# Patient Record
Sex: Female | Born: 1975 | Race: Black or African American | Hispanic: No | Marital: Single | Smoking: Never smoker
Health system: Southern US, Community
[De-identification: ages and names within clinical notes are randomized; demographics above are authoritative.]

## PROBLEM LIST (undated history)

## (undated) DIAGNOSIS — C801 Malignant (primary) neoplasm, unspecified: Secondary | ICD-10-CM

## (undated) DIAGNOSIS — D649 Anemia, unspecified: Secondary | ICD-10-CM

## (undated) HISTORY — PX: WISDOM TOOTH EXTRACTION: SHX21

## (undated) HISTORY — PX: THERAPEUTIC ABORTION: SHX798

## (undated) HISTORY — PX: BREAST BIOPSY: SHX20

---

## 1998-08-17 ENCOUNTER — Ambulatory Visit (HOSPITAL_COMMUNITY): Admission: RE | Admit: 1998-08-17 | Discharge: 1998-08-17 | Payer: Self-pay | Admitting: Dermatology

## 2002-03-11 ENCOUNTER — Other Ambulatory Visit: Admission: RE | Admit: 2002-03-11 | Discharge: 2002-03-11 | Payer: Self-pay | Admitting: Obstetrics and Gynecology

## 2003-03-14 ENCOUNTER — Other Ambulatory Visit: Admission: RE | Admit: 2003-03-14 | Discharge: 2003-03-14 | Payer: Self-pay | Admitting: Obstetrics and Gynecology

## 2003-08-19 ENCOUNTER — Inpatient Hospital Stay (HOSPITAL_COMMUNITY): Admission: AD | Admit: 2003-08-19 | Discharge: 2003-08-19 | Payer: Self-pay | Admitting: Obstetrics and Gynecology

## 2003-08-20 ENCOUNTER — Inpatient Hospital Stay (HOSPITAL_COMMUNITY): Admission: AD | Admit: 2003-08-20 | Discharge: 2003-08-20 | Payer: Self-pay | Admitting: Obstetrics & Gynecology

## 2003-10-19 ENCOUNTER — Inpatient Hospital Stay (HOSPITAL_COMMUNITY): Admission: AD | Admit: 2003-10-19 | Discharge: 2003-10-21 | Payer: Self-pay | Admitting: Obstetrics and Gynecology

## 2003-12-01 ENCOUNTER — Other Ambulatory Visit: Admission: RE | Admit: 2003-12-01 | Discharge: 2003-12-01 | Payer: Self-pay | Admitting: Obstetrics and Gynecology

## 2004-02-19 ENCOUNTER — Emergency Department (HOSPITAL_COMMUNITY): Admission: AD | Admit: 2004-02-19 | Discharge: 2004-02-19 | Payer: Self-pay | Admitting: Family Medicine

## 2005-11-18 ENCOUNTER — Ambulatory Visit: Payer: Self-pay | Admitting: Internal Medicine

## 2014-11-16 ENCOUNTER — Ambulatory Visit
Admission: RE | Admit: 2014-11-16 | Discharge: 2014-11-16 | Disposition: A | Discharge status: Home / Self Care | Payer: 59 | Source: Ambulatory Visit | Attending: Family Medicine | Admitting: Family Medicine

## 2014-11-16 ENCOUNTER — Other Ambulatory Visit: Payer: Self-pay | Admitting: Family Medicine

## 2014-11-16 DIAGNOSIS — M542 Cervicalgia: Secondary | ICD-10-CM

## 2014-11-16 DIAGNOSIS — M25512 Pain in left shoulder: Secondary | ICD-10-CM

## 2014-11-16 IMAGING — CR DG CERVICAL SPINE COMPLETE 4+V
6 series · 6 of 6 positions shown · non-contrast
Comparison: None.

CLINICAL DATA: Two weeks of neck and left arm pain and tingling
which began after workout at the [employer]

EXAM:
CERVICAL SPINE  4+ VIEWS

[view not recorded (1 of 6)]
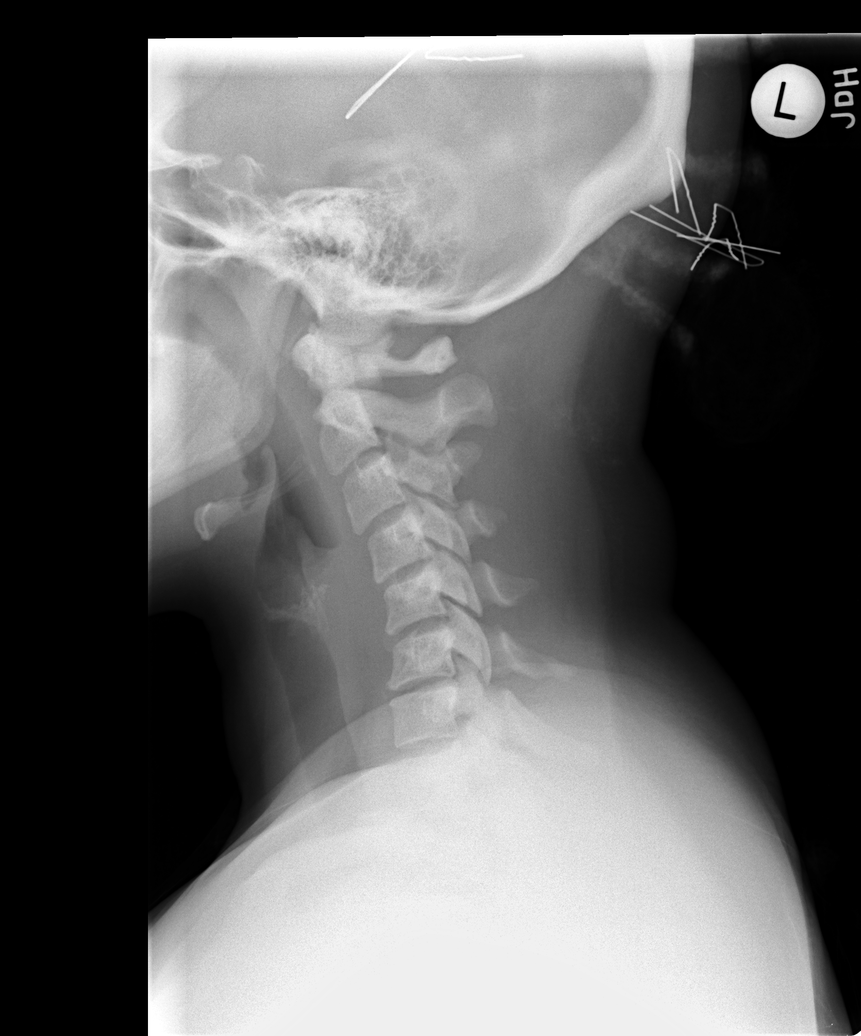

[view not recorded (2 of 6)]
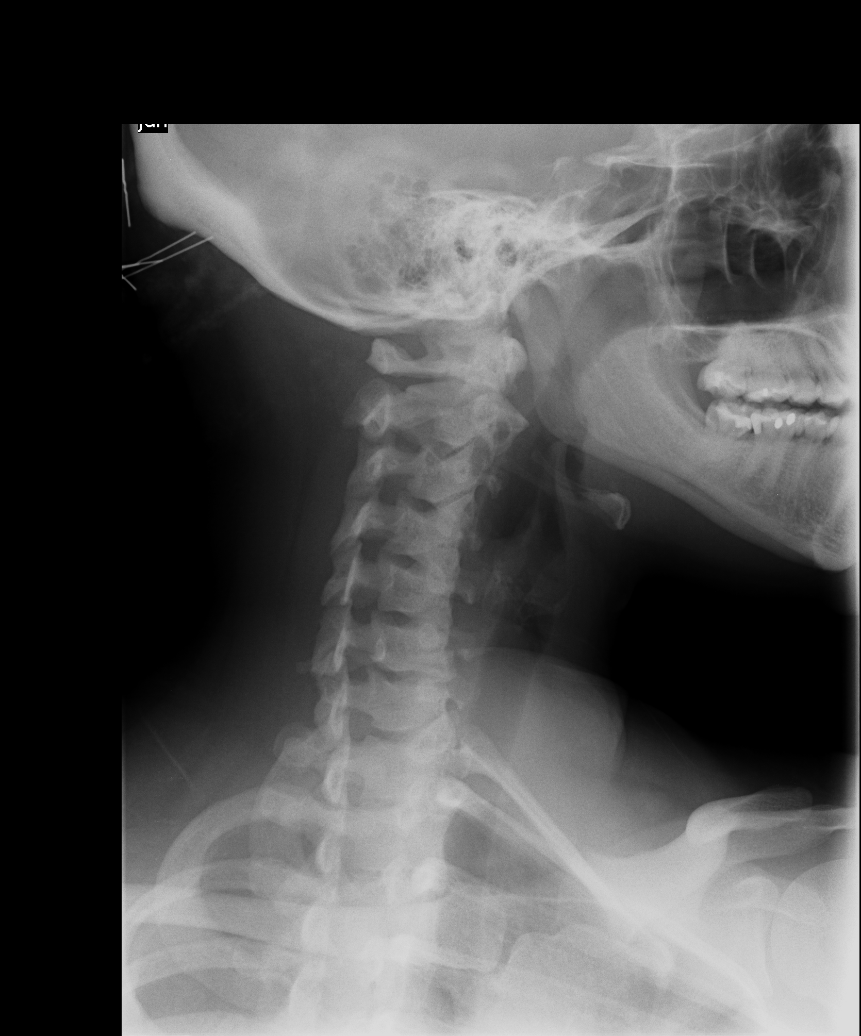

[view not recorded (3 of 6)]
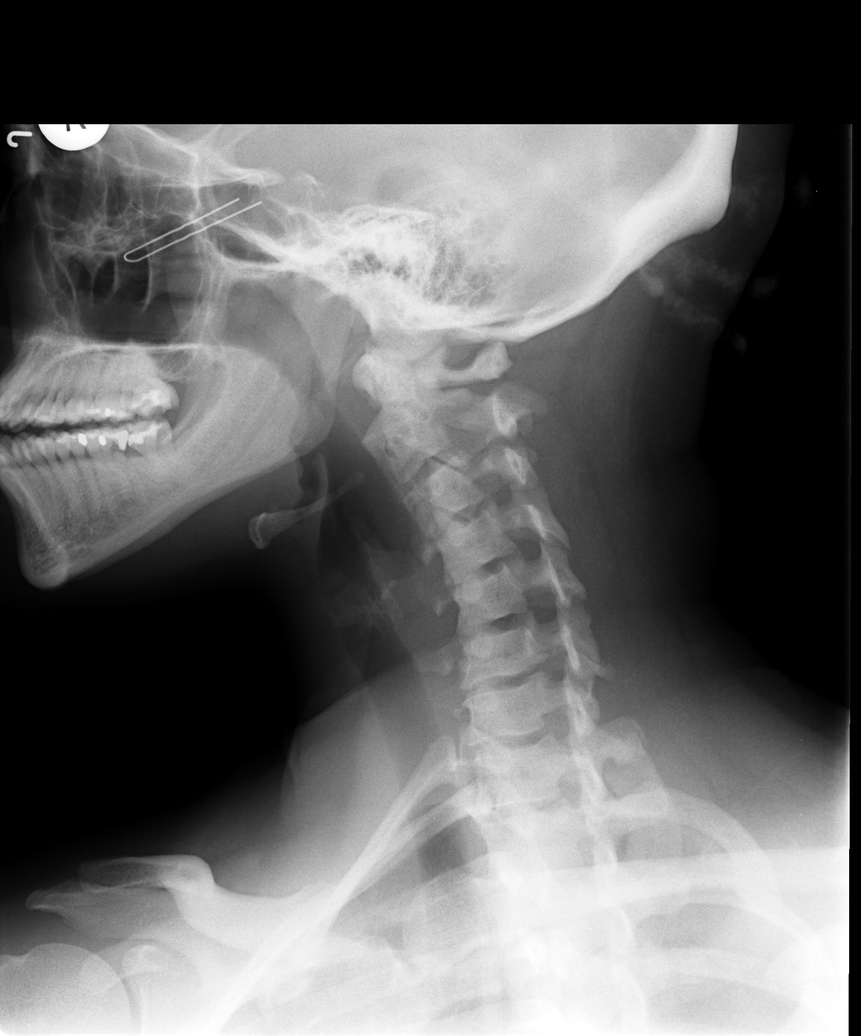

[view not recorded (4 of 6)]
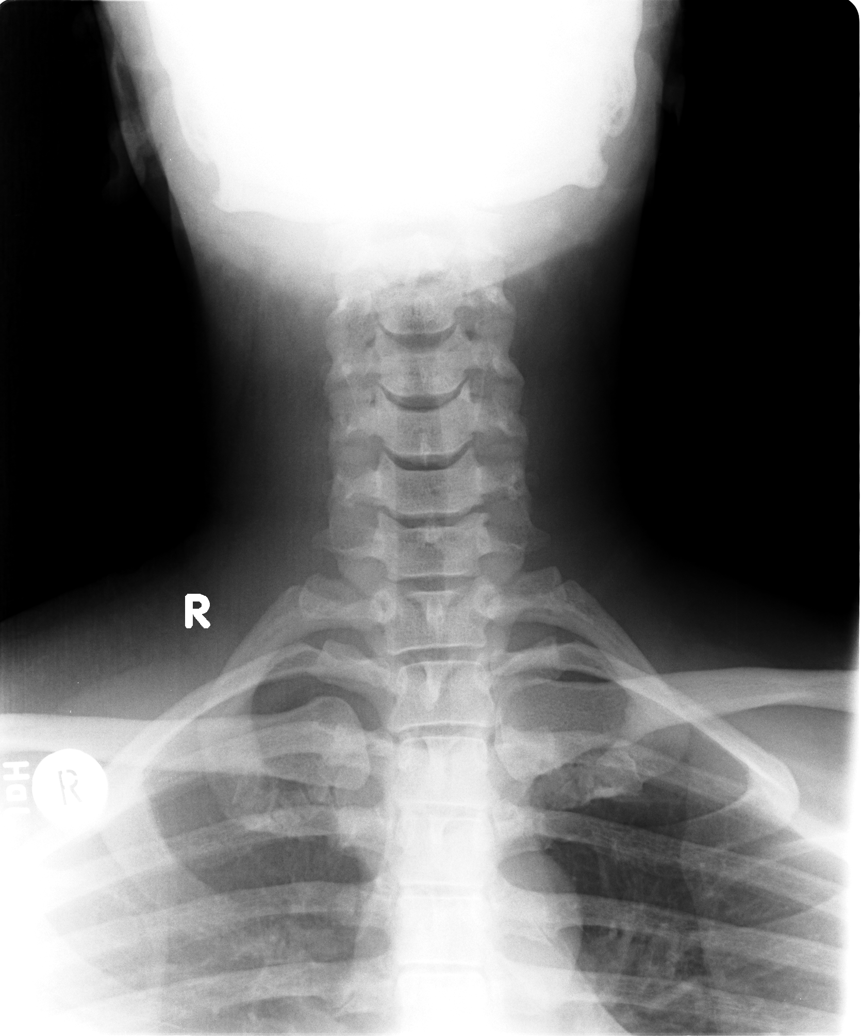

[view not recorded (5 of 6)]
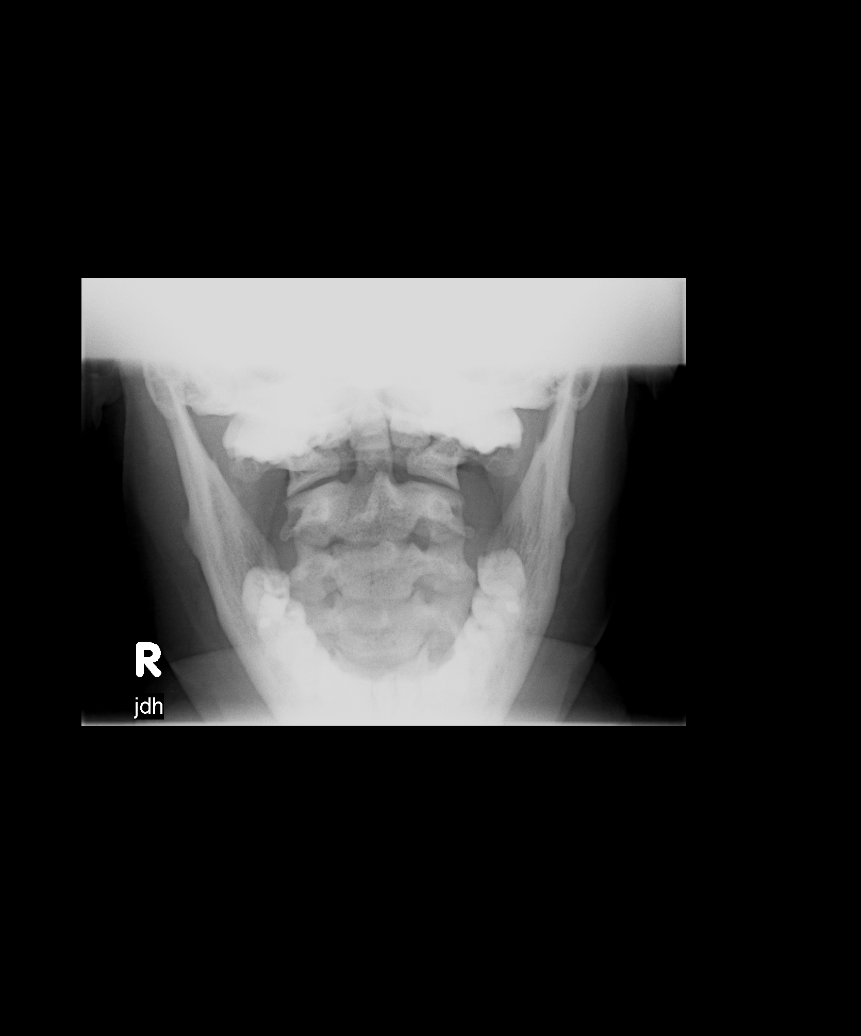

[view not recorded (6 of 6)]
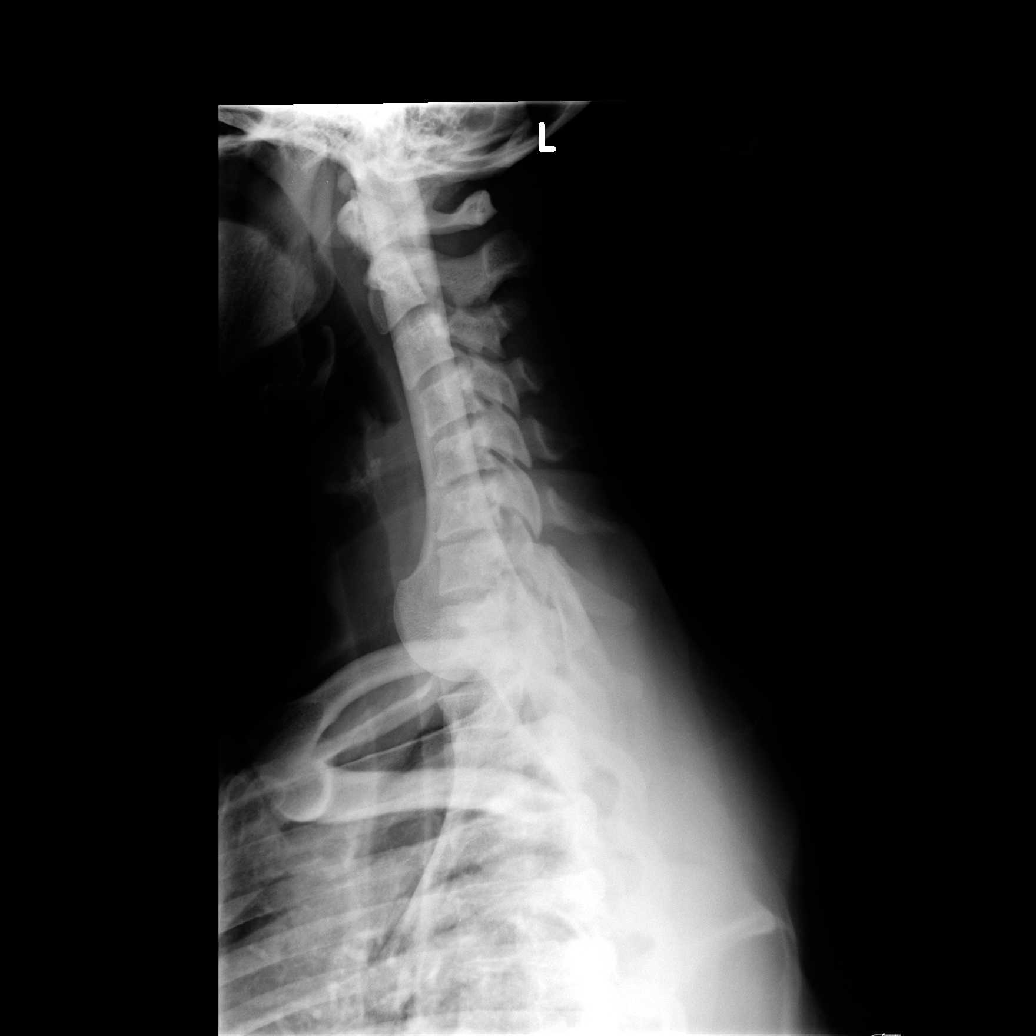

[6 of 6 positions shown; findings below may reference images not displayed]

FINDINGS: There is marked reversal of the normal cervical lordosis. The
vertebral bodies are reasonably well maintained in height. There is
no perched facet. There is no acute fracture. The prevertebral soft
tissue spaces are normal. The oblique views reveal no bony
encroachment upon the neural foramina. The odontoid is intact.
IMPRESSION: Reversal of the normal cervical lordosis is consistent with muscle
spasm. There is no acute bony abnormality. There is no high-grade
disc space narrowing.

## 2016-05-30 ENCOUNTER — Other Ambulatory Visit: Payer: Self-pay | Admitting: Obstetrics and Gynecology

## 2016-05-30 DIAGNOSIS — R928 Other abnormal and inconclusive findings on diagnostic imaging of breast: Secondary | ICD-10-CM

## 2016-06-07 ENCOUNTER — Ambulatory Visit
Admission: RE | Admit: 2016-06-07 | Discharge: 2016-06-07 | Disposition: A | Discharge status: Home / Self Care | Payer: Self-pay | Source: Ambulatory Visit | Attending: Obstetrics and Gynecology | Admitting: Obstetrics and Gynecology

## 2016-06-07 DIAGNOSIS — R928 Other abnormal and inconclusive findings on diagnostic imaging of breast: Secondary | ICD-10-CM

## 2016-06-07 IMAGING — US ULTRASOUND LEFT BREAST LIMITED
1 series · 6 of 6 positions shown · non-contrast
Comparison: [DATE]

CLINICAL DATA: The patient returns after baseline screening study
for evaluation of possible bilateral breast masses.

EXAM:
2D DIGITAL DIAGNOSTIC BILATERAL MAMMOGRAM WITH CAD AND ADJUNCT TOMO
ULTRASOUND BILATERAL BREAST

[Series 1: ultrasound left breast limited · 0.06mm/px · 6 of 6 slices shown]
[im 1/6]
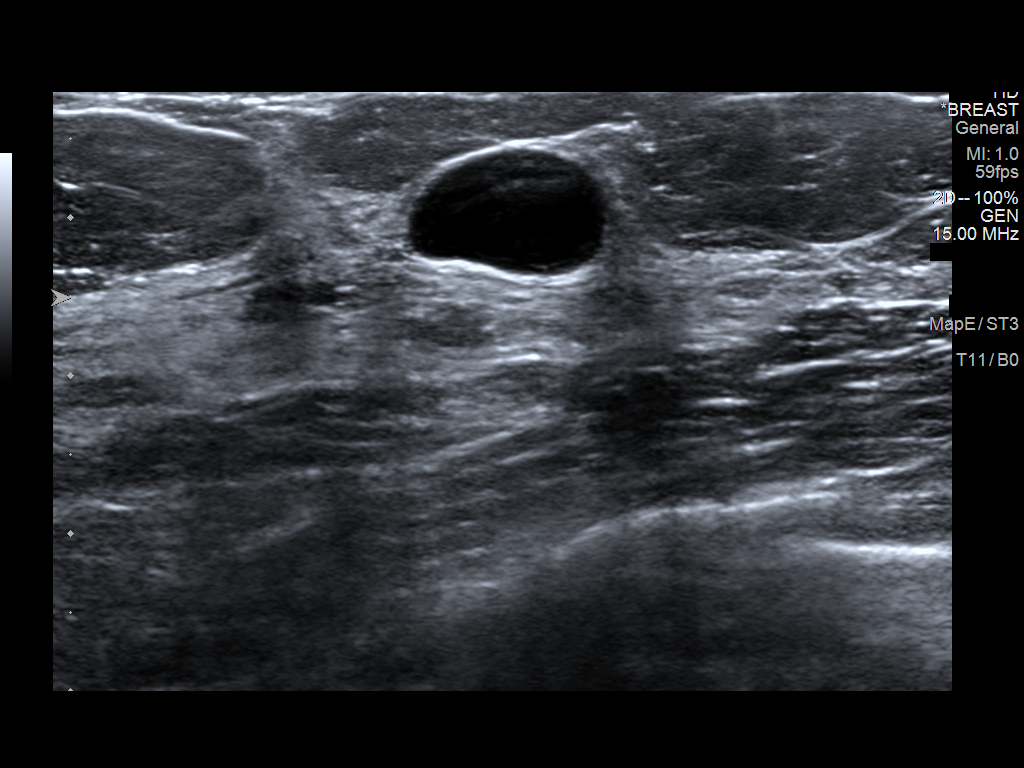
[im 2/6]
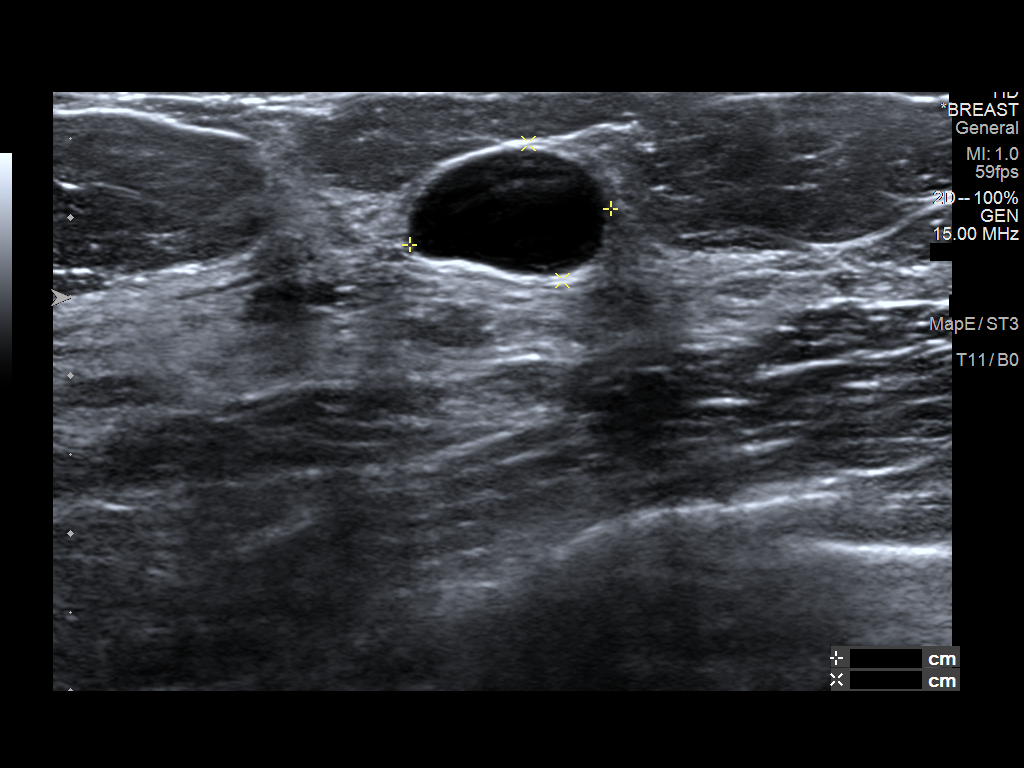
[im 3/6]
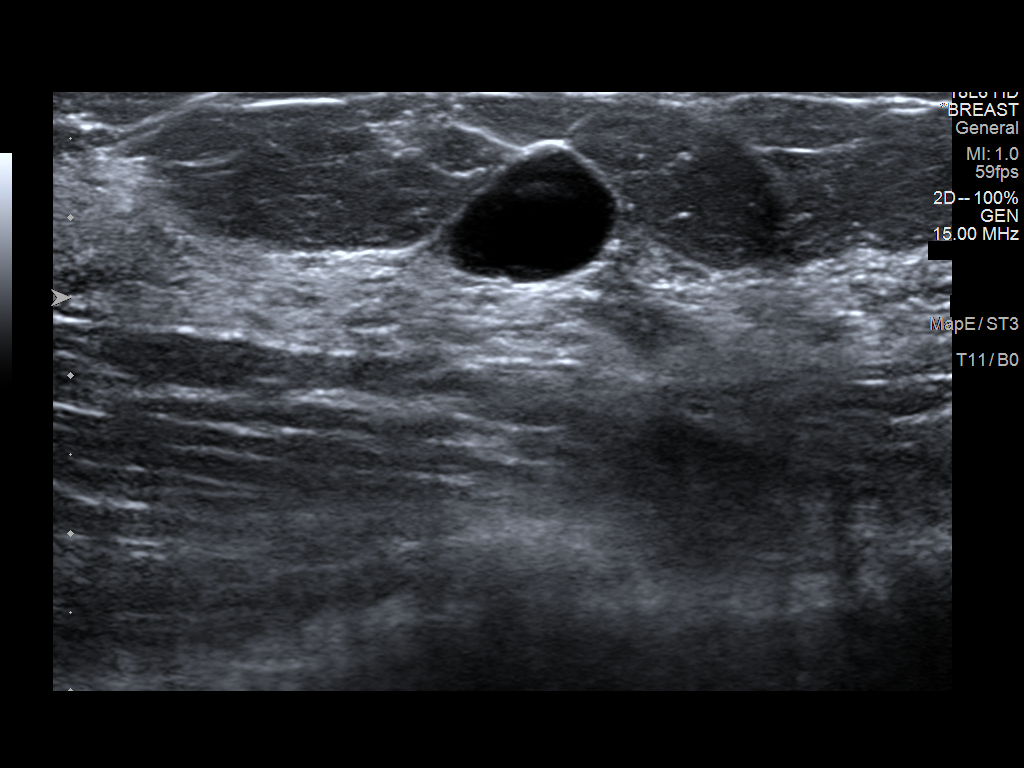
[im 4/6]
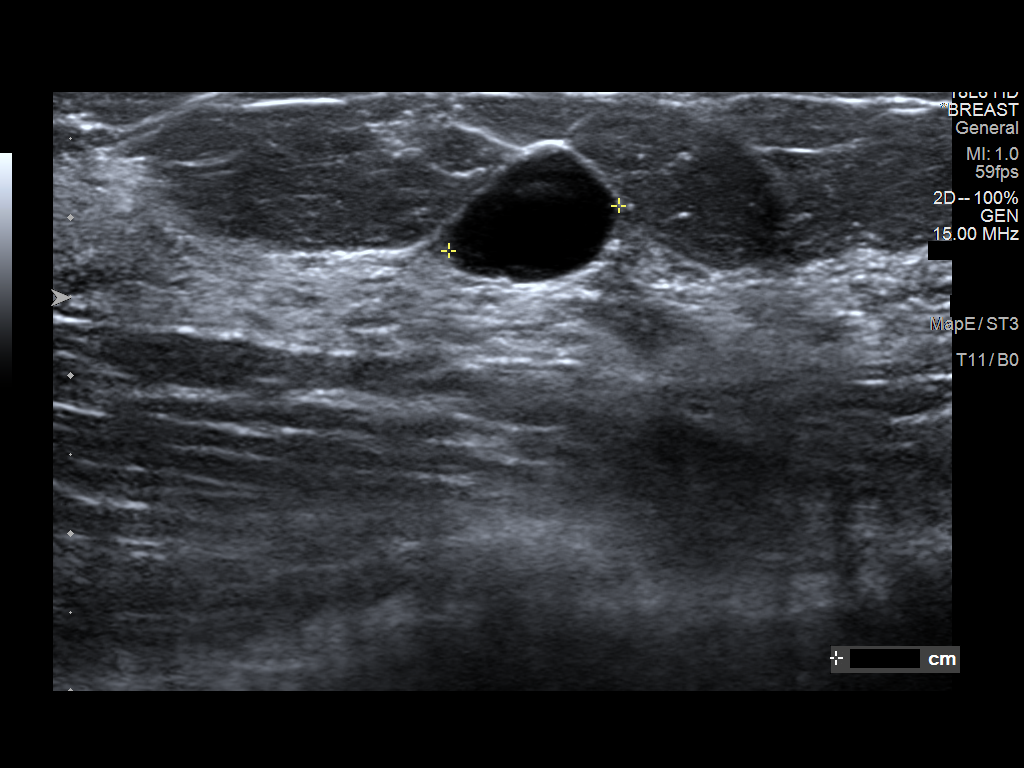
[im 5/6]
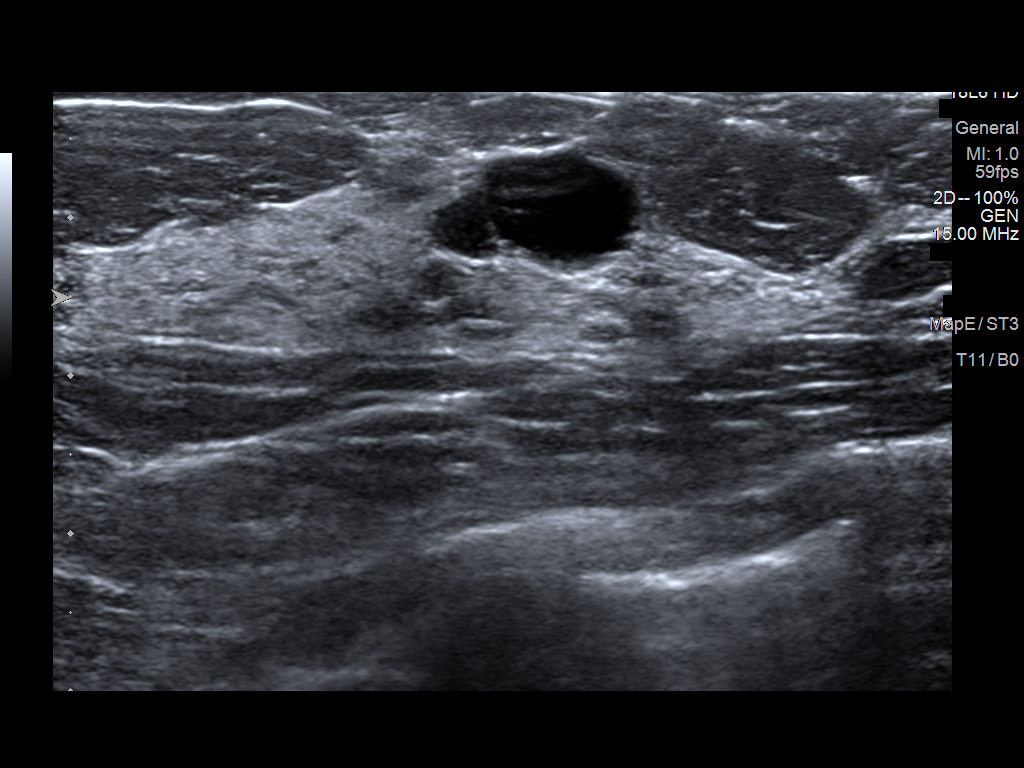
[im 6/6]
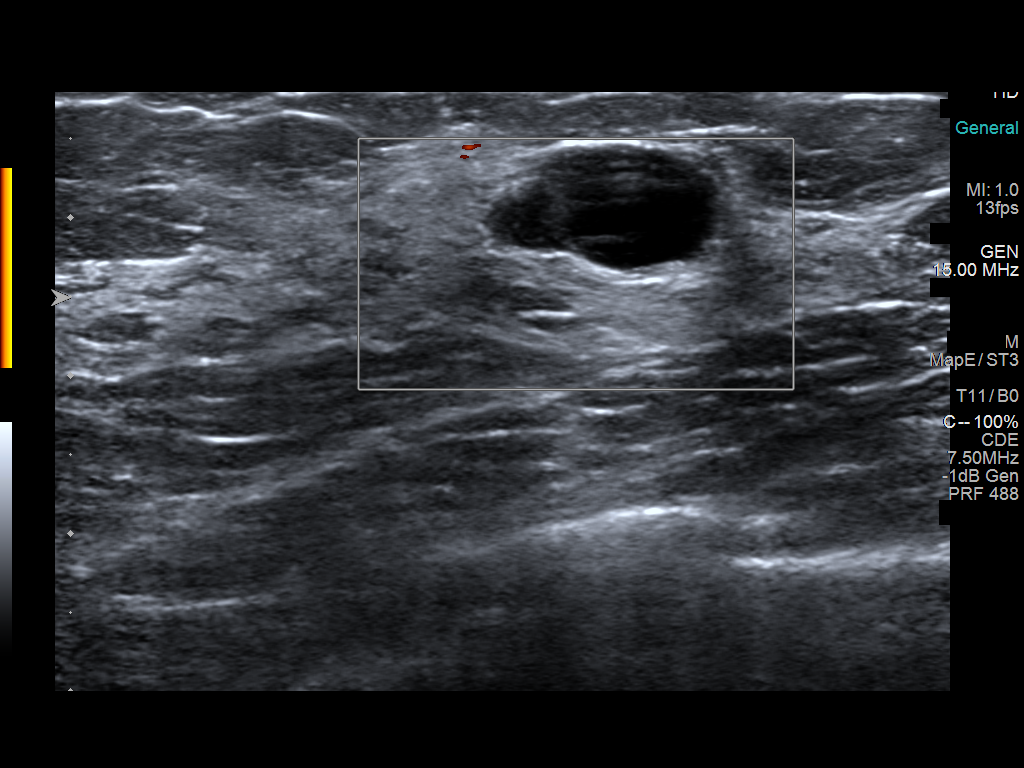

[6 of 6 positions shown; findings below may reference images not displayed]

ACR Breast Density Category c: The breast tissue is heterogeneously
dense, which may obscure small masses.
FINDINGS: Additional views are performed confirming presence of circumscribed
masses in the upper-outer quadrant of the right breast in the
lateral central portion of the left breast.

Mammographic images were processed with CAD.

On physical exam, I palpate no abnormality in the lateral aspect of
the right breast. I palpate no abnormality in the lateral aspect of
the left breast.

Targeted ultrasound is performed, showing a simple cyst in the 9
o'clock location of the right breast 4 cm from the nipple which
measures 1.0 x 0.7 x 0.8 cm. In the left breast 4 o'clock location 6
cm from nipple, a simple cyst is 1.1 by 1.3 x 0.9 cm. Single
internal septation is present. No solid mass or acoustic shadowing
identified in the areas evaluated.
IMPRESSION: Bilateral simple cysts. No mammographic or ultrasound evidence for
malignancy.

RECOMMENDATION:
Screening mammogram in one year.(Code:[E4])

I have discussed the findings and recommendations with the patient.
Results were also provided in writing at the conclusion of the
visit. If applicable, a reminder letter will be sent to the patient
regarding the next appointment.

BI-RADS CATEGORY  2: Benign.

## 2016-06-07 IMAGING — MG 2D DIGITAL DIAGNOSTIC BILATERAL MAMMOGRAM WITH CAD AND ADJUNCT T
8 of 12 series · 8 of 28 positions shown · non-contrast
Comparison: [DATE]

CLINICAL DATA: The patient returns after baseline screening study
for evaluation of possible bilateral breast masses.

EXAM:
2D DIGITAL DIAGNOSTIC BILATERAL MAMMOGRAM WITH CAD AND ADJUNCT TOMO
ULTRASOUND BILATERAL BREAST

[R MLO]
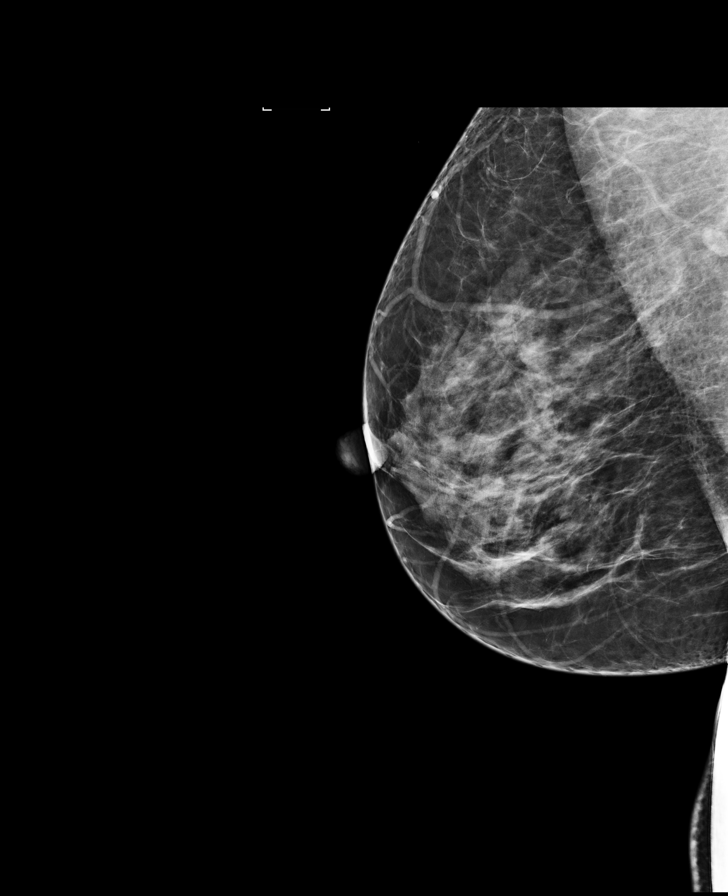

[R CC]
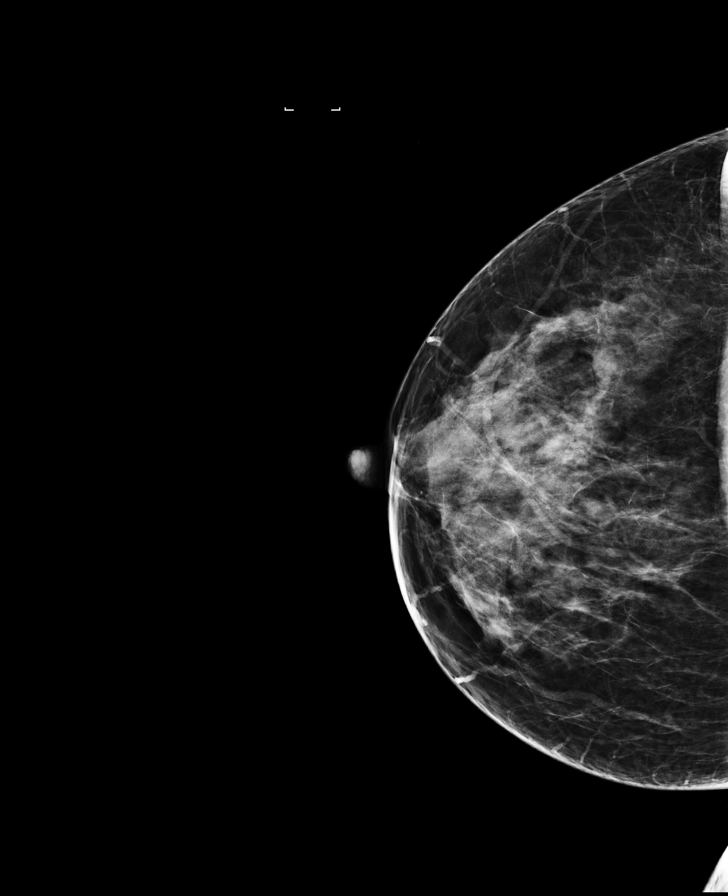

[R MLO synth-2D]
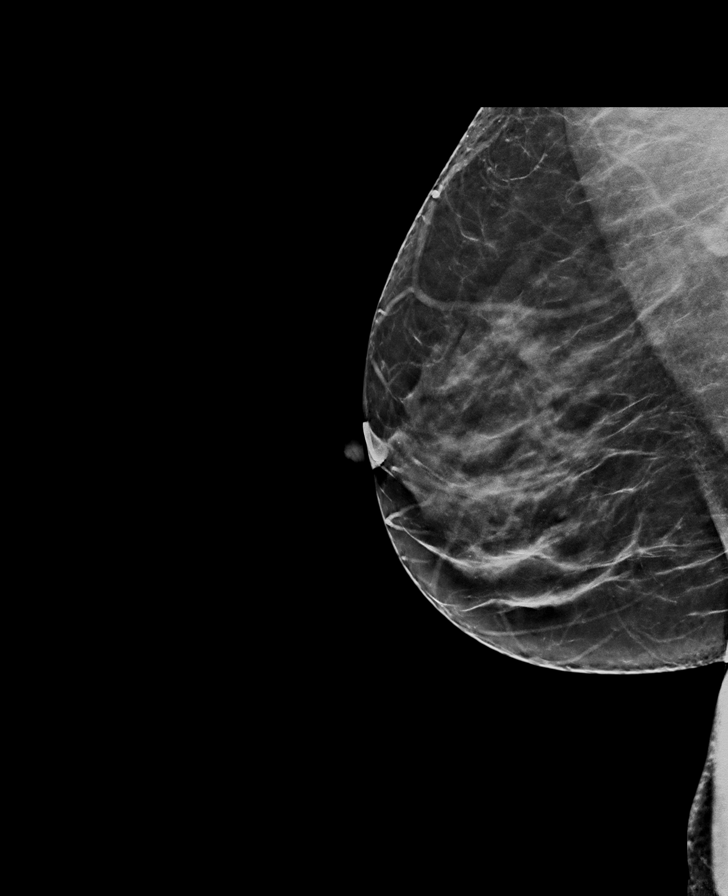

[R CC synth-2D]
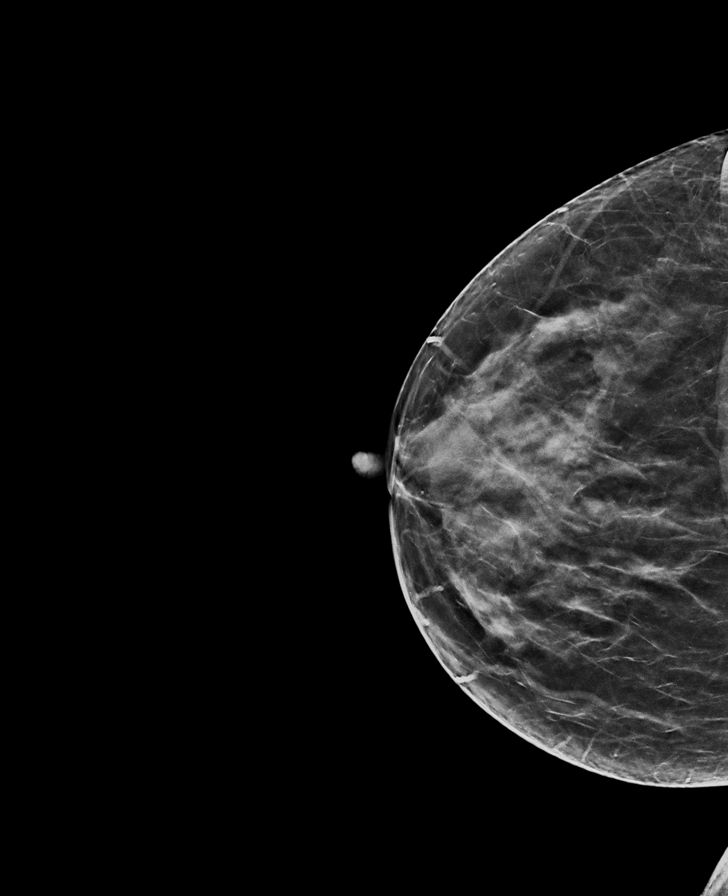

[L CC]
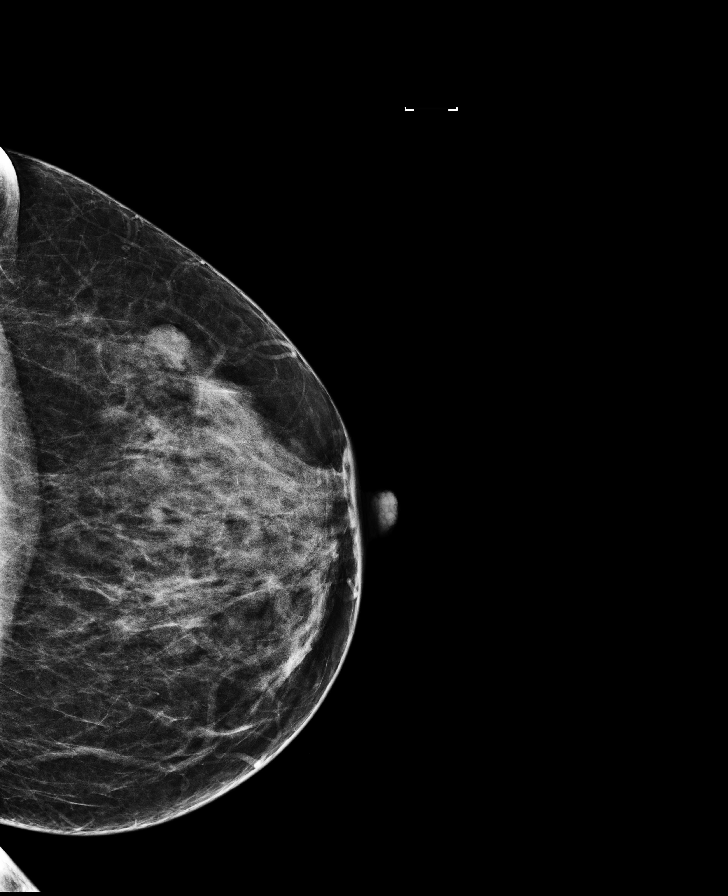

[L MLO]
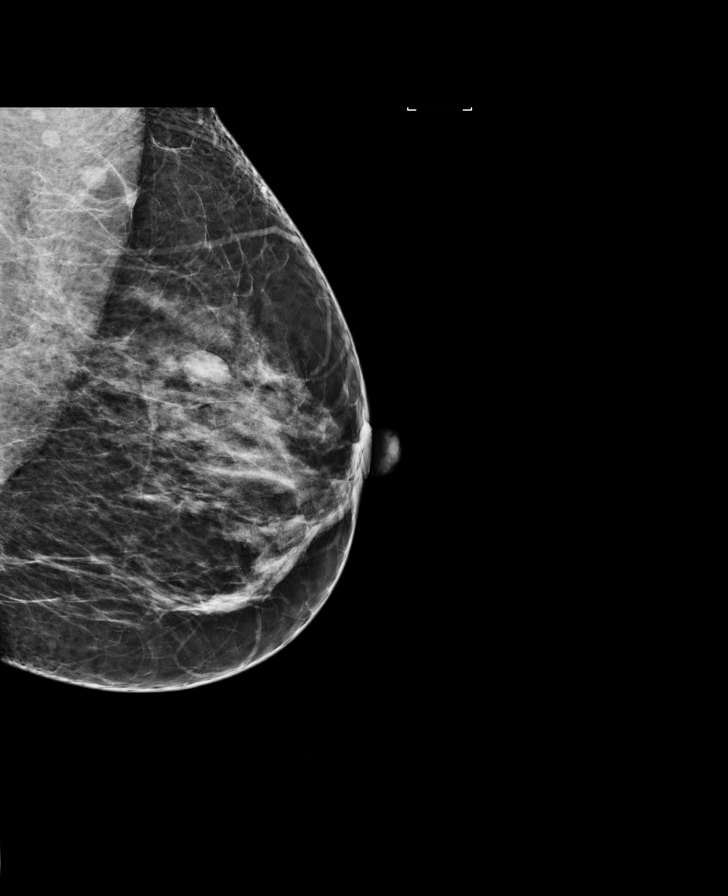

[L CC synth-2D]
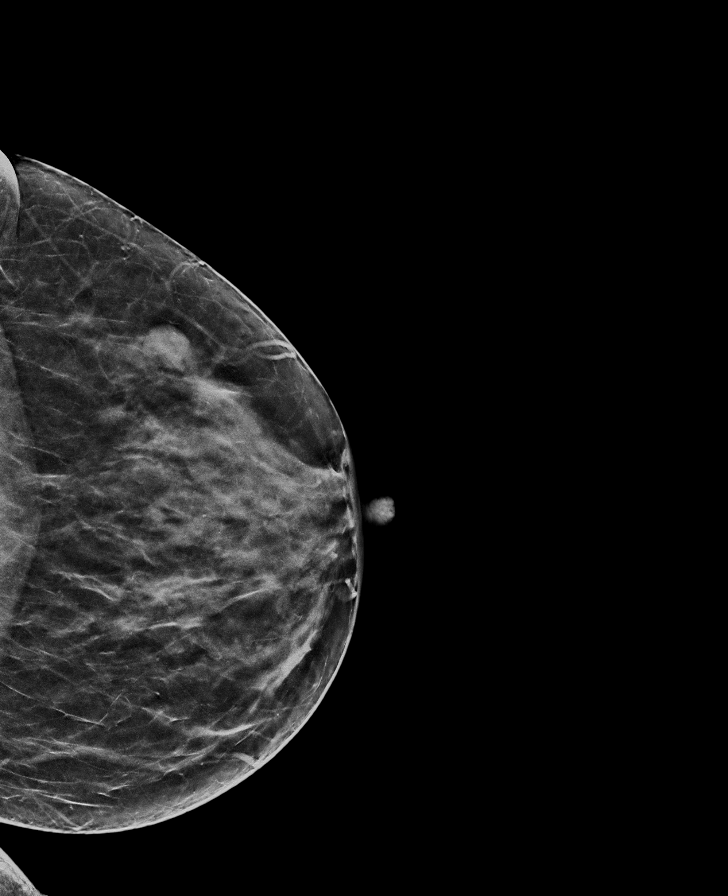

[L MLO synth-2D]
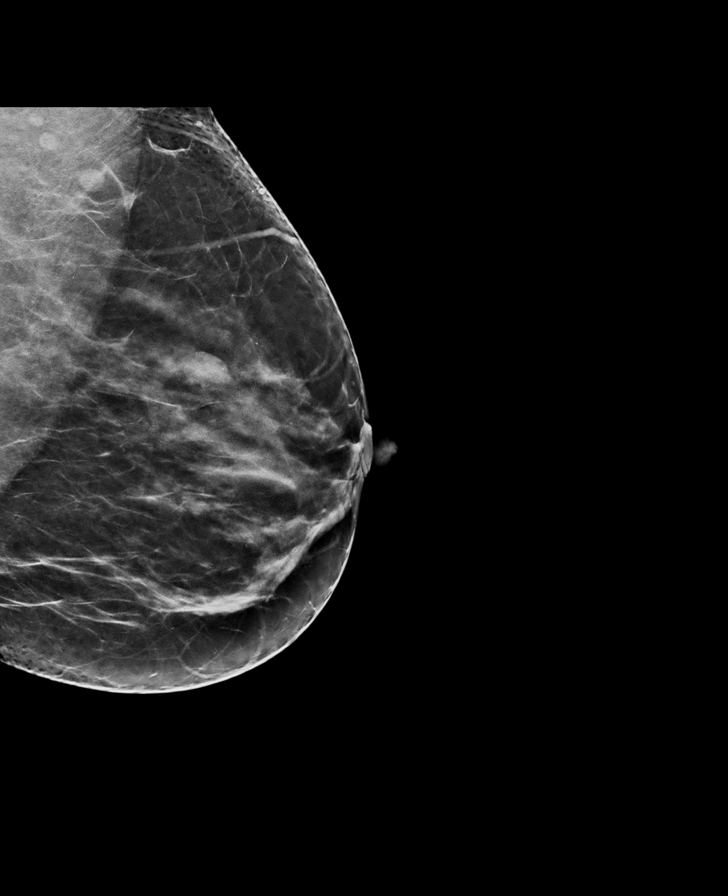

[8 of 28 positions shown; findings below may reference images not displayed]

ACR Breast Density Category c: The breast tissue is heterogeneously
dense, which may obscure small masses.
FINDINGS: Additional views are performed confirming presence of circumscribed
masses in the upper-outer quadrant of the right breast in the
lateral central portion of the left breast.

Mammographic images were processed with CAD.

On physical exam, I palpate no abnormality in the lateral aspect of
the right breast. I palpate no abnormality in the lateral aspect of
the left breast.

Targeted ultrasound is performed, showing a simple cyst in the 9
o'clock location of the right breast 4 cm from the nipple which
measures 1.0 x 0.7 x 0.8 cm. In the left breast 4 o'clock location 6
cm from nipple, a simple cyst is 1.1 by 1.3 x 0.9 cm. Single
internal septation is present. No solid mass or acoustic shadowing
identified in the areas evaluated.
IMPRESSION: Bilateral simple cysts. No mammographic or ultrasound evidence for
malignancy.

RECOMMENDATION:
Screening mammogram in one year.(Code:[E4])

I have discussed the findings and recommendations with the patient.
Results were also provided in writing at the conclusion of the
visit. If applicable, a reminder letter will be sent to the patient
regarding the next appointment.

BI-RADS CATEGORY  2: Benign.

## 2019-07-30 ENCOUNTER — Other Ambulatory Visit: Payer: Self-pay | Admitting: Obstetrics and Gynecology

## 2019-07-30 DIAGNOSIS — R928 Other abnormal and inconclusive findings on diagnostic imaging of breast: Secondary | ICD-10-CM

## 2019-08-05 ENCOUNTER — Other Ambulatory Visit: Payer: Self-pay

## 2019-08-05 ENCOUNTER — Ambulatory Visit
Admission: RE | Admit: 2019-08-05 | Discharge: 2019-08-05 | Disposition: A | Discharge status: Home / Self Care | Payer: Self-pay | Source: Ambulatory Visit | Attending: Obstetrics and Gynecology | Admitting: Obstetrics and Gynecology

## 2019-08-05 DIAGNOSIS — R928 Other abnormal and inconclusive findings on diagnostic imaging of breast: Secondary | ICD-10-CM

## 2019-08-05 IMAGING — US ULTRASOUND LEFT BREAST LIMITED
1 series · 5 of 5 positions shown · non-contrast
Comparison: Previous exam(s).

CLINICAL DATA: Screening recall for a possible left breast mass.

EXAM:
ULTRASOUND OF THE LEFT BREAST

[Series 1: ultrasound left breast limited · 0.07mm/px · 5 of 5 slices shown]
[im 1/5]
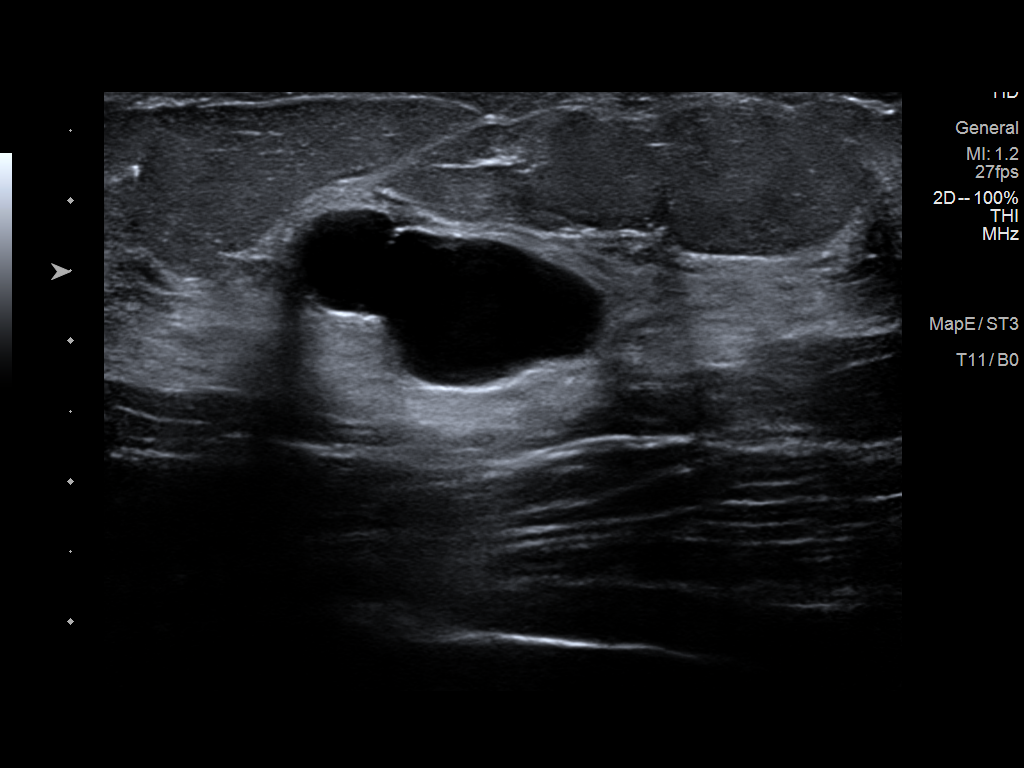
[im 2/5]
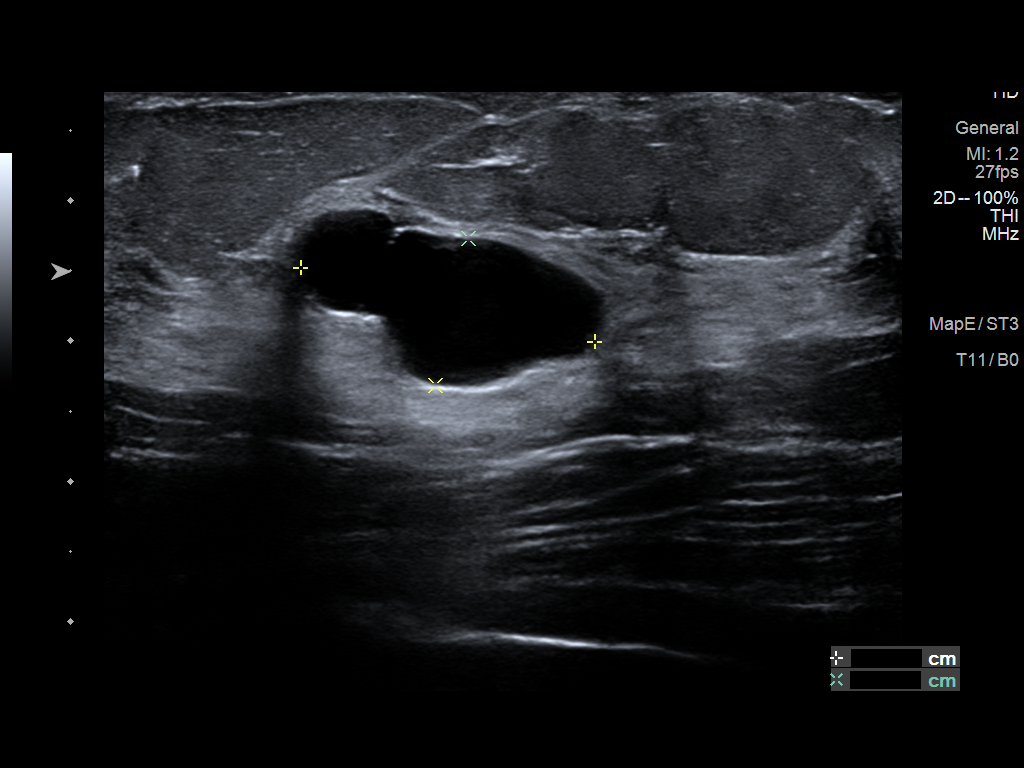
[im 3/5]
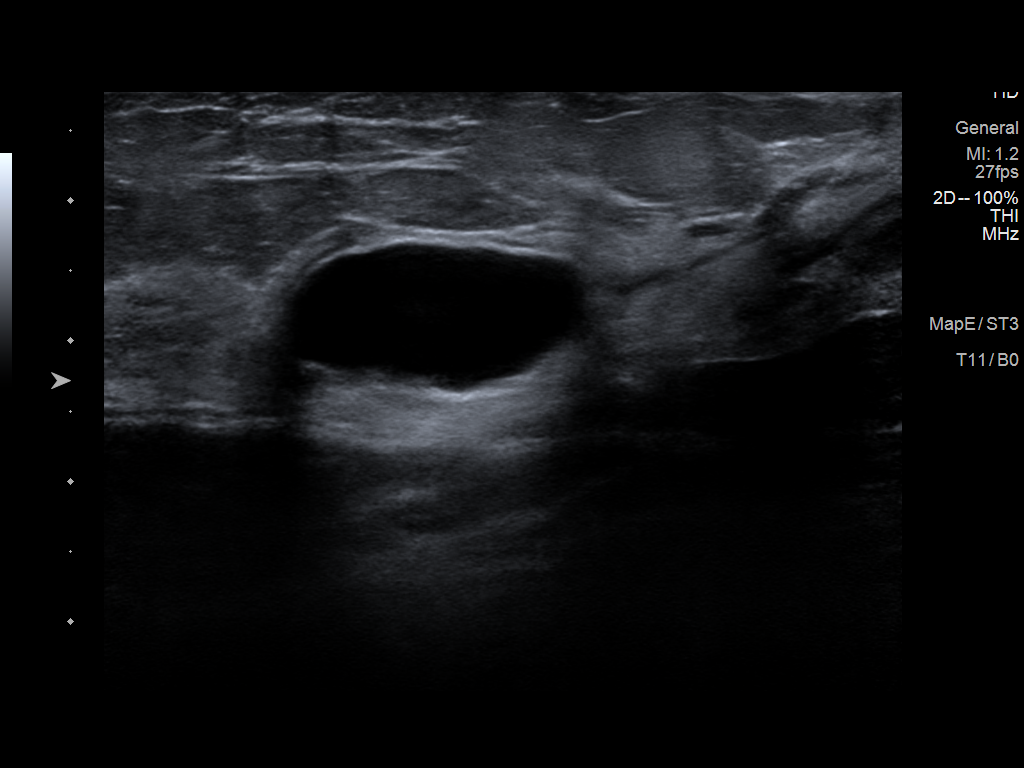
[im 4/5]
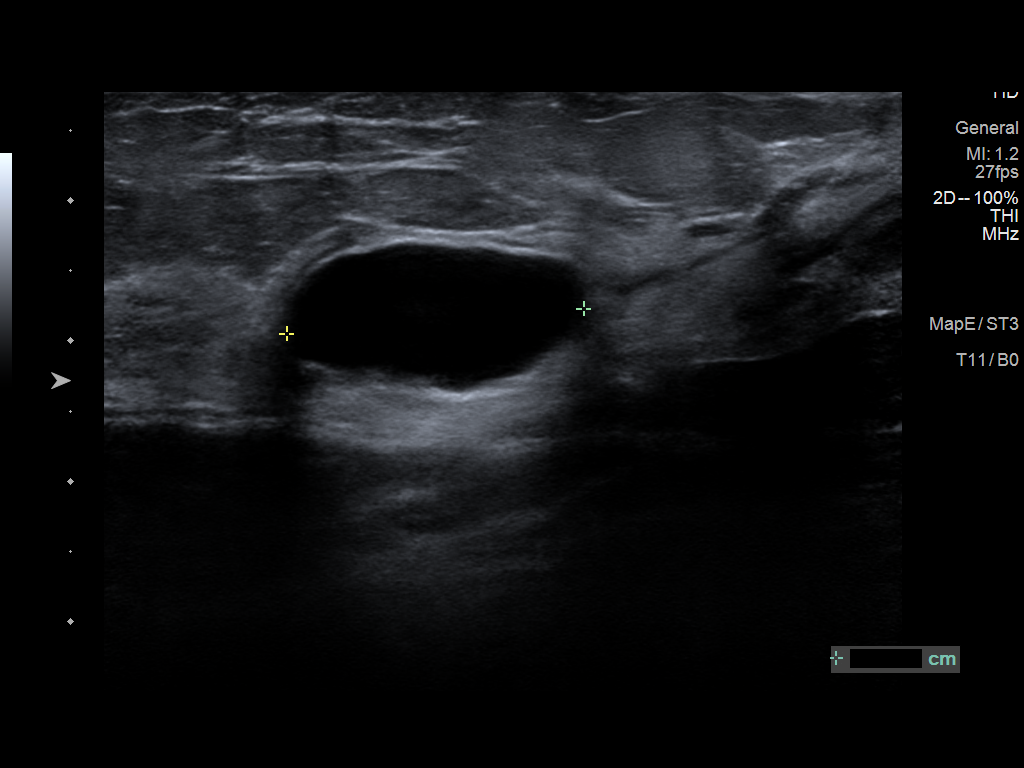
[im 5/5]
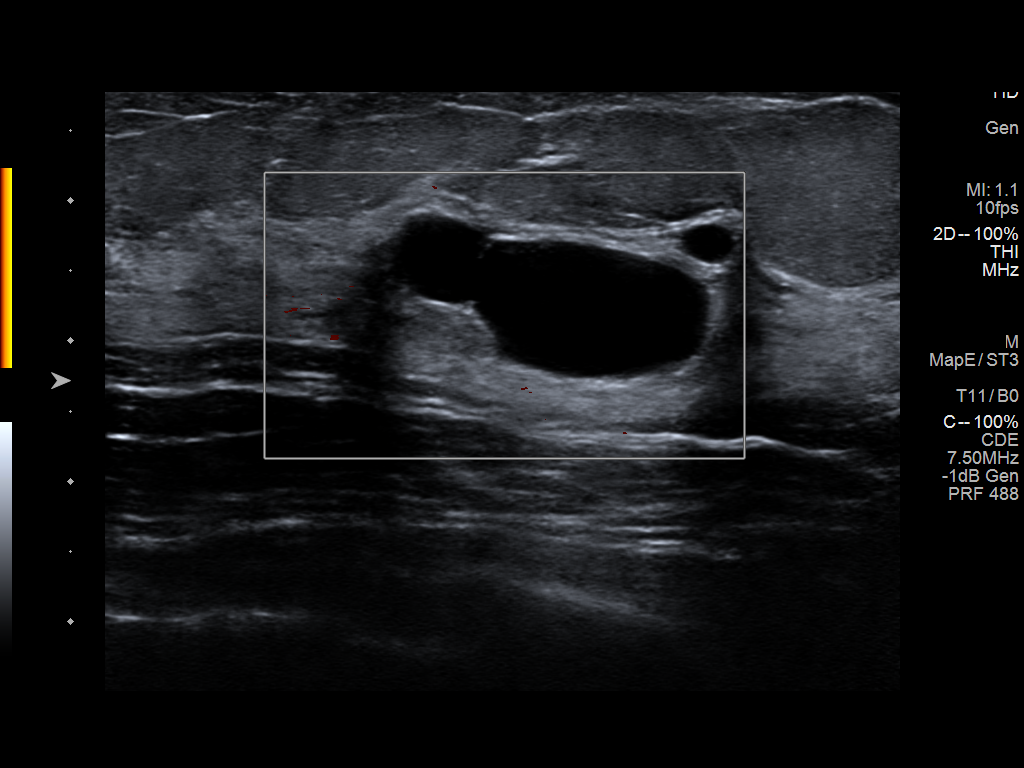

[5 of 5 positions shown; findings below may reference images not displayed]

FINDINGS: Targeted ultrasound is performed, showing a simple oval mildly
lobulated cyst in the left breast at 11:30 o'clock, 3 cm the nipple,
measuring 2.2 x 1.1 x 2.1 cm. This accounts for the mammographic
mass. There are no solid masses or suspicious lesions.
IMPRESSION: 1. No evidence of breast malignancy.
2. Benign left breast cyst.

RECOMMENDATION:
Screening mammogram in one year.(Code:[9G])

I have discussed the findings and recommendations with the patient.
Results were also provided in writing at the conclusion of the
visit. If applicable, a reminder letter will be sent to the patient
regarding the next appointment.

BI-RADS CATEGORY  2: Benign.

## 2019-10-28 DIAGNOSIS — N871 Moderate cervical dysplasia: Secondary | ICD-10-CM | POA: Insufficient documentation

## 2020-03-31 ENCOUNTER — Ambulatory Visit: Payer: BC Managed Care – PPO | Attending: Internal Medicine

## 2020-03-31 DIAGNOSIS — Z23 Encounter for immunization: Secondary | ICD-10-CM

## 2020-03-31 NOTE — Progress Notes (Signed)
   Covid-19 Vaccination Clinic  Name:  Tonya Blair    MRN: [id number] DOB: 04/19/1976  03/31/2020  [name] was observed post Covid-19 immunization for 15 minutes without incident. She was provided with Vaccine Information Sheet and instruction to access the V-Safe system.   [name] was instructed to call 911 with any severe reactions post vaccine: Marland Kitchen Difficulty breathing  . Swelling of face and throat  . A fast heartbeat  . A bad rash all over body  . Dizziness and weakness   Immunizations Administered    Name Date Dose VIS Date Route   Pfizer COVID-19 Vaccine 03/31/2020  1:26 PM 0.3 mL 12/03/2019 Intramuscular   Manufacturer: [place]   Lot: [id number]   [place]: [id number]

## 2020-04-24 ENCOUNTER — Ambulatory Visit: Payer: BC Managed Care – PPO | Attending: Internal Medicine

## 2020-04-24 DIAGNOSIS — Z23 Encounter for immunization: Secondary | ICD-10-CM

## 2020-04-24 NOTE — Progress Notes (Signed)
   Covid-19 Vaccination Clinic  Name:  Tonya Blair    MRN: [id number] DOB: Jan 15, 1976  04/24/2020  [name] was observed post Covid-19 immunization for 15 minutes without incident. She was provided with Vaccine Information Sheet and instruction to access the V-Safe system.   [name] was instructed to call 911 with any severe reactions post vaccine: Marland Kitchen Difficulty breathing  . Swelling of face and throat  . A fast heartbeat  . A bad rash all over body  . Dizziness and weakness   Immunizations Administered    Name Date Dose VIS Date Route   Pfizer COVID-19 Vaccine 04/24/2020  4:56 PM 0.3 mL 02/16/2019 Intramuscular   Manufacturer: [place]   Lot: [id number]   [place]: [id number]

## 2021-09-21 ENCOUNTER — Other Ambulatory Visit: Payer: Self-pay | Admitting: Obstetrics

## 2021-09-21 DIAGNOSIS — R928 Other abnormal and inconclusive findings on diagnostic imaging of breast: Secondary | ICD-10-CM

## 2021-12-11 ENCOUNTER — Ambulatory Visit
Admission: RE | Admit: 2021-12-11 | Discharge: 2021-12-11 | Disposition: A | Discharge status: Home / Self Care | Payer: BC Managed Care – PPO | Source: Ambulatory Visit | Attending: Obstetrics | Admitting: Obstetrics

## 2021-12-11 ENCOUNTER — Other Ambulatory Visit: Payer: Self-pay | Admitting: Obstetrics

## 2021-12-11 DIAGNOSIS — R928 Other abnormal and inconclusive findings on diagnostic imaging of breast: Secondary | ICD-10-CM

## 2021-12-11 DIAGNOSIS — N6310 Unspecified lump in the right breast, unspecified quadrant: Secondary | ICD-10-CM

## 2021-12-11 IMAGING — US US BREAST*R* LIMITED INC AXILLA
1 series · 9 of 9 positions shown · non-contrast
Comparison: Previous exam(s).

CLINICAL DATA: Patient was recalled from screening mammogram for a
right breast mass.

EXAM:
DIGITAL DIAGNOSTIC UNILATERAL RIGHT MAMMOGRAM WITH TOMOSYNTHESIS AND
CAD; ULTRASOUND RIGHT BREAST LIMITED
TECHNIQUE: Right digital diagnostic mammography and breast tomosynthesis was
performed. The images were evaluated with computer-aided detection.;
Targeted ultrasound examination of the right breast was performed

[Series 1: us breast*right* limited inc axilla · 0.06mm/px · 9 acquisitions, 9 frames shown]
[im 1/9]
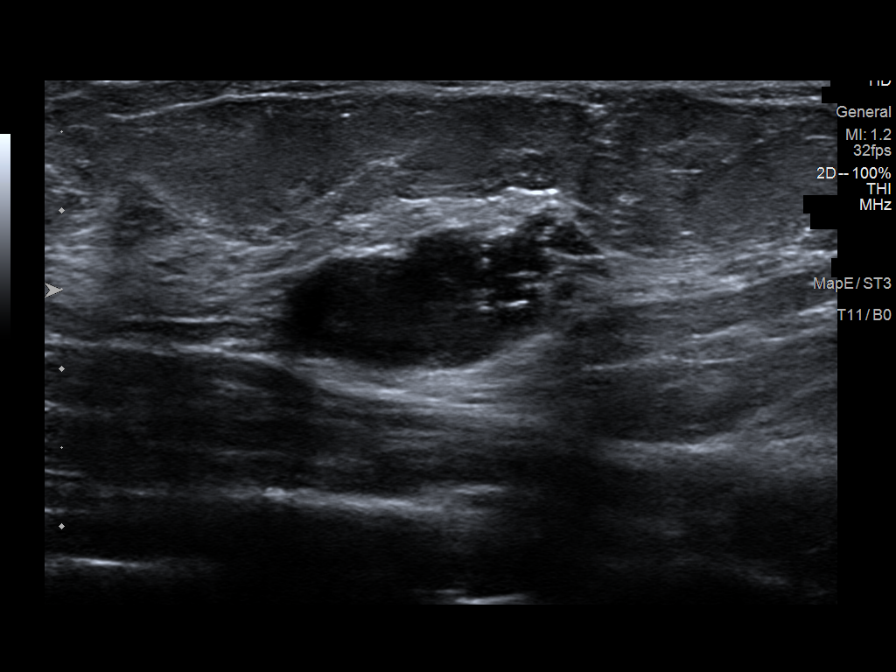
[im 2/9]
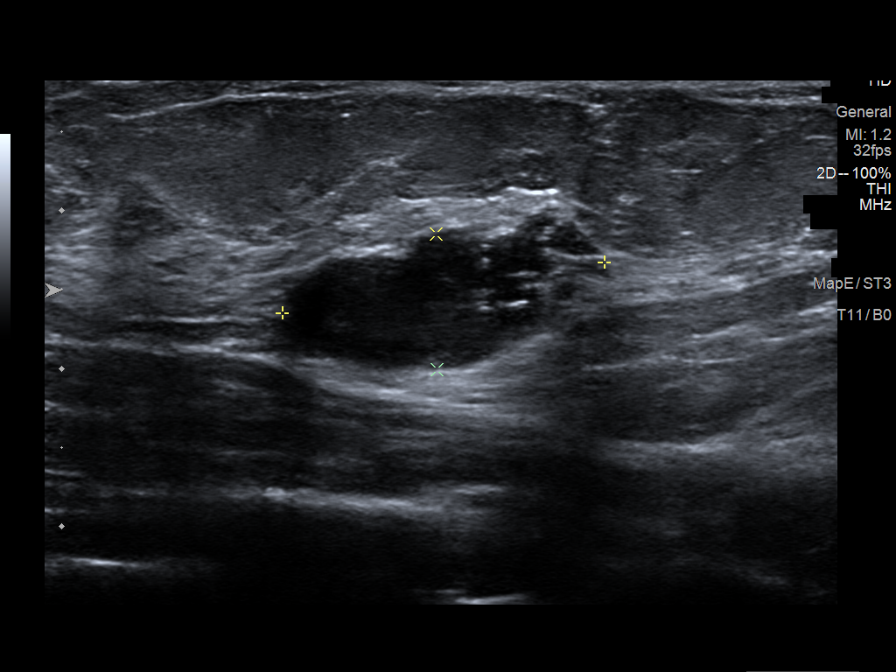
[im 3/9]
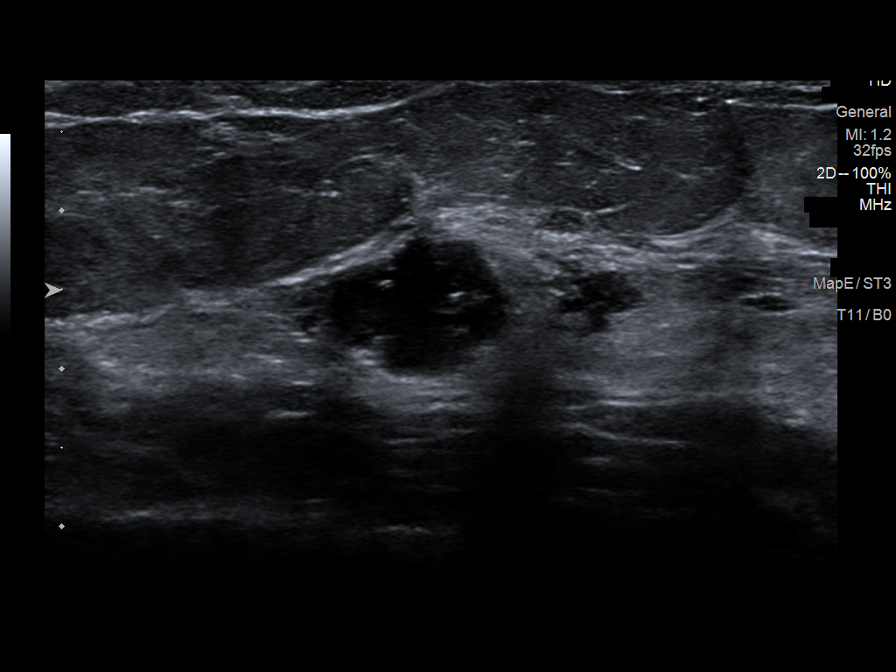
[im 4/9]
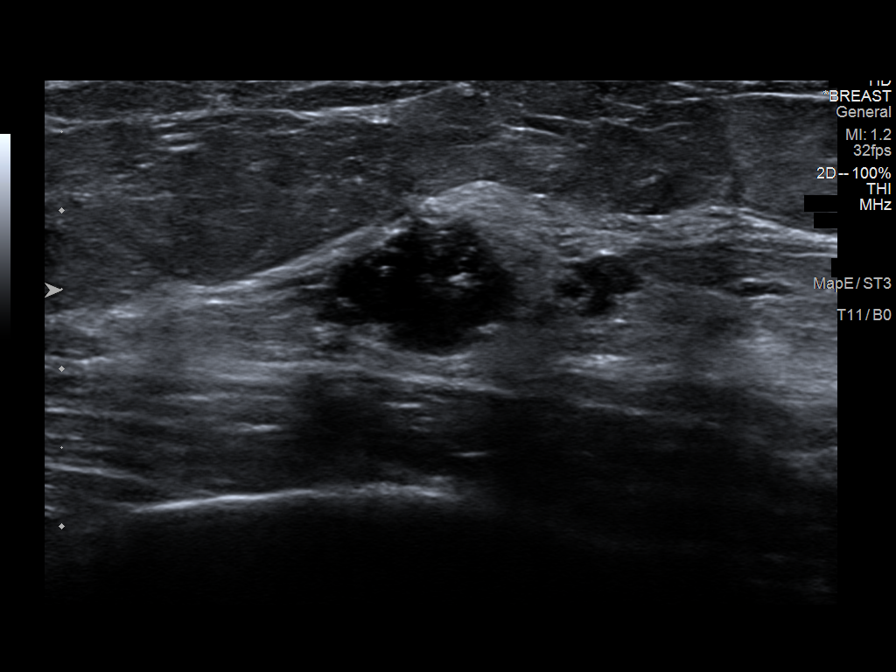
[im 5/9]
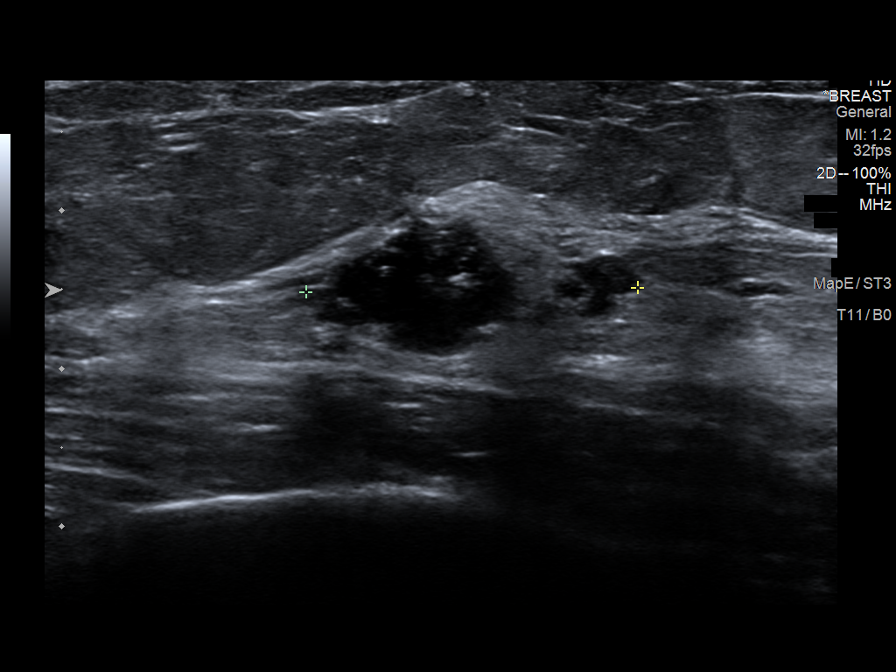
[im 6/9]
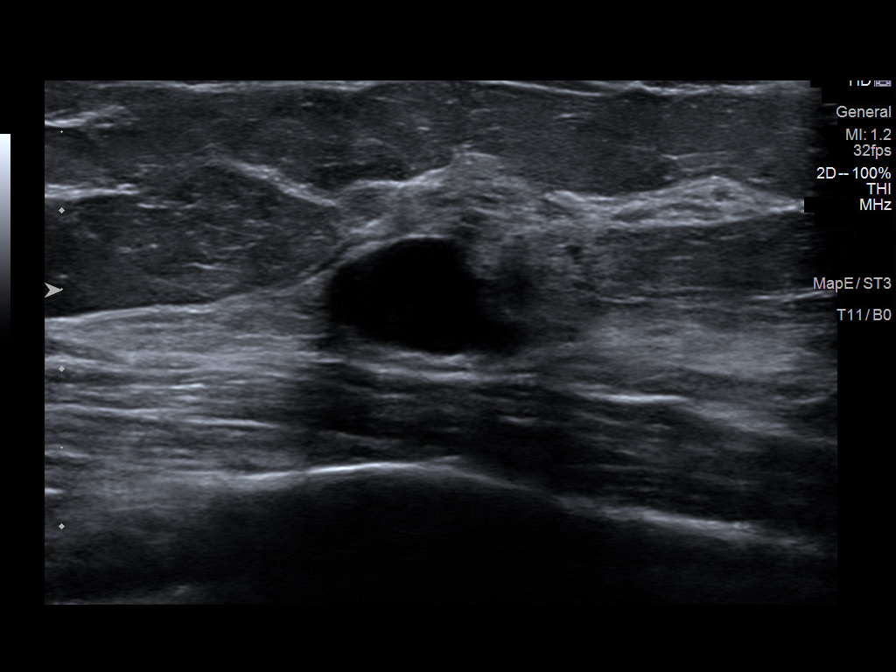
[im 7/9]
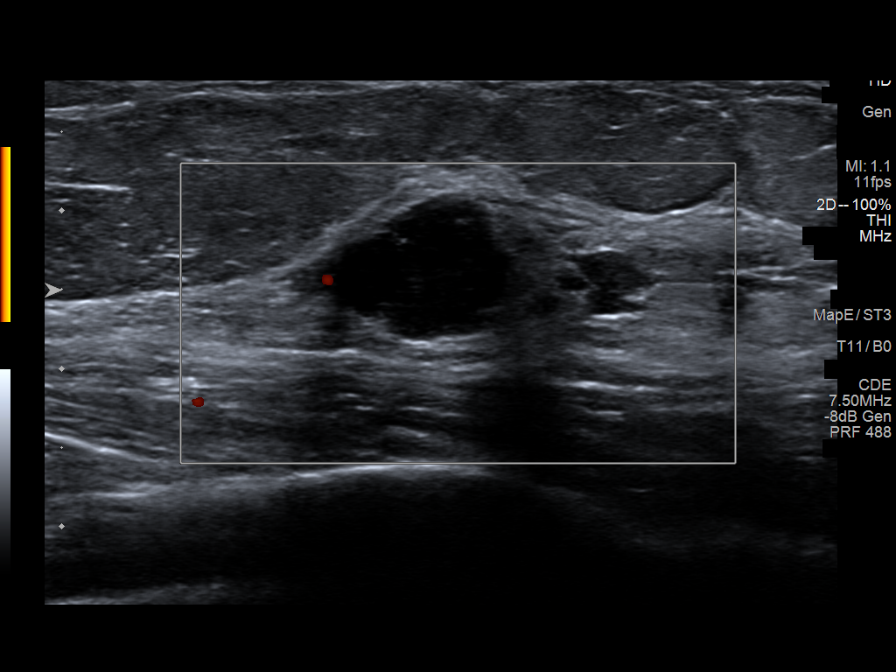
[im 8/9]
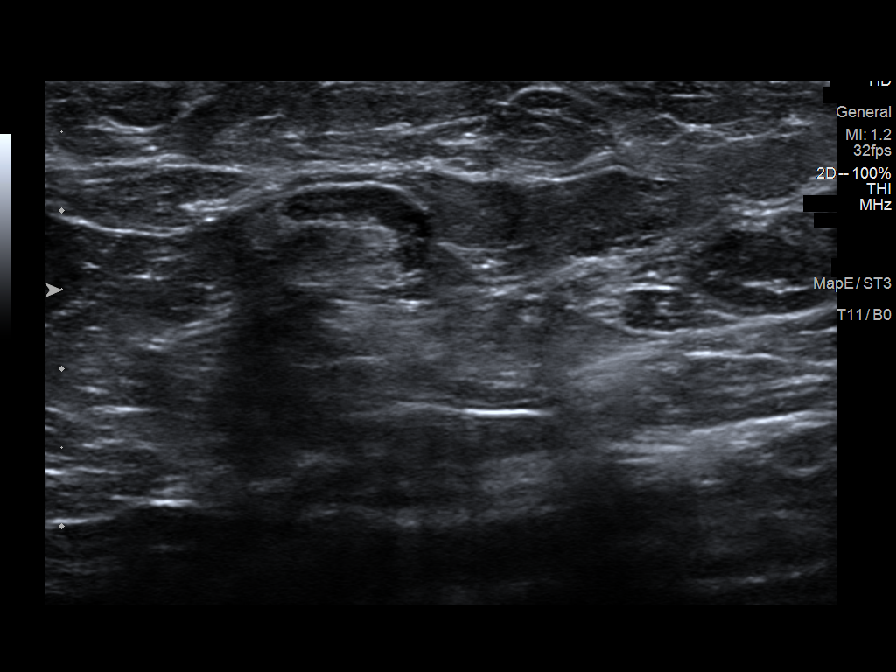
[im 9/9]
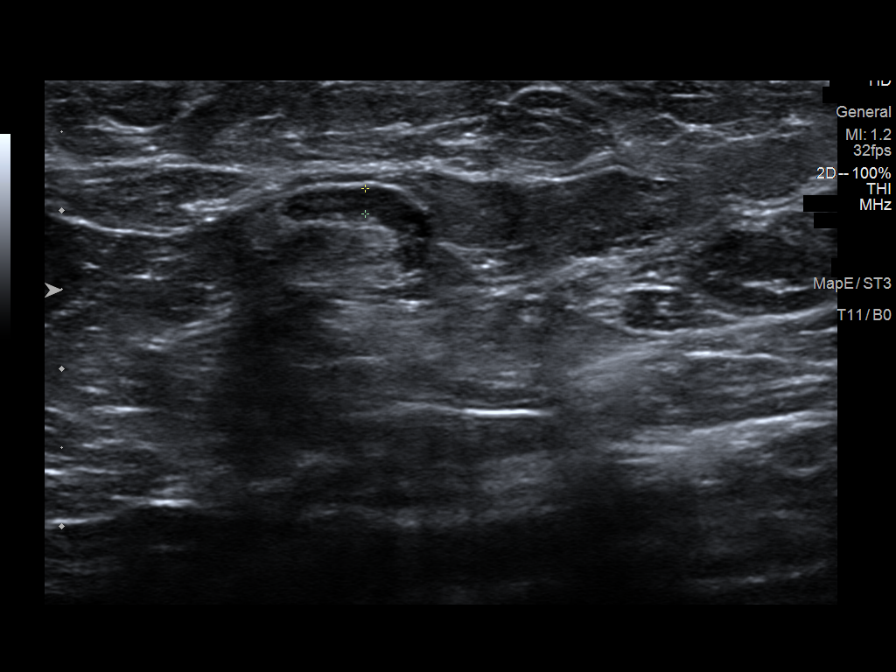

[9 of 9 positions shown; findings below may reference images not displayed]

ACR Breast Density Category c: The breast tissue is heterogeneously
dense, which may obscure small masses.
FINDINGS: Additional imaging of the right breast was performed. There is
persistence of a 1.6 cm mass in the lateral aspect of the breast
associated with coarse heterogeneous calcifications.

Targeted ultrasound is performed, showing an irregular hypoechoic
mass in the right breast at 9 o'clock 5 cm from the nipple measuring
2.1 x 0.9 x 2.1 cm. Sonographic evaluation the right axilla does not
show any enlarged adenopathy.
IMPRESSION: Suspicious mass in the 9 o'clock region of the right breast.

RECOMMENDATION:
Ultrasound-guided core biopsy of the mass in the 9 o'clock region of
the right breast is recommended. The biopsy will be scheduled at the
patient's convenience.

I have discussed the findings and recommendations with the patient.
If applicable, a reminder letter will be sent to the patient
regarding the next appointment.

BI-RADS CATEGORY  5: Highly suggestive of malignancy.

## 2021-12-11 IMAGING — MG MM DIGITAL DIAGNOSTIC UNILAT*R* W/ TOMO W/ CAD
4 series · 4 of 12 positions shown · non-contrast
Comparison: Previous exam(s).

CLINICAL DATA: Patient was recalled from screening mammogram for a
right breast mass.

EXAM:
DIGITAL DIAGNOSTIC UNILATERAL RIGHT MAMMOGRAM WITH TOMOSYNTHESIS AND
CAD; ULTRASOUND RIGHT BREAST LIMITED
TECHNIQUE: Right digital diagnostic mammography and breast tomosynthesis was
performed. The images were evaluated with computer-aided detection.;
Targeted ultrasound examination of the right breast was performed

[R CC synth-2D]
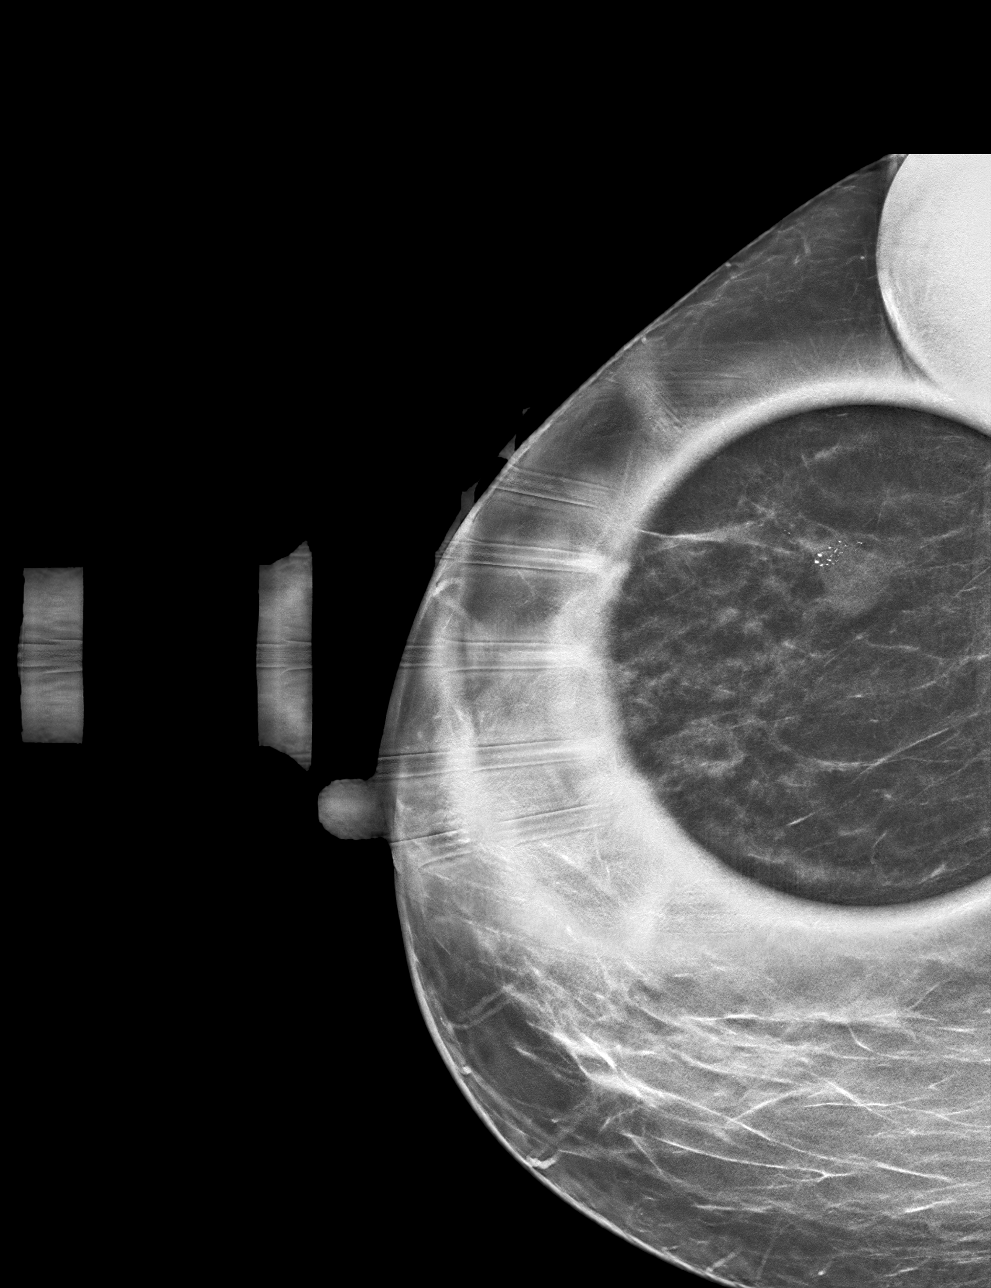

[R MLO synth-2D]
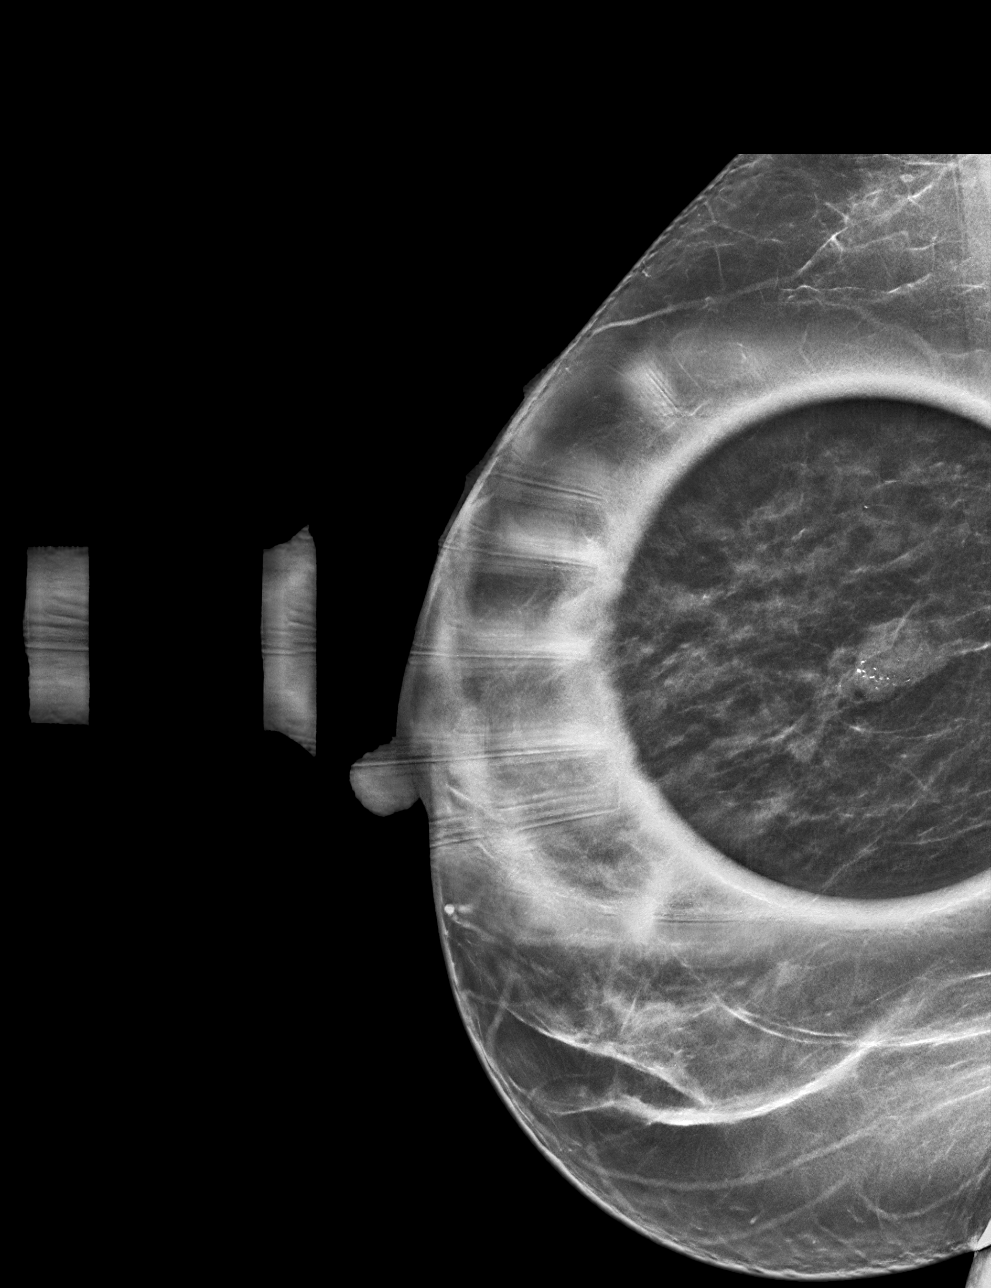

[R CC tomo · tomo slice 29/57.0]
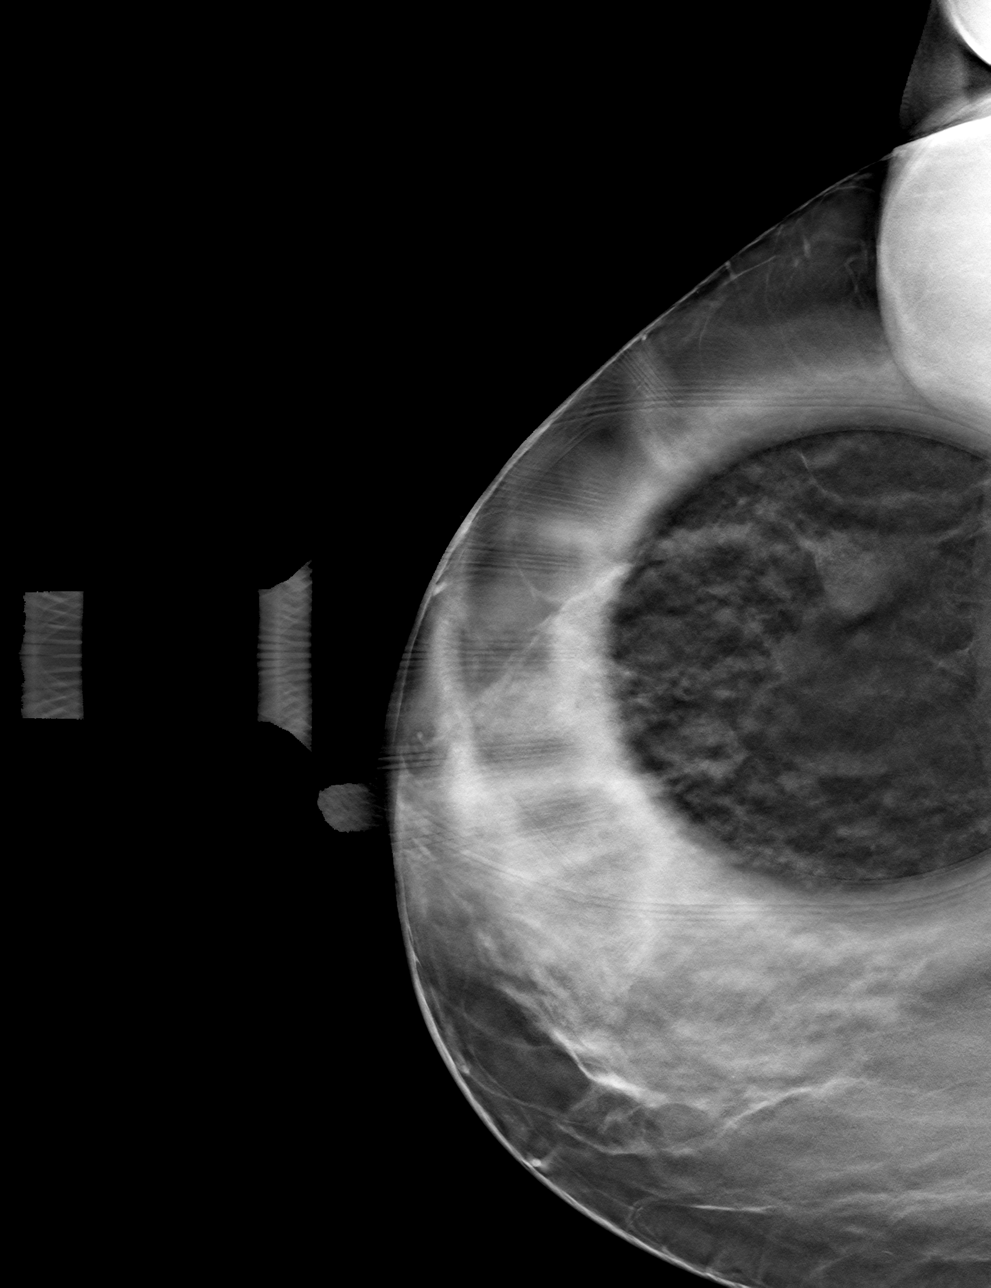

[R MLO tomo · tomo slice 28/55.0]
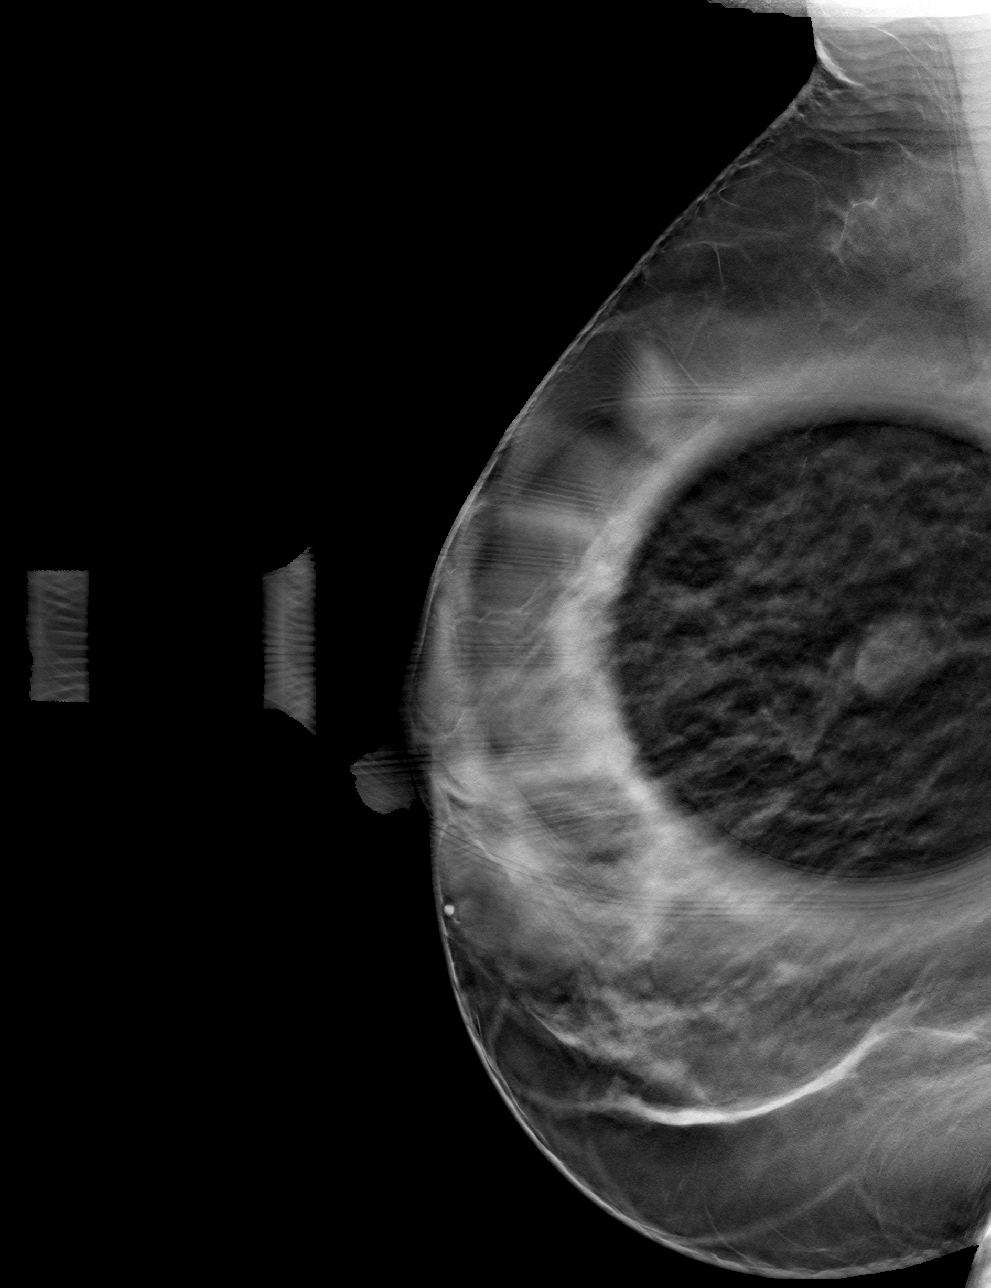

[4 of 12 positions shown; findings below may reference images not displayed]

ACR Breast Density Category c: The breast tissue is heterogeneously
dense, which may obscure small masses.
FINDINGS: Additional imaging of the right breast was performed. There is
persistence of a 1.6 cm mass in the lateral aspect of the breast
associated with coarse heterogeneous calcifications.

Targeted ultrasound is performed, showing an irregular hypoechoic
mass in the right breast at 9 o'clock 5 cm from the nipple measuring
2.1 x 0.9 x 2.1 cm. Sonographic evaluation the right axilla does not
show any enlarged adenopathy.
IMPRESSION: Suspicious mass in the 9 o'clock region of the right breast.

RECOMMENDATION:
Ultrasound-guided core biopsy of the mass in the 9 o'clock region of
the right breast is recommended. The biopsy will be scheduled at the
patient's convenience.

I have discussed the findings and recommendations with the patient.
If applicable, a reminder letter will be sent to the patient
regarding the next appointment.

BI-RADS CATEGORY  5: Highly suggestive of malignancy.

## 2021-12-25 ENCOUNTER — Ambulatory Visit
Admission: RE | Admit: 2021-12-25 | Discharge: 2021-12-25 | Disposition: A | Discharge status: Home / Self Care | Payer: BC Managed Care – PPO | Source: Ambulatory Visit | Attending: Obstetrics | Admitting: Obstetrics

## 2021-12-25 ENCOUNTER — Other Ambulatory Visit: Payer: Self-pay

## 2021-12-25 DIAGNOSIS — R928 Other abnormal and inconclusive findings on diagnostic imaging of breast: Secondary | ICD-10-CM

## 2021-12-25 DIAGNOSIS — N6310 Unspecified lump in the right breast, unspecified quadrant: Secondary | ICD-10-CM

## 2021-12-25 DIAGNOSIS — C50811 Malignant neoplasm of overlapping sites of right female breast: Secondary | ICD-10-CM | POA: Diagnosis not present

## 2021-12-25 DIAGNOSIS — N6311 Unspecified lump in the right breast, upper outer quadrant: Secondary | ICD-10-CM | POA: Diagnosis not present

## 2021-12-25 HISTORY — PX: BREAST BIOPSY: SHX20

## 2021-12-25 IMAGING — US US  BREAST BX W/ LOC DEV 1ST LESION IMG BX SPEC US GUIDE*R*
1 series · 12 of 12 positions shown · non-contrast
Comparison: Previous exam(s).
COMPARISON: Previous exam(s).

Addendum:
CLINICAL DATA: 45-year-old female presenting for ultrasound-guided
biopsy of a right breast mass.

EXAM:
ULTRASOUND GUIDED RIGHT BREAST CORE NEEDLE BIOPSY

[Series 1: us breast bx w/ loc dev 1st lesion img bx spec us  · 0.06mm/px · 12 of 12 slices shown]
[im 1/12]
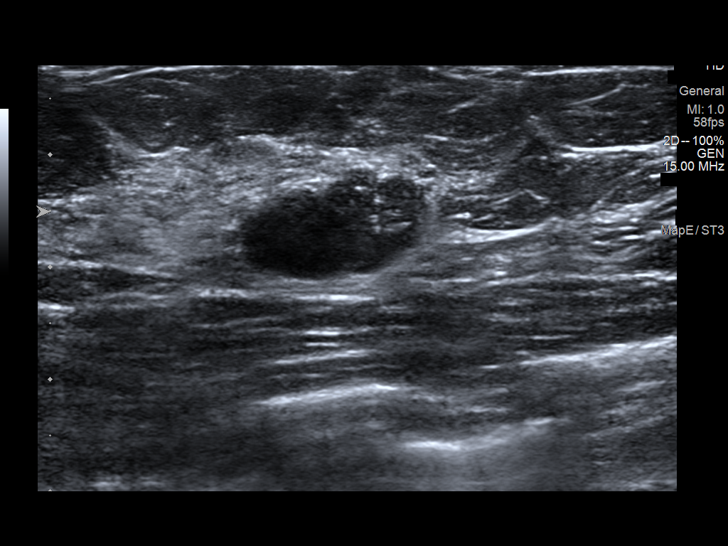
[im 2/12]
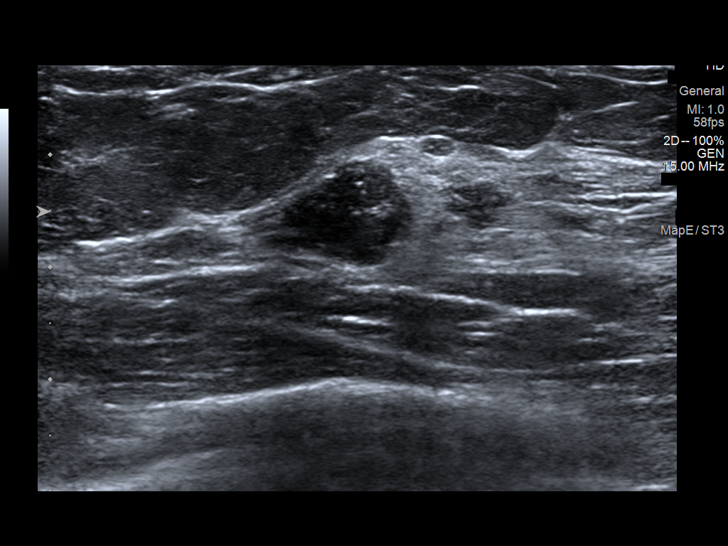
[im 3/12]
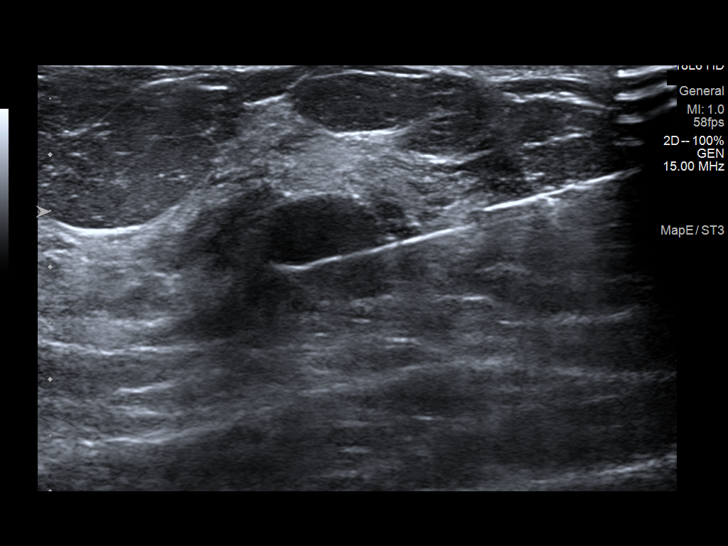
[im 4/12]
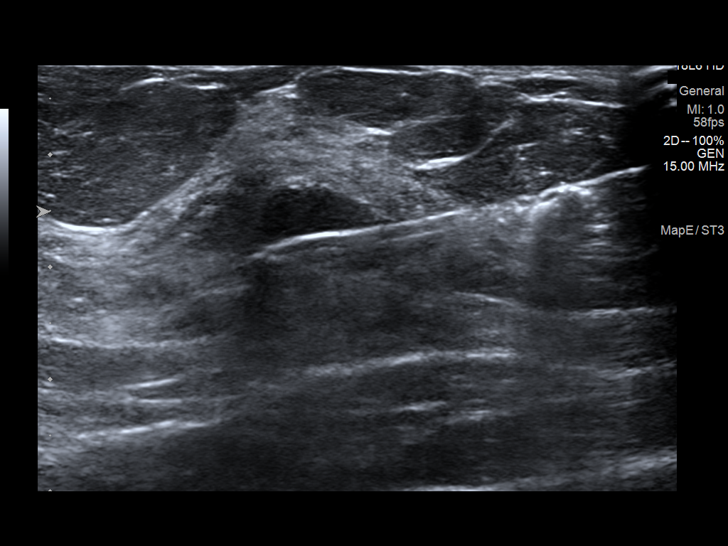
[im 5/12]
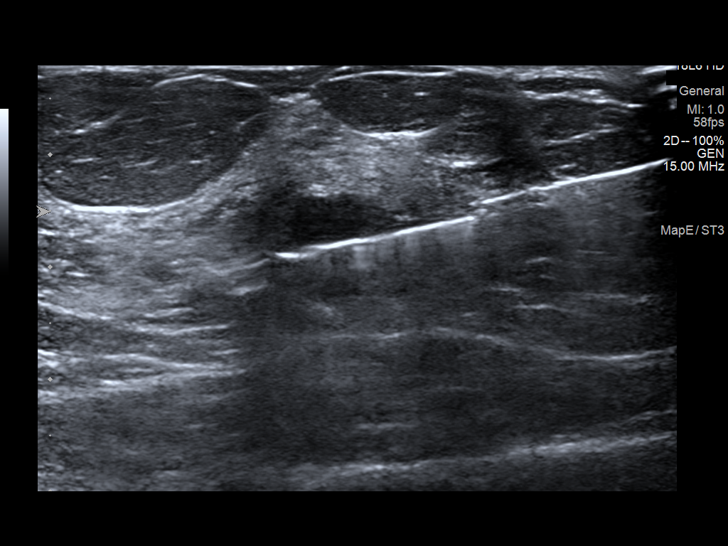
[im 6/12]
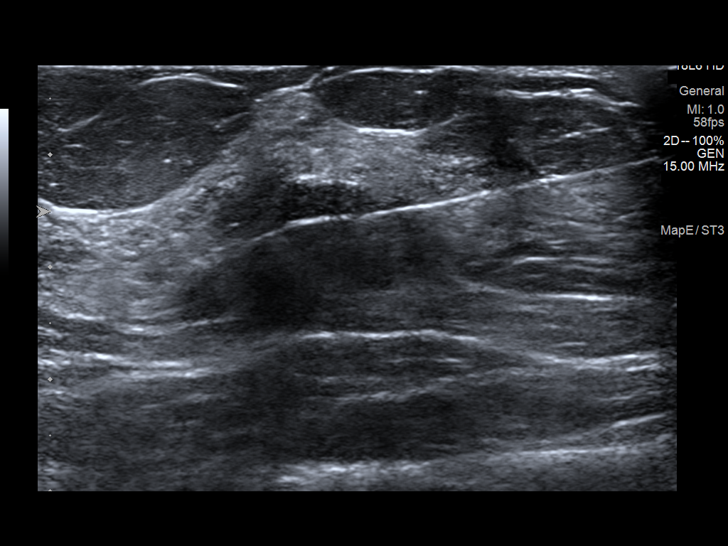
[im 7/12]
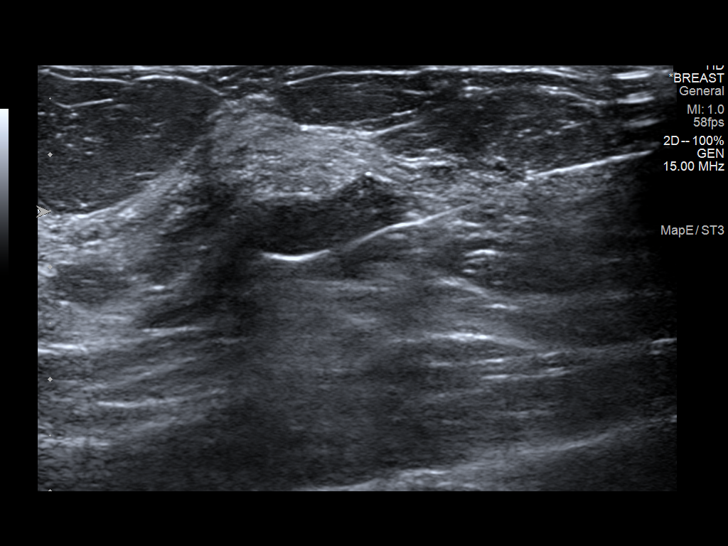
[im 8/12]
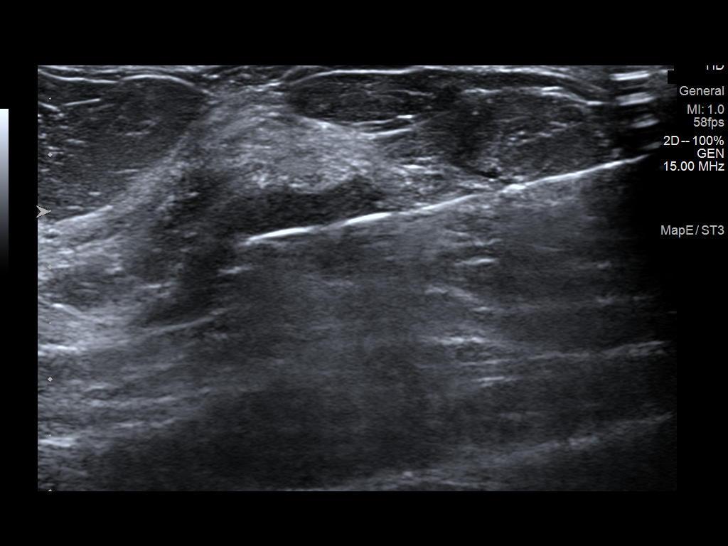
[im 9/12]
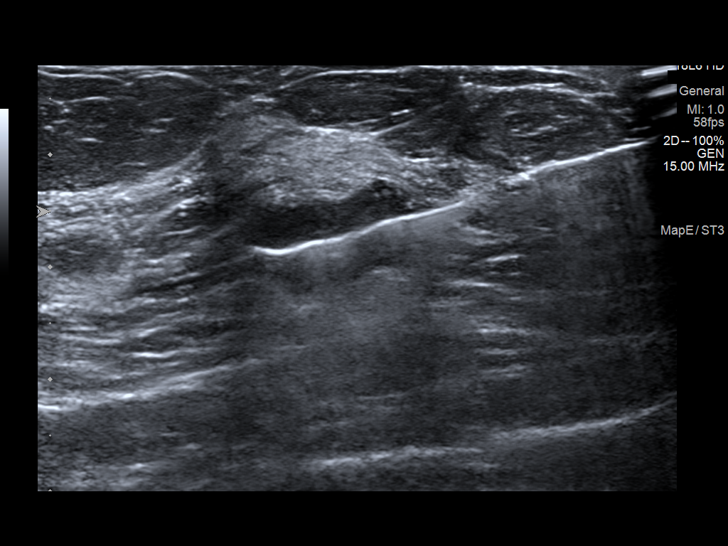
[im 10/12]
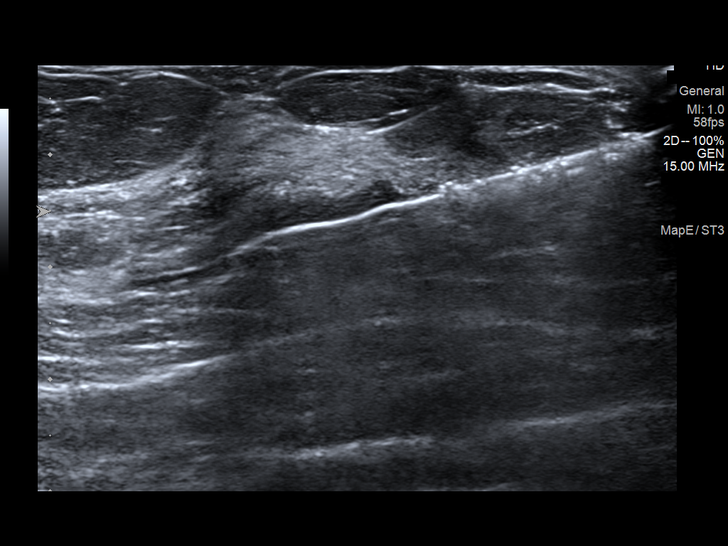
[im 11/12]
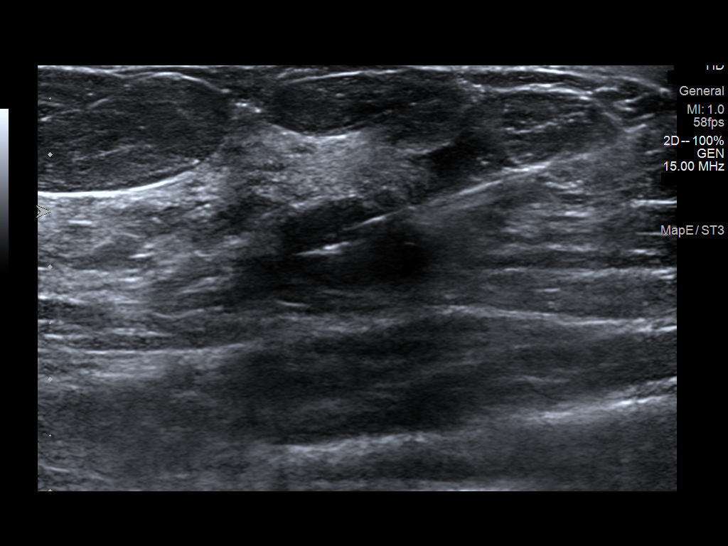
[im 12/12]
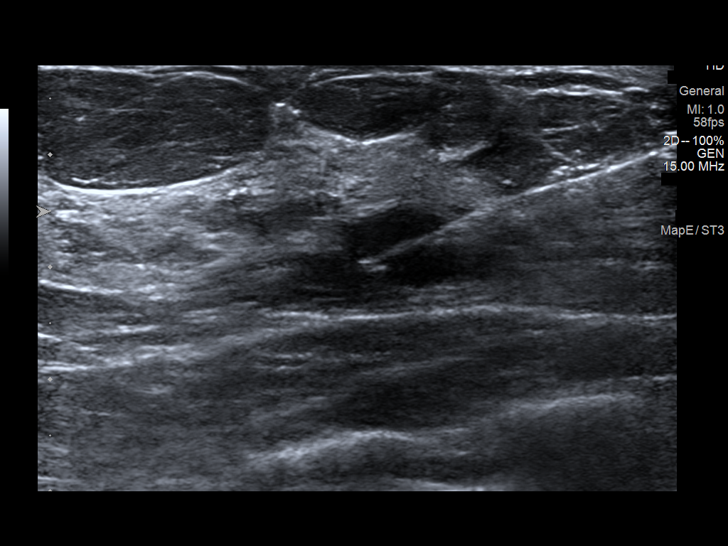

[12 of 12 positions shown; findings below may reference images not displayed]



Lesion quadrant: Upper outer quadrant

Using sterile technique and 1% Lidocaine as local anesthetic, under
direct ultrasound visualization, a 14 gauge EBADAT device was
used to perform biopsy of a mass in the right breast at 9 o'clock, 5
cm from the nipple using a lateral approach. At the conclusion of
the procedure a ribbon shaped tissue marker clip was deployed into
the biopsy cavity. Follow up 2 view mammogram was performed and
dictated separately.
IMPRESSION: Ultrasound guided biopsy of a right breast mass at 9 o'clock. No
apparent complications.

ADDENDUM:
Pathology revealed GRADE III INVASIVE DUCTAL CARCINOMA, DUCTAL
CARCINOMA IN SITU, CALCIFICATIONS of the RIGHT breast, 9:00 o'clock,
5 cmfn (ribbon clip). This was found to be concordant by Dr.
EBADAT.

Pathology results were discussed with the patient by telephone. The
patient reported doing well after the biopsy with tenderness at the
site. Post biopsy instructions and care were reviewed and questions
were answered. The patient was encouraged to call The [REDACTED] for any additional concerns. My direct phone
number was provided.

The patient was referred to [REDACTED]
[REDACTED] at [REDACTED] on
[DATE].

Pathology results reported by EBADAT, RN on [DATE].



Lesion quadrant: Upper outer quadrant

Using sterile technique and 1% Lidocaine as local anesthetic, under
direct ultrasound visualization, a 14 gauge EBADAT device was
used to perform biopsy of a mass in the right breast at 9 o'clock, 5
cm from the nipple using a lateral approach. At the conclusion of
the procedure a ribbon shaped tissue marker clip was deployed into
the biopsy cavity. Follow up 2 view mammogram was performed and
dictated separately.
IMPRESSION: Ultrasound guided biopsy of a right breast mass at 9 o'clock. No
apparent complications.

## 2021-12-25 IMAGING — MG MM BREAST LOCALIZATION CLIP
4 series · 4 of 12 positions shown · non-contrast
Comparison: Previous exam(s).

CLINICAL DATA: Post biopsy mammogram of the right breast for clip
placement.

EXAM:
3D DIAGNOSTIC RIGHT MAMMOGRAM POST ULTRASOUND BIOPSY

[R CC synth-2D]
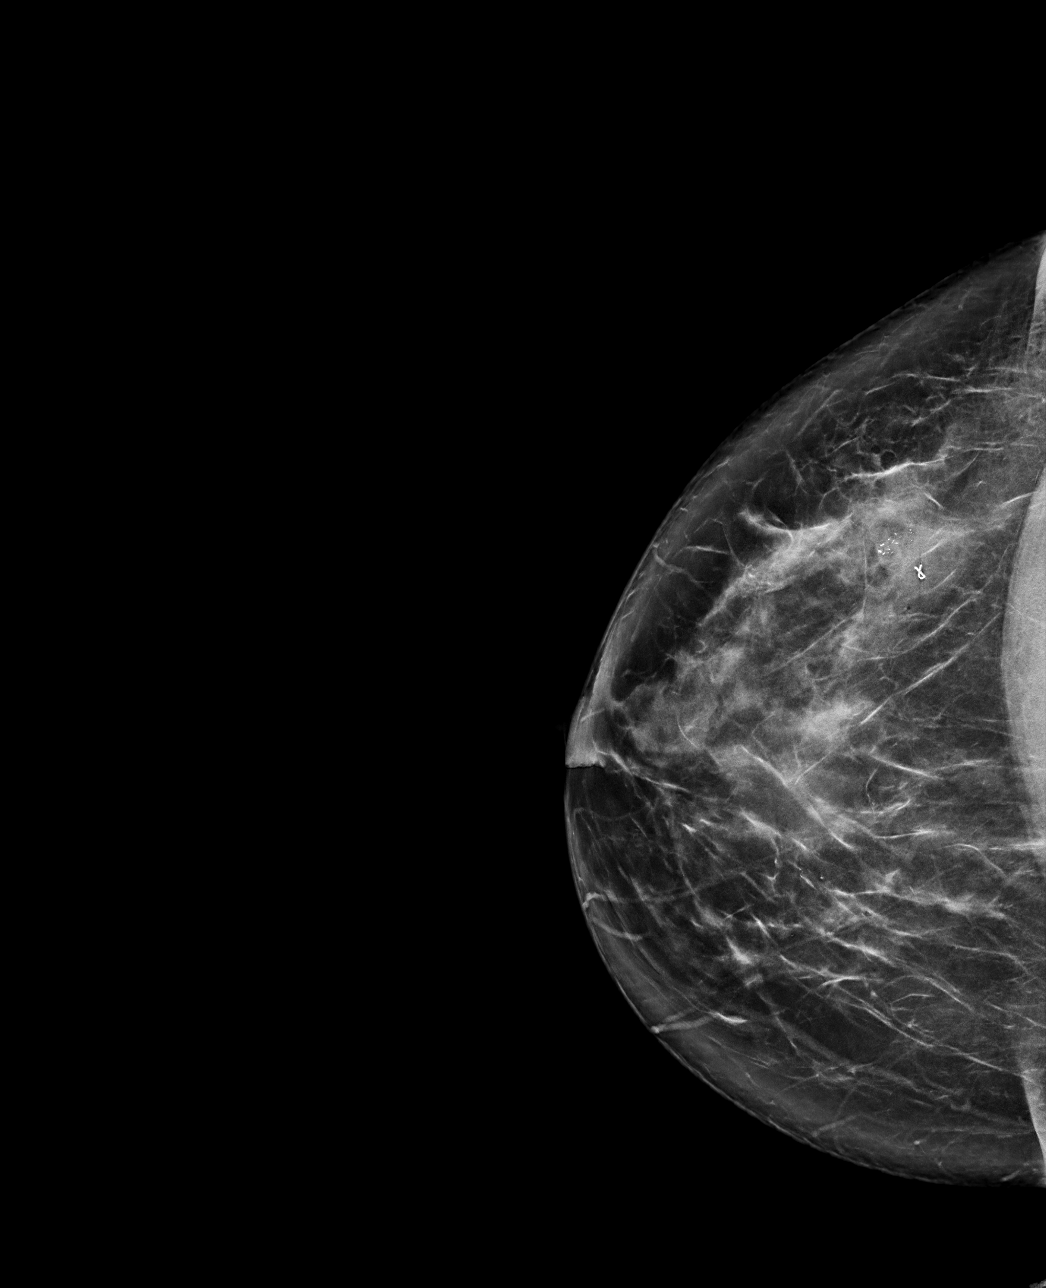

[R ML synth-2D]
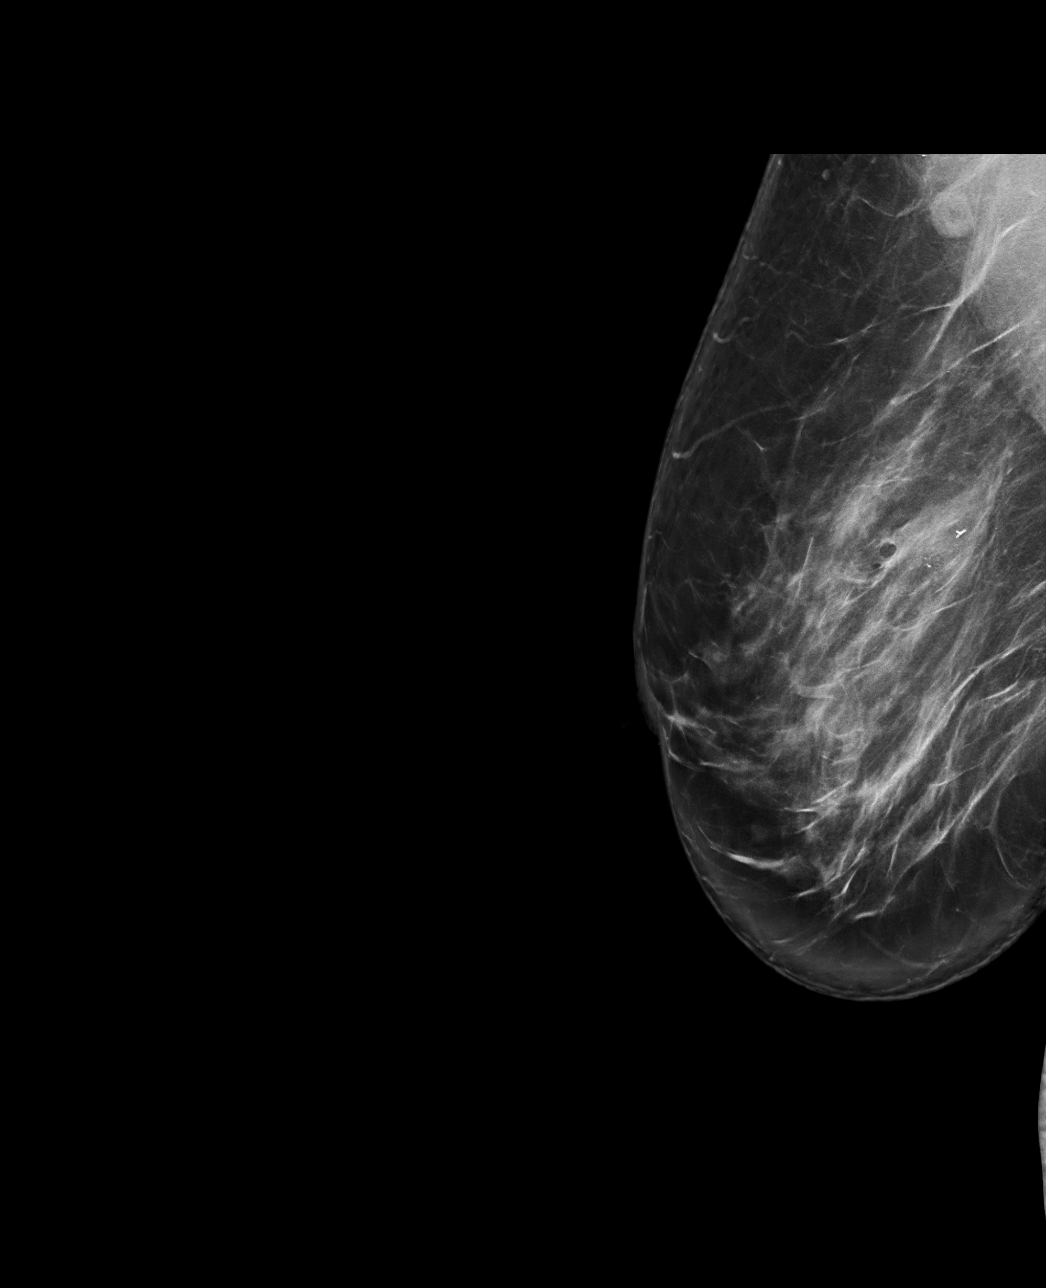

[R CC tomo · tomo slice 41/82.0]
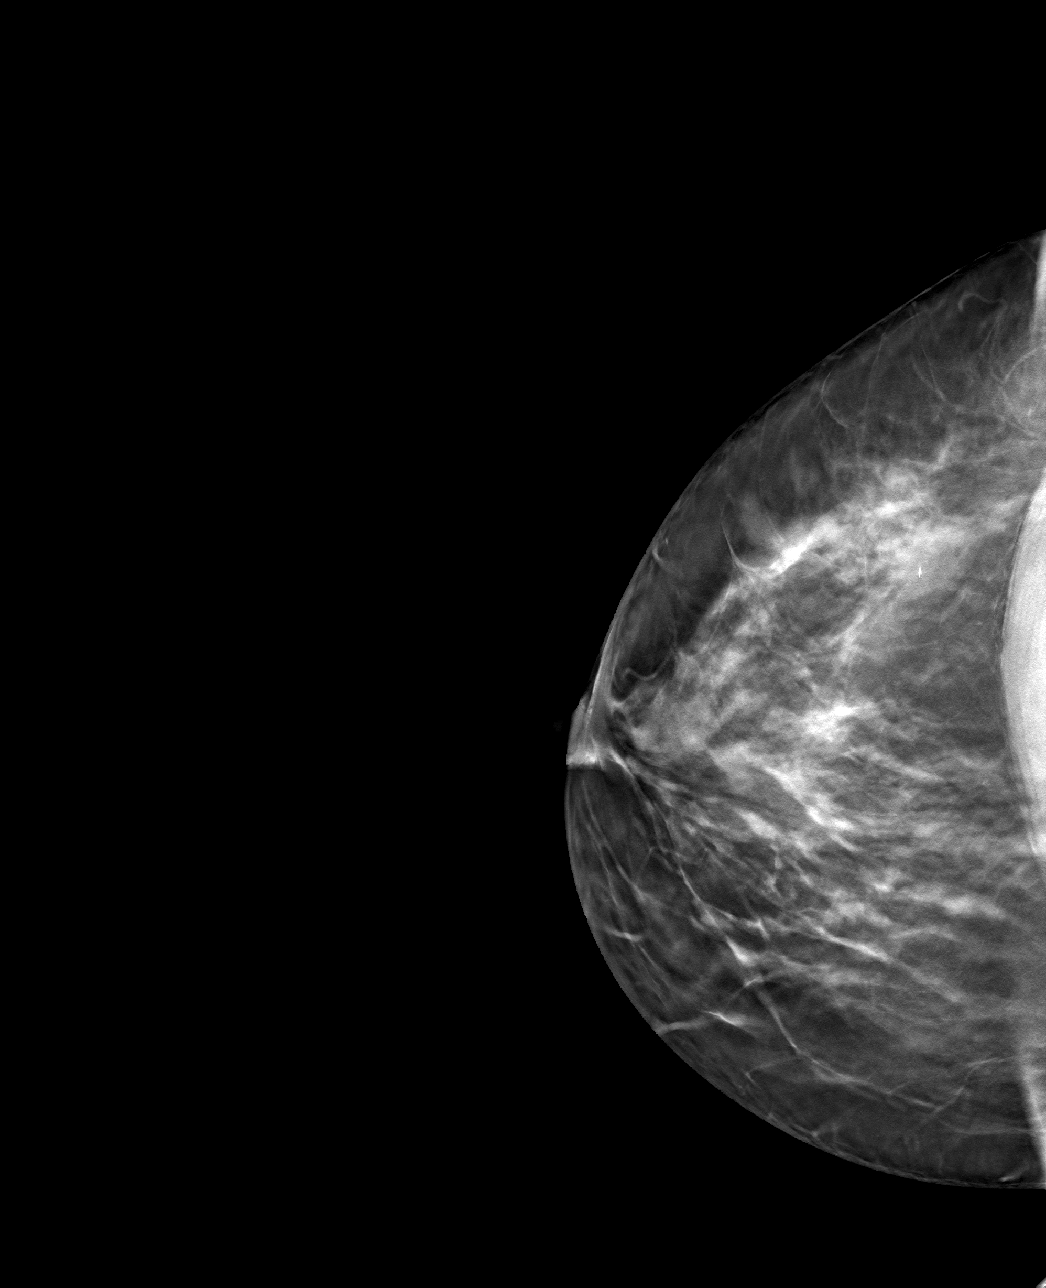

[R ML tomo · tomo slice 48/95.0]
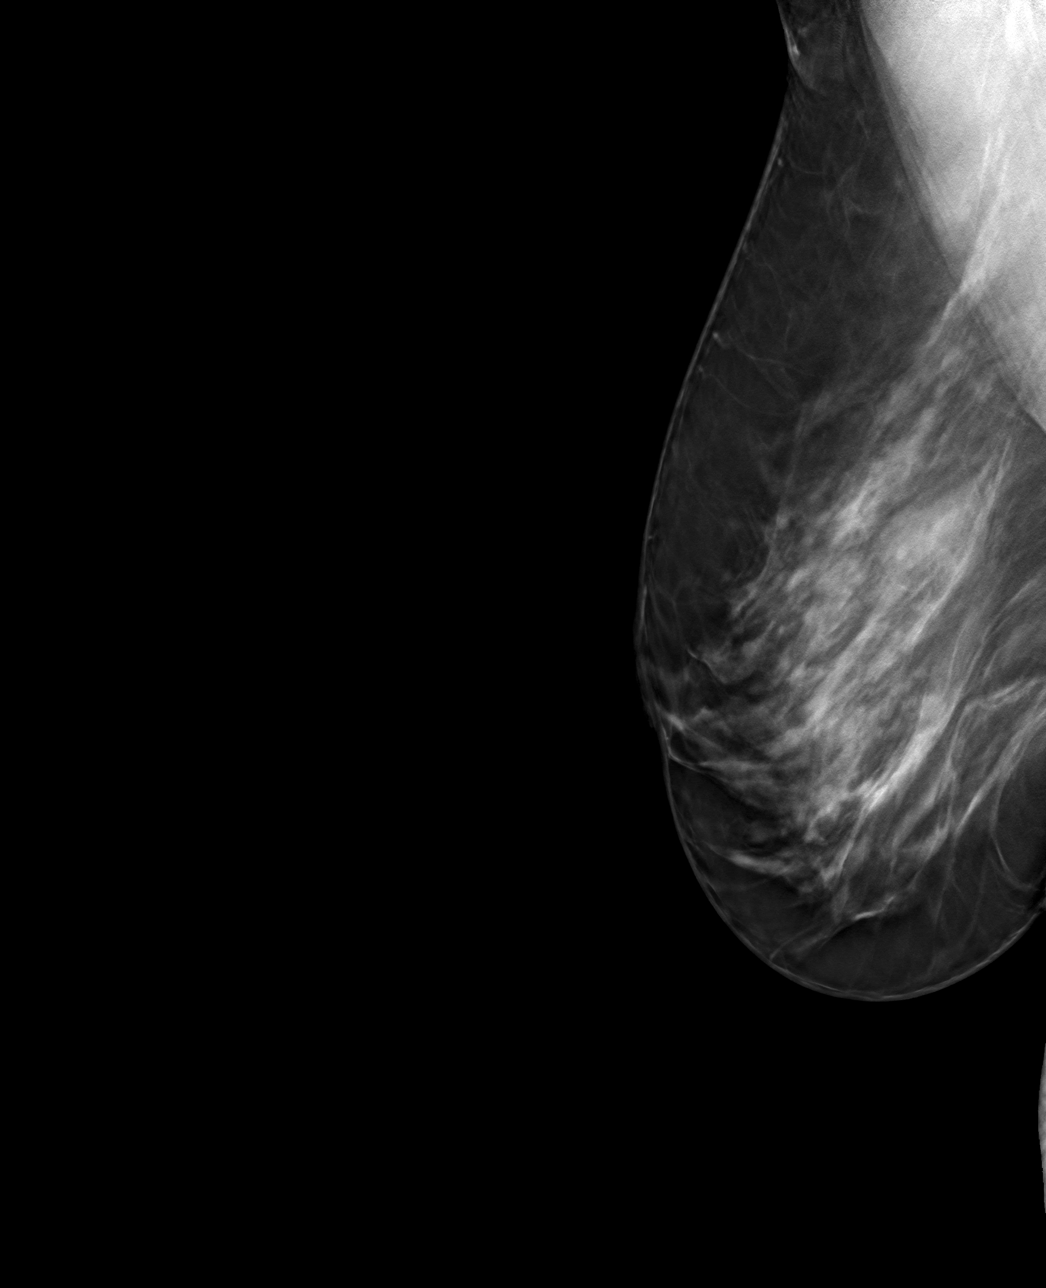

[4 of 12 positions shown; findings below may reference images not displayed]

FINDINGS: 3D Mammographic images were obtained following ultrasound guided
biopsy of a mass in the right breast at 9 o'clock. The biopsy
marking clip is in expected position at the site of biopsy.
IMPRESSION: Appropriate positioning of the ribbon shaped biopsy marking clip at
the site of biopsy in the lateral right breast.

Final Assessment: Post Procedure Mammograms for Marker Placement

## 2021-12-27 ENCOUNTER — Telehealth: Payer: Self-pay | Admitting: Hematology and Oncology

## 2021-12-27 NOTE — Telephone Encounter (Signed)
Spoke to patient to confirm morning clinic appointment for 1/11, packet sent via e-mail °

## 2021-12-31 ENCOUNTER — Encounter: Payer: Self-pay | Admitting: *Deleted

## 2021-12-31 DIAGNOSIS — Z170 Estrogen receptor positive status [ER+]: Secondary | ICD-10-CM | POA: Insufficient documentation

## 2021-12-31 DIAGNOSIS — C50411 Malignant neoplasm of upper-outer quadrant of right female breast: Secondary | ICD-10-CM

## 2022-01-01 NOTE — Progress Notes (Signed)
Radiation Oncology         ([phone]) [phone] ________________________________  Initial Outpatient Consultation  Name: Tonya Blair MRN: [id number]  Date: 01/02/2022  DOB: 1976-10-07  CC:Pcp, No  Rolm Bookbinder, MD   REFERRING PHYSICIAN: Rolm Bookbinder, MD  DIAGNOSIS:    ICD-10-CM   1. 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 female who presented with a right breast abnormality on the following imaging: bilateral screening mammogram on the date of 09/14/21.  No symptoms, if any, were reported at that time. Diagnostic right breast mammogram and right breast ultrasound on the date of 12/11/21 further revealed a suspicious mass in the 9 o'clock region of the right breast.     Biopsy of the 9 o'clock right breast mass, 5cm fn, on the date of 12/25/21 showed grade 3 invasive ductal carcinoma, and ductal carcinoma in-situ with calcifications, measuring 1.3 cm in the greatest linear extent.  ER status: 70% positive with moderate staining intensity; PR status 20% positive with moderate staining intensity, Her2 status positive; proliferation marker Ki67 at 30%; Grade 3.  She is a nonsmoker. She works in Radio broadcast assistant. She is otherwise in her USOH.  She has two children, one still in highschool.   PREVIOUS RADIATION THERAPY: No  PAST MEDICAL  HISTORY:  has a past medical history of Family history of prostate cancer (01/02/2022).    PAST SURGICAL HISTORY: Past Surgical History:  Procedure Laterality Date   BREAST BIOPSY Right 12/25/2021    FAMILY HISTORY: family history includes Prostate cancer (age of onset: 39) in her father.  SOCIAL HISTORY:  reports that she has never smoked. She does not have any smokeless tobacco history on file.  ALLERGIES: Codeine  MEDICATIONS:  Current Outpatient Medications  Medication Sig Dispense Refill   dexamethasone (DECADRON) 4 MG tablet Take 2 tablets (8 mg total) by mouth 2 (two) times daily. Start the day before Taxotere. Then take daily x 3 days after chemotherapy. 30 tablet 1   levonorgestrel (MIRENA, 52 MG,) 20 MCG/DAY IUD Mirena 20 mcg/24 hours (8 yrs) 52 mg intrauterine device     lidocaine-prilocaine (EMLA) cream Apply to affected area once 30 g 3   LORazepam (ATIVAN) 0.5 MG tablet Take 1 tablet (0.5 mg total) by mouth every 6 (six) hours as needed (Nausea or vomiting). 30 tablet 0   ondansetron (ZOFRAN) 8 MG tablet Take 1 tablet (8 mg total) by mouth 2 (two) times daily as needed (Nausea or vomiting). Start on the third day after chemotherapy. 30 tablet 1   prochlorperazine (COMPAZINE) 10 MG tablet Take 1 tablet (10 mg total) by mouth every 6 (six) hours as needed (Nausea or vomiting). 30 tablet 1   No current facility-administered medications for this encounter.    REVIEW OF SYSTEMS: As above in HPI.   PHYSICAL EXAM:  vitals were not taken for  this visit.   General: Alert and oriented, in no acute distress HEENT: Head is normocephalic. Extraocular movements are intact. Oropharynx is clear. Neck: Neck is supple, no palpable cervical or supraclavicular lymphadenopathy. Heart: Regular in rate and rhythm with no murmurs, rubs, or gallops. Chest: Clear to auscultation bilaterally, with no rhonchi, wheezes, or rales. Abdomen: Soft, nontender, nondistended, with no rigidity or  guarding. Skin: No concerning lesions. Musculoskeletal: symmetric strength and muscle tone throughout. Neurologic: Cranial nerves II through XII are grossly intact. No obvious focalities. Speech is fluent. Coordination is intact. Psychiatric: Judgment and insight are intact. Affect is appropriate. Breasts: subtle thickening at 10:00 right breast . No other palpable masses appreciated in the breasts or axillae bilaterally .    ECOG = 0  0 - Asymptomatic (Fully active, able to carry on all predisease activities without restriction)  1 - Symptomatic but completely ambulatory (Restricted in physically strenuous activity but ambulatory and able to carry out work of a light or sedentary nature. For example, light housework, office work)  2 - Symptomatic, <50% in bed during the day (Ambulatory and capable of all self care but unable to carry out any work activities. Up and about more than 50% of waking hours)  3 - Symptomatic, >50% in bed, but not bedbound (Capable of only limited self-care, confined to bed or chair 50% or more of waking hours)  4 - Bedbound (Completely disabled. Cannot carry on any self-care. Totally confined to bed or chair)  5 - Death   Eustace Pen MM, Creech RH, Tormey DC, et al. [phone]). "Toxicity and response criteria of the [facility]". [place] Oncol. 5 (6): 649-55   LABORATORY DATA:  Lab Results  Component Value Date   WBC 7.9 01/02/2022   HGB 12.5 01/02/2022   HCT 38.0 01/02/2022   MCV 80.0 01/02/2022   PLT 304 01/02/2022   CMP     Component Value Date/Time   NA 136 01/02/2022 0833   K 3.9 01/02/2022 0833   CL 104 01/02/2022 0833   CO2 25 01/02/2022 0833   GLUCOSE 141 (H) 01/02/2022 0833   BUN 11 01/02/2022 0833   CREATININE 1.07 (H) 01/02/2022 0833   CALCIUM 9.7 01/02/2022 0833   PROT 7.7 01/02/2022 0833   ALBUMIN 4.4 01/02/2022 0833   AST 15 01/02/2022 0833   ALT 15 01/02/2022 0833   ALKPHOS 72 01/02/2022 0833   BILITOT 0.5  01/02/2022 0833   GFRNONAA >60 01/02/2022 0833         RADIOGRAPHY: US BREAST LTD UNI RIGHT INC AXILLA  Result Date: 12/11/2021 CLINICAL DATA:  Patient was recalled from screening mammogram for a right breast mass. EXAM: DIGITAL DIAGNOSTIC UNILATERAL RIGHT MAMMOGRAM WITH TOMOSYNTHESIS AND CAD; ULTRASOUND RIGHT BREAST LIMITED TECHNIQUE: Right digital diagnostic mammography and breast tomosynthesis was performed. The images were evaluated with computer-aided detection.; Targeted ultrasound examination of the right breast was performed COMPARISON:  Previous exam(s). ACR Breast Density Category c: The breast tissue is heterogeneously dense, which may obscure small masses. FINDINGS: Additional imaging of the right breast was performed. There is persistence of a 1.6 cm mass in the lateral aspect of the breast associated with coarse heterogeneous calcifications. Targeted ultrasound is performed, showing an irregular hypoechoic mass in the right breast at 9 o'clock 5 cm from the nipple measuring 2.1 x 0.9 x 2.1 cm. Sonographic evaluation the right axilla does not show any enlarged adenopathy. IMPRESSION: Suspicious mass in the 9 o'clock region of the right breast. RECOMMENDATION:  Ultrasound-guided core biopsy of the mass in the 9 o'clock region of the right breast is recommended. The biopsy will be scheduled at the patient's convenience. I have discussed the findings and recommendations with the patient. If applicable, a reminder letter will be sent to the patient regarding the next appointment. BI-RADS CATEGORY  5: Highly suggestive of malignancy. Electronically Signed   By: Lillia Mountain M.D.   On: 12/11/2021 11:22  MM DIAG BREAST TOMO UNI RIGHT  Result Date: 12/11/2021 CLINICAL DATA:  Patient was recalled from screening mammogram for a right breast mass. EXAM: DIGITAL DIAGNOSTIC UNILATERAL RIGHT MAMMOGRAM WITH TOMOSYNTHESIS AND CAD; ULTRASOUND RIGHT BREAST LIMITED TECHNIQUE: Right digital diagnostic  mammography and breast tomosynthesis was performed. The images were evaluated with computer-aided detection.; Targeted ultrasound examination of the right breast was performed COMPARISON:  Previous exam(s). ACR Breast Density Category c: The breast tissue is heterogeneously dense, which may obscure small masses. FINDINGS: Additional imaging of the right breast was performed. There is persistence of a 1.6 cm mass in the lateral aspect of the breast associated with coarse heterogeneous calcifications. Targeted ultrasound is performed, showing an irregular hypoechoic mass in the right breast at 9 o'clock 5 cm from the nipple measuring 2.1 x 0.9 x 2.1 cm. Sonographic evaluation the right axilla does not show any enlarged adenopathy. IMPRESSION: Suspicious mass in the 9 o'clock region of the right breast. RECOMMENDATION: Ultrasound-guided core biopsy of the mass in the 9 o'clock region of the right breast is recommended. The biopsy will be scheduled at the patient's convenience. I have discussed the findings and recommendations with the patient. If applicable, a reminder letter will be sent to the patient regarding the next appointment. BI-RADS CATEGORY  5: Highly suggestive of malignancy. Electronically Signed   By: Lillia Mountain M.D.   On: 12/11/2021 11:22  MM CLIP PLACEMENT RIGHT  Result Date: 12/25/2021 CLINICAL DATA:  Post biopsy mammogram of the right breast for clip placement. EXAM: 3D DIAGNOSTIC RIGHT MAMMOGRAM POST ULTRASOUND BIOPSY COMPARISON:  Previous exam(s). FINDINGS: 3D Mammographic images were obtained following ultrasound guided biopsy of a mass in the right breast at 9 o'clock. The biopsy marking clip is in expected position at the site of biopsy. IMPRESSION: Appropriate positioning of the ribbon shaped biopsy marking clip at the site of biopsy in the lateral right breast. Final Assessment: Post Procedure Mammograms for Marker Placement Electronically Signed   By: Ammie Ferrier M.D.   On:  12/25/2021 12:22  [place] RT BREAST BX W LOC DEV 1ST LESION IMG BX SPEC US GUIDE  Addendum Date: 12/27/2021   ADDENDUM REPORT: 12/27/2021 09:22 ADDENDUM: Pathology revealed GRADE III INVASIVE DUCTAL CARCINOMA, DUCTAL CARCINOMA IN SITU, CALCIFICATIONS of the RIGHT breast, 9:00 o'clock, 5 cmfn (ribbon clip). This was found to be concordant by Dr. Ammie Ferrier. Pathology results were discussed with the patient by telephone. The patient reported doing well after the biopsy with tenderness at the site. Post biopsy instructions and care were reviewed and questions were answered. The patient was encouraged to call [facility] for any additional concerns. My direct phone number was provided. The patient was referred to [facility] on January 02, 2022. Pathology results reported by Terie Purser, RN on 12/27/2021. Electronically Signed   By: Ammie Ferrier M.D.   On: 12/27/2021 09:22   Result Date: 12/27/2021 CLINICAL DATA:  46 year old female presenting for ultrasound-guided biopsy of a right breast mass. EXAM:  ULTRASOUND GUIDED RIGHT BREAST CORE NEEDLE BIOPSY COMPARISON:  Previous exam(s). PROCEDURE: I met with the patient and we discussed the procedure of ultrasound-guided biopsy, including benefits and alternatives. We discussed the high likelihood of a successful procedure. We discussed the risks of the procedure, including infection, bleeding, tissue injury, clip migration, and inadequate sampling. Informed written consent was given. The usual time-out protocol was performed immediately prior to the procedure. Lesion quadrant: Upper outer quadrant Using sterile technique and 1% Lidocaine as local anesthetic, under direct ultrasound visualization, a 14 gauge spring-loaded device was used to perform biopsy of a mass in the right breast at 9 o'clock, 5 cm from the nipple using a lateral approach. At the conclusion of  the procedure a ribbon shaped tissue marker clip was deployed into the biopsy cavity. Follow up 2 view mammogram was performed and dictated separately. IMPRESSION: Ultrasound guided biopsy of a right breast mass at 9 o'clock. No apparent complications. Electronically Signed: By: Ammie Ferrier M.D. On: 12/25/2021 12:15     IMPRESSION/PLAN: Right breast cancer, triple positive   It was a pleasure meeting the patient today.Our consensus at tumor board discussion is:  Genetics referral  MRI of breasts  Neoadjuvant systemic therapy  Surgery (breast conserving)  Post op radiation to breast  We discussed the risks, benefits, and side effects of radiotherapy. I recommend radiotherapy to the right breast to reduce her risk of locoregional recurrence by 2/3.  We discussed that radiation would take approximately 4 weeks to complete and that I would give the patient a few weeks to heal following surgery before starting treatment planning.   We spoke about acute effects including skin irritation and fatigue as well as much less common late effects including internal organ injury or irritation. We spoke about the latest technology that is used to minimize the risk of late effects for patients undergoing radiotherapy to the breast or chest wall. No guarantees of treatment were given. The patient is enthusiastic about proceeding with treatment. I look forward to participating in the patient's care.  I will await her referral back to me for postoperative follow-up and eventual CT simulation/treatment planning.  On date of service, in total, I spent 45 minutes on this encounter. Patient was seen in person.   __________________________________________   Eppie Gibson, MD  This document serves as a record of services personally performed by Eppie Gibson, MD. It was created on her behalf by Roney Mans, a trained medical scribe. The creation of this record is based on the scribe's personal observations and  the provider's statements to them. This document has been checked and approved by the attending provider.

## 2022-01-02 ENCOUNTER — Ambulatory Visit: Payer: 59 | Attending: General Surgery | Admitting: Physical Therapy

## 2022-01-02 ENCOUNTER — Inpatient Hospital Stay: Payer: 59 | Admitting: Licensed Clinical Social Worker

## 2022-01-02 ENCOUNTER — Ambulatory Visit
Admission: RE | Admit: 2022-01-02 | Discharge: 2022-01-02 | Disposition: A | Discharge status: Home / Self Care | Payer: 59 | Source: Ambulatory Visit | Attending: Radiation Oncology | Admitting: Radiation Oncology

## 2022-01-02 ENCOUNTER — Other Ambulatory Visit: Payer: Self-pay | Admitting: General Surgery

## 2022-01-02 ENCOUNTER — Encounter: Payer: Self-pay | Admitting: *Deleted

## 2022-01-02 ENCOUNTER — Encounter: Payer: Self-pay | Admitting: Hematology and Oncology

## 2022-01-02 ENCOUNTER — Encounter: Payer: Self-pay | Admitting: Radiation Oncology

## 2022-01-02 ENCOUNTER — Inpatient Hospital Stay: Payer: 59

## 2022-01-02 ENCOUNTER — Other Ambulatory Visit: Payer: Self-pay | Admitting: *Deleted

## 2022-01-02 ENCOUNTER — Telehealth: Payer: Self-pay | Admitting: Hematology and Oncology

## 2022-01-02 ENCOUNTER — Inpatient Hospital Stay (HOSPITAL_BASED_OUTPATIENT_CLINIC_OR_DEPARTMENT_OTHER): Payer: 59 | Admitting: Hematology and Oncology

## 2022-01-02 ENCOUNTER — Other Ambulatory Visit: Payer: Self-pay

## 2022-01-02 ENCOUNTER — Encounter: Payer: Self-pay | Admitting: Physical Therapy

## 2022-01-02 ENCOUNTER — Ambulatory Visit (HOSPITAL_BASED_OUTPATIENT_CLINIC_OR_DEPARTMENT_OTHER): Payer: 59 | Admitting: Genetic Counselor

## 2022-01-02 ENCOUNTER — Encounter: Payer: Self-pay | Admitting: Genetic Counselor

## 2022-01-02 VITALS — BP 117/78 | HR 95 | Temp 97.5°F | Resp 18 | Ht 62.5 in | Wt 197.4 lb

## 2022-01-02 DIAGNOSIS — Z170 Estrogen receptor positive status [ER+]: Secondary | ICD-10-CM

## 2022-01-02 DIAGNOSIS — Z5111 Encounter for antineoplastic chemotherapy: Secondary | ICD-10-CM | POA: Insufficient documentation

## 2022-01-02 DIAGNOSIS — C50411 Malignant neoplasm of upper-outer quadrant of right female breast: Secondary | ICD-10-CM

## 2022-01-02 DIAGNOSIS — Z5189 Encounter for other specified aftercare: Secondary | ICD-10-CM | POA: Insufficient documentation

## 2022-01-02 DIAGNOSIS — Z8042 Family history of malignant neoplasm of prostate: Secondary | ICD-10-CM

## 2022-01-02 DIAGNOSIS — Z5112 Encounter for antineoplastic immunotherapy: Secondary | ICD-10-CM | POA: Insufficient documentation

## 2022-01-02 DIAGNOSIS — R293 Abnormal posture: Secondary | ICD-10-CM | POA: Diagnosis not present

## 2022-01-02 HISTORY — DX: Family history of malignant neoplasm of prostate: Z80.42

## 2022-01-02 LAB — CBC WITH DIFFERENTIAL (CANCER CENTER ONLY)
Abs Immature Granulocytes: 0.01 10*3/uL (ref 0.00–0.07)
Basophils Absolute: 0 10*3/uL (ref 0.0–0.1)
Basophils Relative: 0 %
Eosinophils Absolute: 0.2 10*3/uL (ref 0.0–0.5)
Eosinophils Relative: 3 %
HCT: 38 % (ref 36.0–46.0)
Hemoglobin: 12.5 g/dL (ref 12.0–15.0)
Immature Granulocytes: 0 %
Lymphocytes Relative: 33 %
Lymphs Abs: 2.6 10*3/uL (ref 0.7–4.0)
MCH: 26.3 pg (ref 26.0–34.0)
MCHC: 32.9 g/dL (ref 30.0–36.0)
MCV: 80 fL (ref 80.0–100.0)
Monocytes Absolute: 0.6 10*3/uL (ref 0.1–1.0)
Monocytes Relative: 8 %
Neutro Abs: 4.4 10*3/uL (ref 1.7–7.7)
Neutrophils Relative %: 56 %
Platelet Count: 304 10*3/uL (ref 150–400)
RBC: 4.75 MIL/uL (ref 3.87–5.11)
RDW: 14.9 % (ref 11.5–15.5)
WBC Count: 7.9 10*3/uL (ref 4.0–10.5)
nRBC: 0 % (ref 0.0–0.2)

## 2022-01-02 LAB — CMP (CANCER CENTER ONLY)
ALT: 15 U/L (ref 0–44)
AST: 15 U/L (ref 15–41)
Albumin: 4.4 g/dL (ref 3.5–5.0)
Alkaline Phosphatase: 72 U/L (ref 38–126)
Anion gap: 7 (ref 5–15)
BUN: 11 mg/dL (ref 6–20)
CO2: 25 mmol/L (ref 22–32)
Calcium: 9.7 mg/dL (ref 8.9–10.3)
Chloride: 104 mmol/L (ref 98–111)
Creatinine: 1.07 mg/dL — ABNORMAL HIGH (ref 0.44–1.00)
GFR, Estimated: >60 mL/min (ref >60)
Glucose, Bld: 141 mg/dL — ABNORMAL HIGH (ref 70–99)
Potassium: 3.9 mmol/L (ref 3.5–5.1)
Sodium: 136 mmol/L (ref 135–145)
Total Bilirubin: 0.5 mg/dL (ref 0.3–1.2)
Total Protein: 7.7 g/dL (ref 6.5–8.1)

## 2022-01-02 LAB — GENETIC SCREENING ORDER

## 2022-01-02 MED ORDER — LORAZEPAM 0.5 MG PO TABS: 0.5000 mg | ORAL_TABLET | Freq: Four times a day (QID) | ORAL | 0 refills | Status: DC | PRN

## 2022-01-02 MED ORDER — PROCHLORPERAZINE MALEATE 10 MG PO TABS: 10.0000 mg | ORAL_TABLET | Freq: Four times a day (QID) | ORAL | 1 refills | Status: DC | PRN

## 2022-01-02 MED ORDER — DEXAMETHASONE 4 MG PO TABS: 8.0000 mg | ORAL_TABLET | Freq: Two times a day (BID) | ORAL | 1 refills | Status: DC

## 2022-01-02 MED ORDER — ONDANSETRON HCL 8 MG PO TABS: 8.0000 mg | ORAL_TABLET | Freq: Two times a day (BID) | ORAL | 1 refills | Status: DC | PRN

## 2022-01-02 MED ORDER — LIDOCAINE-PRILOCAINE 2.5-2.5 % EX CREA: TOPICAL_CREAM | CUTANEOUS | 3 refills | Status: DC

## 2022-01-02 NOTE — Progress Notes (Signed)
[facility] Work  Initial Assessment   Tonya Blair is a 46 y.o. year old female accompanied by friend, Candace. Clinical Social Work was referred by [facility] for assessment of psychosocial needs.   SDOH (Social Determinants of Health) assessments performed: Yes SDOH Interventions    Flowsheet Row Most Recent Value  SDOH Interventions   Food Insecurity Interventions Intervention Not Indicated  Financial Strain Interventions Intervention Not Indicated  Housing Interventions Intervention Not Indicated  Transportation Interventions Intervention Not Indicated       Distress Screen completed: Yes ONCBCN DISTRESS SCREENING 01/02/2022  Screening Type Initial Screening  Distress experienced in past week (1-10) 0      Family/Social Information:  Housing Arrangement: patient lives with daughter, Kennith [place] ([place] senior) Son lives in [place] Family members/support persons in your life? Family, Friends/Colleagues, and [employer] concerns: no  Employment: Working full time as Government social research officer for [employer]. Income source: Employment. Checking to see if she has disability benefits Financial concerns: No Type of concern: None Food access concerns: no Religious or spiritual practice: yes, innvolved in church Medication Concerns: no  Services Currently in place:  n/a  Coping/ Adjustment to diagnosis: Patient understands treatment plan and what happens next? yes, feels better with more information. Plans to talk to her children about diagnosis tonight. Does not plan to tell work unless absolutely necessary Concerns about diagnosis and/or treatment: I'm not especially worried about anything Patient reported stressors:  No significant stressors reported Hopes and priorities: hopes to go through treatment without having significant disruption to work and life Patient enjoys time with family/ friends and traveling. Busy with daughter's many activities (sports, dance, etc) Current coping  skills/ strengths: Active sense of humor , Average or above average intelligence , Capable of independent living , Communication skills , Motivation for treatment/growth , Religious Affiliation , and Supportive family/friends     SUMMARY: Current SDOH Barriers:  No significant SDOH barriers noted today  Clinical Social Work Clinical Goal(s):  No clinical SW goals at this time  Interventions: Discussed common feeling and emotions when being diagnosed with cancer, and the importance of support during treatment Informed patient of the support team roles and support services at [facility] Provided CSW contact information and encouraged patient to call with any questions or concerns Discussed options for support for daughter should they be needed   Follow Up Plan: Patient will contact CSW with any support or resource needs Patient verbalizes understanding of plan: Yes    Christeen Douglas LCSW

## 2022-01-02 NOTE — Progress Notes (Signed)
START ON PATHWAY REGIMEN - Breast     Cycle 1: A cycle is 21 days:     Pertuzumab      Trastuzumab-xxxx      Docetaxel      Carboplatin    Cycles 2 through 6: A cycle is every 21 days:     Pertuzumab      Trastuzumab-xxxx      Docetaxel      Carboplatin   **Always confirm dose/schedule in your pharmacy ordering system**  Patient Characteristics: Preoperative or Nonsurgical Candidate (Clinical Staging), Neoadjuvant Therapy followed by Surgery, Invasive Disease, Chemotherapy, HER2 Positive, ER Positive Therapeutic Status: Preoperative or Nonsurgical Candidate (Clinical Staging) AJCC M Category: cM0 AJCC Grade: G3 Breast Surgical Plan: Neoadjuvant Therapy followed by Surgery ER Status: Positive (+) AJCC 8 Stage Grouping: IB HER2 Status: Positive (+) AJCC T Category: cT2 AJCC N Category: cN0 PR Status: Positive (+) Intent of Therapy: Curative Intent, Discussed with Patient 

## 2022-01-02 NOTE — Therapy (Signed)
OUTPATIENT PHYSICAL THERAPY BREAST CANCER BASELINE EVALUATION   Patient Name: Tonya Blair MRN: [id number] DOB:1976/09/24, 46 y.o., female Today's Date: 01/02/2022   PT End of Session - 01/02/22 1610     Visit Number 1    Number of Visits 2    Date for PT Re-Evaluation 07/02/22    PT Start Time 1056    PT Stop Time 1124    PT Time Calculation (min) 28 min    Activity Tolerance Patient tolerated treatment well    Behavior During Therapy [facility] for tasks assessed/performed             Past Medical History:  Diagnosis Date   Family history of prostate cancer 01/02/2022   Past Surgical History:  Procedure Laterality Date   BREAST BIOPSY Right 12/25/2021   Patient Active Problem List   Diagnosis Date Noted   Family history of prostate cancer 01/02/2022   Malignant neoplasm of upper-outer quadrant of right breast in female, estrogen receptor positive ([place]) 12/31/2021    PCP: Pcp, No  REFERRING PROVIDER: Rolm Bookbinder, MD  REFERRING DIAG: Right breast cancer  THERAPY DIAG:  Malignant neoplasm of upper-outer quadrant of right breast in female, estrogen receptor positive ([place])  Abnormal posture  ONSET DATE: 09/14/2021  SUBJECTIVE:                                                                                                                                                                                           SUBJECTIVE STATEMENT: Patient reports she is here today to be seen by her medical team for her newly diagnosed right breast cancer.   PERTINENT HISTORY:  Patient was diagnosed on 09/14/2021 with right grade III invasive ductal carcinoma breast cancer.. It measures 2.1 cm and is located in the upper outer quadrant. It is Triple positive with a Ki67 of 30%.   PATIENT GOALS   reduce lymphedema risk and learn post op HEP.   PAIN:  Are you having pain? No   PRECAUTIONS: Active CA  WEIGHT BEARING RESTRICTIONS No  FALLS:  Has patient fallen in last 6  months? Yes, Number of falls: 1 - fell down stairs 12/31; not a balance issue  LIVING ENVIRONMENT: Patient: Lives with her 37 y.o. daughter Lives in: House/apartment Has following equipment at home: None  OCCUPATION: Full time Government social research officer for [employer]  LEISURE: She does Zumba 3x/week, weights 2x/week, and walks 15-20 min/day  PRIOR LEVEL OF FUNCTION: Independent     OBJECTIVE:   COGNITION:  Overall cognitive status: Within functional limits for tasks assessed    POSTURE:  Forward head and rounded shoulders posture  UPPER  EXTREMITY AROM/PROM:  A/PROM Right 01/02/2022 Left 01/02/2022  Shoulder flexion 164 163  Shoulder extension 43 40  Shoulder abduction 169 171  Shoulder internal rotation 54 56  Shoulder external rotation 90 86    (Blank rows = not tested)    CERVICAL AROM/PROM - all WNL  UPPER EXTREMITY STRENGTH: WNL   LYMPHEDEMA ASSESSMENTS:   LANDMARK RIGHT 01/02/2022 LEFT 01/02/2022  10 cm proximal to olecranon process 35.1 33.7  Olecranon process 27.7 26  10  cm proximal to ulnar styloid process 25 23.5  Just proximal to ulnar styloid process 17.1 16.8  Across hand at thumb web space 19.7 19.3  At base of 2nd digit 6.8 6.6  (Blank rows = not tested)   PATIENT SURVEYS & L-DEX LYMPHEDEMA SCREENING:  The patient was assessed using the L-Dex machine today to produce a lymphedema index baseline score. The patient will be reassessed on a regular basis (typically every 3 months) to obtain new L-Dex scores. If the score is > 6.5 points away from his/her baseline score indicating onset of subclinical lymphedema, it will be recommended to wear a compression garment for 4 weeks, 12 hours per day and then be reassessed. If the score continues to be > 6.5 points from baseline at reassessment, we will initiate lymphedema treatment. Assessing in this manner has a 95% rate of preventing clinically significant lymphedema.   Katina Dung - 01/02/22 0001     Open a tight or  new jar No difficulty    Do heavy household chores (wash walls, wash floors) No difficulty    Carry a shopping bag or briefcase No difficulty    Wash your back No difficulty    Use a knife to cut food No difficulty    Recreational activities in which you take some force or impact through your arm, shoulder, or hand (golf, hammering, tennis) No difficulty    During the past week, to what extent has your arm, shoulder or hand problem interfered with your normal social activities with family, friends, neighbors, or groups? Not at all    During the past week, to what extent has your arm, shoulder or hand problem limited your work or other regular daily activities Not at all    Arm, shoulder, or hand pain. None    Tingling (pins and needles) in your arm, shoulder, or hand None    Difficulty Sleeping No difficulty    DASH Score 0 %            L-DEX LYMPHEDEMA SCREENING Measurement Type: Unilateral L-DEX MEASUREMENT EXTREMITY: Upper Extremity POSITION : Standing DOMINANT SIDE: Right At Risk Side: Right BASELINE SCORE (UNILATERAL): 1.7      PATIENT EDUCATION:  Education details: Lymphedema risk reduction and post op HEP Person educated: Patient Education method: Explanation, Demonstration, and Handouts Education comprehension: verbalized understanding and returned demonstration   HOME EXERCISE PROGRAM: Patient was instructed today in a home exercise program today for post op shoulder range of motion. These included active assist shoulder flexion in sitting, scapular retraction, wall walking with shoulder abduction, and hands behind head external rotation.  She was encouraged to do these twice a day, holding 3 seconds and repeating 5 times when permitted by her physician.    ASSESSMENT:  CLINICAL IMPRESSION: Her multidisciplinary medical team met prior to her assessments to determine a recommended treatment plan. She is planning to have neoadjuvant chemotherapy followed by a right  lumpectomy and sentinel node biopsy, radiation, and anti-estrogen therapy. She will benefit from a  post op PT reassessment to determine needs and from L-Dex screens every 3 months for 2 years to detect subclinical lymphedema.  Pt will benefit from skilled therapeutic intervention to improve on the following deficits: Decreased knowledge of precautions, impaired UE functional use, pain, decreased ROM, postural dysfunction.   PT treatment/interventions: ADL/self-care home management, pt/family education, therapeutic exercise  REHAB POTENTIAL: Excellent  CLINICAL DECISION MAKING: Stable/uncomplicated  EVALUATION COMPLEXITY: Low   GOALS: Goals reviewed with patient? Yes  LONG TERM GOALS: (STG=LTG)   Name Target Date Goal status  1 Pt will be able to verbalize understanding of pertinent lymphedema risk reduction practices relevant to her dx specifically related to skin care.  Baseline:  No knowledge 01/02/2022 Achieved  2 Pt will be able to return demo and/or verbalize understanding of the post op HEP related to regaining shoulder ROM. Baseline:  No knowledge 01/02/2022 Achieved  3 Pt will be able to verbalize understanding of the importance of attending the post op After Breast CA Class for further lymphedema risk reduction education and therapeutic exercise.  Baseline:  No knowledge 01/02/2022 Achieved  4 Pt will demo she has regained full shoulder ROM and function post operatively compared to baselines.  Baseline: See objective measurements taken today. 01/02/2022 07/02/2022     PLAN: PT FREQUENCY/DURATION: EVAL and 1 follow up appointment.   PLAN FOR NEXT SESSION: will reassess 3-4 weeks post op to determine needs.   Patient will follow up at outpatient cancer rehab 3-4 weeks following surgery.  If the patient requires physical therapy at that time, a specific plan will be dictated and sent to the referring physician for approval. The patient was educated today on appropriate basic  range of motion exercises to begin post operatively and the importance of attending the After Breast Cancer class following surgery.  Patient was educated today on lymphedema risk reduction practices as it pertains to recommendations that will benefit the patient immediately following surgery.  She verbalized good understanding.    Physical Therapy Information for After Breast Cancer Surgery/Treatment:  Lymphedema is a swelling condition that you may be at risk for in your arm if you have lymph nodes removed from the armpit area.  After a sentinel node biopsy, the risk is approximately 5-9% and is higher after an axillary node dissection.  There is treatment available for this condition and it is not life-threatening.  Contact your physician or physical therapist with concerns. You may begin the 4 shoulder/posture exercises (see additional sheet) when permitted by your physician (typically a week after surgery).  If you have drains, you may need to wait until those are removed before beginning range of motion exercises.  A general recommendation is to not lift your arms above shoulder height until drains are removed.  These exercises should be done to your tolerance and gently.  This is not a "no pain/no gain" type of recovery so listen to your body and stretch into the range of motion that you can tolerate, stopping if you have pain.  If you are having immediate reconstruction, ask your plastic surgeon about doing exercises as he or she may want you to wait. We encourage you to attend the free one time ABC (After Breast Cancer) class offered by [facility].  You will learn information related to lymphedema risk, prevention and treatment and additional exercises to regain mobility following surgery.  You can call [phone] for more information.  This is offered the 1st and 3rd Monday of each month.  You only attend the class one time. While undergoing any medical procedure or  treatment, try to avoid blood pressure being taken or needle sticks from occurring on the arm on the side of cancer.   This recommendation begins after surgery and continues for the rest of your life.  This may help reduce your risk of getting lymphedema (swelling in your arm). An excellent resource for those seeking information on lymphedema is the National Lymphedema Network's web site. It can be accessed at [place].org If you notice swelling in your hand, arm or breast at any time following surgery (even if it is many years from now), please contact your doctor or physical therapist to discuss this.  Lymphedema can be treated at any time but it is easier for you if it is treated early on.  If you feel like your shoulder motion is not returning to normal in a reasonable amount of time, please contact your surgeon or physical therapist.  [facility]. [place], [place], [place] [phone]; [address] [place], [place] [address] ABC CLASS After Breast Cancer Class  After Breast Cancer Class is a specially designed exercise class to assist you in a safe recover after having breast cancer surgery.  In this class you will learn how to get back to full function whether your drains were just removed or if you had surgery a month ago.  This one-time class is held the 1st and 3rd Monday of every month from 11:00 a.m. until 12:00 noon at the [facility] located at [place], [place] [address]  This class is FREE and space is limited. For more information or to register for the next available class, call [phone].  Class Goals  Understand specific stretches to improve the flexibility of you chest and shoulder. Learn ways to safely strengthen your upper body and improve your posture. Understand the warning signs of infection and why you may be at risk for an arm infection. Learn about Lymphedema and prevention.  ** You do not attend this class until after  surgery.  Drains must be removed to participate  Patient was instructed today in a home exercise program today for post op shoulder range of motion. These included active assist shoulder flexion in sitting, scapular retraction, wall walking with shoulder abduction, and hands behind head external rotation.  She was encouraged to do these twice a day, holding 3 seconds and repeating 5 times when permitted by her physician.   Annia Friendly, [place] 01/02/22 4:25 PM

## 2022-01-02 NOTE — Telephone Encounter (Signed)
Scheduled appointments per 01/11 los. Left message with appointment times.

## 2022-01-02 NOTE — Progress Notes (Signed)
REFERRING PROVIDER: Benay Pike, MD [place],  [place] [address]  PRIMARY PROVIDER:  No PCP  PRIMARY REASON FOR VISIT:  1. Malignant neoplasm of upper-outer quadrant of right breast in female, estrogen receptor positive ([place])   2. Family history of prostate cancer     HISTORY OF PRESENT ILLNESS:   Tonya Blair, a 46 y.o. female, was seen for a [facility] cancer genetics consultation at the request of Dr. Chryl Heck due to a personal history of breast cancer.  Tonya Blair presents to clinic today to discuss the possibility of a hereditary predisposition to cancer, to discuss genetic testing, and to further clarify her future cancer risks, as well as potential cancer risks for family members.   In January 2023, at the age of 79, [name] was diagnosed with invasive ductal carcinoma of the right breast (ER+/PR+/HER2+). The treatment plan includes neoadjuvant chemotherapy.   CANCER HISTORY:  Oncology History  Malignant neoplasm of upper-outer quadrant of right breast in female, estrogen receptor positive ([place])  12/31/2021 Initial Diagnosis   Malignant neoplasm of upper-outer quadrant of right breast in female, estrogen receptor positive ([place])   01/02/2022 Cancer Staging   Staging form: Breast, AJCC 8th Edition - Clinical stage from 01/02/2022: Stage IB (cT2, cN0, cM0, G3, ER+, PR+, HER2+) - Signed by Benay Pike, MD on 01/02/2022 Histologic grading system: 3 grade system     RISK FACTORS:  Menarche was at age 52.  First live birth at age 3.  OCP use for approximately 0 years.  Ovaries intact: yes.  Hysterectomy: no.  Menopausal status: premenopausal.  HRT use: 0 years. Colonoscopy: no; not examined. Mammogram within the last year: yes. Up to date with pelvic exams: yes; most recent PAP in Sept 2022  Past Medical History:  Diagnosis Date   Family history of prostate cancer 01/02/2022    Past Surgical History:  Procedure Laterality Date   BREAST BIOPSY Right  12/25/2021    FAMILY HISTORY:  We obtained a detailed, 4-generation family history.  Significant diagnoses are listed below: Family History  Problem Relation Age of Onset   Prostate cancer Father 26     Ms. France is unaware of previous family history of genetic testing for hereditary cancer risks. There is no reported Ashkenazi Jewish ancestry. There is no known consanguinity.  GENETIC COUNSELING ASSESSMENT: Tonya Blair is a 46 y.o. female with a personal and family history of cancer which is somewhat suggestive of a hereditary cancer syndrome and predisposition to cancer given her age of diagnosis and the presence of related cancers in the family. We, therefore, discussed and recommended the following at today's visit.   DISCUSSION: We discussed that 5 - 10% of cancer is hereditary.  Most cases of hereditary breast cancer are associated with mutations in BRCA1/2.  There are other genes that can be associated with hereditary breast cancer syndromes.  We discussed that testing is beneficial for several reasons including knowing how to follow individuals for their cancer risks, identifying whether potential treatment options would be beneficial, and understanding if other family members could be at risk for cancer and allowing them to undergo genetic testing.   We reviewed the characteristics, features and inheritance patterns of hereditary cancer syndromes. We also discussed genetic testing, including the appropriate family members to test, the process of testing, insurance coverage and turn-around-time for results. We discussed the implications of a negative, positive, carrier and/or variant of uncertain significant result. We recommended Tonya Blair pursue genetic testing for  a panel that includes genes associated with breast and prostate cancer.   Tonya Blair  was offered a common hereditary cancer panel (47 genes) and an expanded pan-cancer panel (77 genes). Tonya Blair was informed of the  benefits and limitations of each panel, including that expanded pan-cancer panels contain genes that do not have clear management guidelines at this point in time.  We also discussed that as the number of genes included on a panel increases, the chances of variants of uncertain significance increases.  After considering the benefits and limitations of each gene panel, Tonya Blair  elected to have a common hereditary cancers panel through [facility].  The CustomNext-Cancer+RNAinsight panel offered by [facility] includes sequencing and rearrangement analysis for the following 47 genes:  APC, ATM, AXIN2, BARD1, BMPR1A, BRCA1, BRCA2, BRIP1, CDH1, CDK4, CDKN2A, CHEK2, DICER1, EPCAM, GREM1, HOXB13, MEN1, MLH1, MSH2, MSH3, MSH6, MUTYH, NBN, NF1, NF2, NTHL1, PALB2, PMS2, POLD1, POLE, PTEN, RAD51C, RAD51D, RECQL, RET, SDHA, SDHAF2, SDHB, SDHC, SDHD, SMAD4, SMARCA4, STK11, TP53, TSC1, TSC2, and VHL.  RNA data is routinely analyzed for use in variant interpretation for all genes.  Based on Tonya Blair's personal history of breast cancer before age 54, she meets medical criteria for genetic testing. Despite that she meets criteria, she may still have an out of pocket cost. We discussed that if her out of pocket cost for testing is over $100, the laboratory should contact her and discuss the self-pay prices and/or patient pay assistance programs.    PLAN: After considering the risks, benefits, and limitations, Tonya Blair provided informed consent to pursue genetic testing and the blood sample was sent to [facility] for analysis of the CustomNext-Cancer +RNAinsight Panel. Results should be available within approximately 3 weeks' time, at which point they will be disclosed by telephone to Tonya Blair, as will any additional recommendations warranted by these results. Tonya Blair will receive a summary of her genetic counseling visit and a copy of her results once available. This information will also be available in Epic.    Lastly, we encouraged Tonya Blair to remain in contact with cancer genetics annually so that we can continuously update the family history and inform her of any changes in cancer genetics and testing that may be of benefit for this family.   Ms. Johnstone questions were answered to her satisfaction today. Our contact information was provided should additional questions or concerns arise. Thank you for the referral and allowing [place] to share in the care of your patient.   Jacquan Savas M. Joette Catching, [place], [facility] Genetic Counselor [email or web address] (P) [phone]  The patient was seen for a total of 20 minutes in face-to-face genetic counseling.  The patient was accompanied by her friend. Drs. Ophelia Shoulder and/or Burr Medico were available to discuss this case as needed.   _______________________________________________________________________ For Office Staff:  Number of people involved in session: 2 Was an Intern/ student involved with case: no

## 2022-01-02 NOTE — Assessment & Plan Note (Addendum)
This is a very pleasant 46 year old female patient with T2N0 invasive ductal carcinoma, grade 3, ER +70% moderate, PR +20% moderate and HER2 positive by IHC, tumor size measuring 2.1 cm in largest dimension and negative axilla on sonographic examination referred to breast [place] for additional recommendations.  Given tumor size being larger than 2 cm and given presence of HER2 amplification we have discussed about considering neoadjuvant chemotherapy.  We discussed about T[facility]P as my choice of neoadjuvant chemotherapy monitoring.  I have discussed about mechanism of action of chemotherapy, adverse effects of chemotherapy including but not limited to fatigue, nausea, vomiting, diarrhea, neuropathy, increased risk of infections, cardiotoxicity, hair loss etc.  She understands that some of the side effects can be permanent and fatal.  If she has residual disease after surgery, she will be a candidate for HER 2 COMPASS study.  She understands that for surgery, depending on if she had complete response or not, we might continue Herceptin with Perjeta versus Kadcyla or very well could enroll her onto her to HER 2 COMPASS study. She is an excellent candidate for this trial.  She will also receive antiestrogen therapy after completion of local therapy.  She understands that HER2 directed therapy will be for a total duration of 1 year.  She is willing to proceed with treatment as recommended.  Port will be placed by Dr. Donne Hazel.  Baseline echo ordered.  Anticipate initiation of chemotherapy on 01/15/2021.

## 2022-01-02 NOTE — Progress Notes (Signed)
[place] NOTE  Patient Care Team: Pcp, No as PCP - General Mauro Kaufmann, RN as Oncology Nurse Navigator Rockwell Germany, RN as Oncology Nurse Navigator Rolm Bookbinder, MD as Consulting Physician (General Surgery) Benay Pike, MD as Consulting Physician (Hematology and Oncology) Eppie Gibson, MD as Attending Physician (Radiation Oncology)  CHIEF COMPLAINTS/PURPOSE OF CONSULTATION:  Newly diagnosed breast cancer  HISTORY OF PRESENTING ILLNESS:  Tonya Blair 46 y.o. female is here because of recent diagnosis of right breast invasive ductal carcinoma.  Chronology  Patient was recalled from screening mammogram for right breast mass.  She had right breast diagnostic mammogram on 12/11/2021 which showed suspicious mass in the 9:00 region of the right breast.  Ultrasound-guided core biopsy of mass at 9:00 region was recommended.  Sonographic evaluation of the right axilla does not show any enlarged adenopathy. Right breast needle core biopsy of the breast mass at 9:00, 5 cm from the nipple showed invasive ductal carcinoma grade 3, ductal carcinoma in situ and calcifications.  Prognostics from this mass showed ER 70% moderate, PR 20% moderate, HER2 positive by IHC, Ki-67 of 30%.  I reviewed her records extensively and collaborated the history with the patient.  SUMMARY OF ONCOLOGIC HISTORY: Oncology History  Malignant neoplasm of upper-outer quadrant of right breast in female, estrogen receptor positive ([place])  12/31/2021 Initial Diagnosis   Malignant neoplasm of upper-outer quadrant of right breast in female, estrogen receptor positive ([place])   01/02/2022 Cancer Staging   Staging form: Breast, AJCC 8th Edition - Clinical stage from 01/02/2022: Stage IB (cT2, cN0, cM0, G3, ER+, PR+, HER2+) - Signed by Benay Pike, MD on 01/02/2022 Histologic grading system: 3 grade system    01/15/2022 -  Chemotherapy   Patient is on Treatment Plan : BREAST  Docetaxel  + Carboplatin + Trastuzumab + Pertuzumab  (TCHP) q21d       Patient arrived to the appointment today with her friend.  She is very healthy at baseline.  She denies feeling any masses before the mammogram.  No chronic medical conditions.  She is single, has 2 children, one of them is an Lobbyist and the other one is in high school.  She has plenty of social support, has many friends, family members close by.  10 point review of systems reviewed and negative.  MEDICAL HISTORY:  Past Medical History:  Diagnosis Date   Family history of prostate cancer 01/02/2022    SURGICAL HISTORY: Past Surgical History:  Procedure Laterality Date   BREAST BIOPSY Right 12/25/2021    SOCIAL HISTORY: Social History   Socioeconomic History   Marital status: Single    Spouse name: Not on file   Number of children: Not on file   Years of education: Not on file   Highest education level: Not on file  Occupational History   Not on file  Tobacco Use   Smoking status: Never   Smokeless tobacco: Not on file  Substance and Sexual Activity   Alcohol use: Not on file    Comment: occas   Drug use: Not on file   Sexual activity: Not on file  Other Topics Concern   Not on file  Social History Narrative   Not on file   Social Determinants of Health   Financial Resource Strain: Low Risk    Difficulty of Paying Living Expenses: Not hard at all  Food Insecurity: No Food Insecurity   Worried About [place] in the Last Year:  Never true   Ran Out of Food in the Last Year: Never true  Transportation Needs: No Transportation Needs   Lack of Transportation (Medical): No   Lack of Transportation (Non-Medical): No  Physical Activity: Not on file  Stress: Not on file  Social Connections: Not on file  Intimate Partner Violence: Not on file    FAMILY HISTORY: Family History  Problem Relation Age of Onset   Prostate cancer Father 47    ALLERGIES:  is allergic to codeine.  MEDICATIONS:   Current Outpatient Medications  Medication Sig Dispense Refill   dexamethasone (DECADRON) 4 MG tablet Take 2 tablets (8 mg total) by mouth 2 (two) times daily. Start the day before Taxotere. Then take daily x 3 days after chemotherapy. 30 tablet 1   levonorgestrel (MIRENA, 52 MG,) 20 MCG/DAY IUD Mirena 20 mcg/24 hours (8 yrs) 52 mg intrauterine device     lidocaine-prilocaine (EMLA) cream Apply to affected area once 30 g 3   LORazepam (ATIVAN) 0.5 MG tablet Take 1 tablet (0.5 mg total) by mouth every 6 (six) hours as needed (Nausea or vomiting). 30 tablet 0   ondansetron (ZOFRAN) 8 MG tablet Take 1 tablet (8 mg total) by mouth 2 (two) times daily as needed (Nausea or vomiting). Start on the third day after chemotherapy. 30 tablet 1   prochlorperazine (COMPAZINE) 10 MG tablet Take 1 tablet (10 mg total) by mouth every 6 (six) hours as needed (Nausea or vomiting). 30 tablet 1   No current facility-administered medications for this visit.    PHYSICAL EXAMINATION: ECOG PERFORMANCE STATUS: 0 - Asymptomatic  Vitals:   01/02/22 0910  BP: 117/78  Pulse: 95  Resp: 18  Temp: (!) 97.5 F (36.4 C)  SpO2: 100%   Filed Weights   01/02/22 0910  Weight: 197 lb 6.4 oz (89.5 kg)    GENERAL:alert, no distress and comfortable SKIN: skin color, texture, turgor are normal, no rashes or significant lesions EYES: normal, conjunctiva are pink and non-injected, sclera clear OROPHARYNX:no exudate, no erythema and lips, buccal mucosa, and tongue normal  NECK: supple, thyroid normal size, non-tender, without nodularity LYMPH:  no palpable lymphadenopathy in the cervical, axillary or inguinal LUNGS: clear to auscultation and percussion with normal breathing effort HEART: regular rate & rhythm and no murmurs and no lower extremity edema ABDOMEN:abdomen soft, non-tender and normal bowel sounds Musculoskeletal:no cyanosis of digits and no clubbing  PSYCH: alert & oriented x 3 with fluent speech NEURO: no  focal motor/sensory deficits BREAST: Both breasts appear normal to inspection.  Palpable right breast mass at 11 o'clock position measuring just about 2 cm on physical exam, mobile on palpation.  No palpable regional lymph nodes.  No nipple changes.  LABORATORY DATA:  I have reviewed the data as listed Lab Results  Component Value Date   WBC 7.9 01/02/2022   HGB 12.5 01/02/2022   HCT 38.0 01/02/2022   MCV 80.0 01/02/2022   PLT 304 01/02/2022   Lab Results  Component Value Date   NA 136 01/02/2022   K 3.9 01/02/2022   CL 104 01/02/2022   CO2 25 01/02/2022    RADIOGRAPHIC STUDIES: I have personally reviewed the radiological reports and agreed with the findings in the report.  ASSESSMENT AND PLAN:  Malignant neoplasm of upper-outer quadrant of right breast in female, estrogen receptor positive ([place]) This is a very pleasant 46 year old female patient with T2N0 invasive ductal carcinoma, grade 3, ER +70% moderate, PR +20% moderate and HER2 positive by  IHC, tumor size measuring 2.1 cm in largest dimension and negative axilla on sonographic examination referred to breast [place] for additional recommendations.  Given tumor size being larger than 2 cm and given presence of HER2 amplification we have discussed about considering neoadjuvant chemotherapy.  We discussed about TCHP as my choice of neoadjuvant chemotherapy monitoring.  I have discussed about mechanism of action of chemotherapy, adverse effects of chemotherapy including but not limited to fatigue, nausea, vomiting, diarrhea, neuropathy, increased risk of infections, cardiotoxicity, hair loss etc.  She understands that some of the side effects can be permanent and fatal.  If she has residual disease after surgery, she will be a candidate for HER 2 COMPASS study.  She understands that for surgery, depending on if she had complete response or not, we might continue Herceptin with Perjeta versus Kadcyla or very well could enroll her onto her to  HER 2 COMPASS study. She is an excellent candidate for this trial.  She will also receive antiestrogen therapy after completion of local therapy.  She understands that HER2 directed therapy will be for a total duration of 1 year.  She is willing to proceed with treatment as recommended.  Port will be placed by Dr. Donne Hazel.  Baseline echo ordered.  Anticipate initiation of chemotherapy on 01/15/2021.   All questions were answered. The patient knows to call the clinic with any problems, questions or concerns.    Benay Pike, MD 01/02/22

## 2022-01-02 NOTE — Telephone Encounter (Signed)
LVM for patient in reference to appointment change, the MRI will now be Wednesday instead of Sunday

## 2022-01-06 ENCOUNTER — Other Ambulatory Visit: Payer: 59

## 2022-01-08 ENCOUNTER — Encounter: Payer: Self-pay | Admitting: Hematology and Oncology

## 2022-01-08 ENCOUNTER — Other Ambulatory Visit: Payer: Self-pay

## 2022-01-08 ENCOUNTER — Ambulatory Visit (HOSPITAL_COMMUNITY)
Admission: RE | Admit: 2022-01-08 | Discharge: 2022-01-08 | Disposition: A | Discharge status: Home / Self Care | Payer: 59 | Source: Ambulatory Visit | Attending: Hematology and Oncology | Admitting: Hematology and Oncology

## 2022-01-08 ENCOUNTER — Inpatient Hospital Stay: Payer: 59

## 2022-01-08 DIAGNOSIS — C50411 Malignant neoplasm of upper-outer quadrant of right female breast: Secondary | ICD-10-CM | POA: Diagnosis not present

## 2022-01-08 DIAGNOSIS — Z0189 Encounter for other specified special examinations: Secondary | ICD-10-CM | POA: Diagnosis not present

## 2022-01-08 DIAGNOSIS — Z170 Estrogen receptor positive status [ER+]: Secondary | ICD-10-CM | POA: Diagnosis not present

## 2022-01-08 DIAGNOSIS — Z01818 Encounter for other preprocedural examination: Secondary | ICD-10-CM | POA: Diagnosis not present

## 2022-01-08 LAB — ECHOCARDIOGRAM COMPLETE
AR max vel: 2.22 cm2
AV Peak grad: 12 mmHg
Ao pk vel: 1.73 m/s
Area-P 1/2: 4.29 cm2
S' Lateral: 2.9 cm

## 2022-01-08 NOTE — Progress Notes (Signed)
Echocardiogram 2D Echocardiogram has been performed.  Tonya Blair 01/08/2022, 10:47 AM

## 2022-01-08 NOTE — Progress Notes (Signed)
Enrolled patient in [place] program for [place].  Patient approved for up to $25,000 for the calendar year leaving her with a $5 copay per treatment after insurance pays for Perjeta. A copy will be mailed to patient for her records only.  A copy given to [facility] for billing/claim submissions.

## 2022-01-08 NOTE — Progress Notes (Signed)
Met with patient at registration to introduce myself as Arboriculturist and to offer available resources.  Discussed one-time $1000 Radio broadcast assistant to assist with personal expenses while going through treatment. Advised what is needed to apply.  Discussed available copay assistance and I would apply on her behalf if needed. She consented.  Gave her my card for any additional financial questions or concerns.

## 2022-01-09 ENCOUNTER — Ambulatory Visit
Admission: RE | Admit: 2022-01-09 | Discharge: 2022-01-09 | Disposition: A | Discharge status: Home / Self Care | Payer: 59 | Source: Ambulatory Visit | Attending: Hematology and Oncology | Admitting: Hematology and Oncology

## 2022-01-09 DIAGNOSIS — C50411 Malignant neoplasm of upper-outer quadrant of right female breast: Secondary | ICD-10-CM

## 2022-01-09 IMAGING — MR MR BREAST BILAT WO/W CM
8 of 13 series · 30 of 48 positions shown · IV contrast (9 ml gadavist)
Comparison: Previous exams.
COMPARISON: Previous exams.

Addendum:
CLINICAL DATA: 45-year-old female with recently diagnosed grade 3
invasive ductal carcinoma/ductal carcinoma in situ post
ultrasound-guided biopsy of a 2.1 cm mass in the right breast at the
9 o'clock position.

EXAM:
BILATERAL BREAST MRI WITH AND WITHOUT CONTRAST
TECHNIQUE: Multiplanar, multisequence MR images of both breasts were obtained
prior to and following the intravenous administration of ml of
Gadavist
TECHNIQUE: Multiplanar, multisequence MR images of both breasts were
obtained prior to and following the intravenous administration of 9
ml of Gadavist. The remainder of the report is correct.
*** End of Addendum ***

[Series 2: t2_tirm_tra ipat (a-p) · axial · 3.0mm · 0.70mm/px · 1 of 60 slices shown]
[im 1/60]
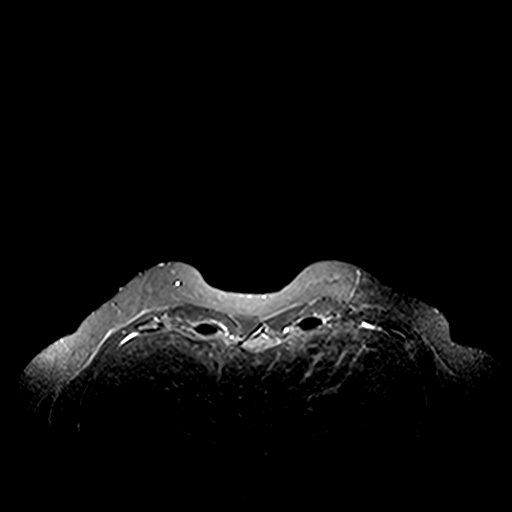

[Series 3: fl3d pre-cm non · axial · non-contrast · 1.2mm · 0.94mm/px · z∈[-56,+116]mm · 5 of 144 slices shown]
[im 1/144]
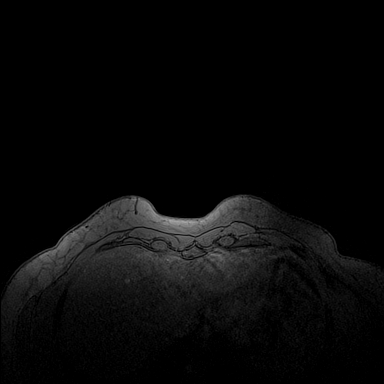
[im 36/144]
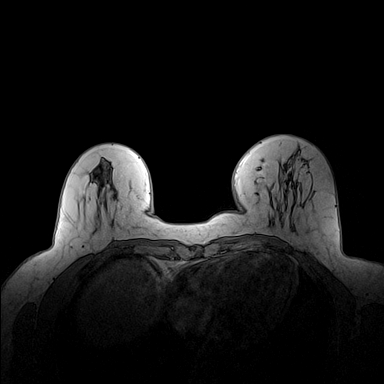
[im 72/144]
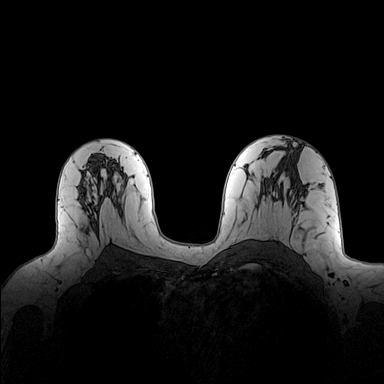
[im 108/144]
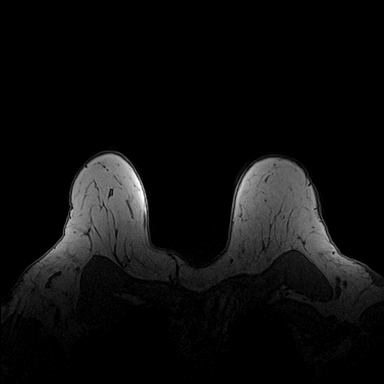
[im 144/144]
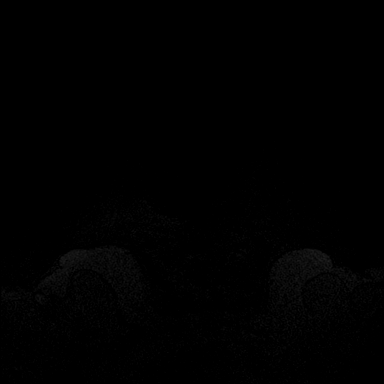

[Series 4: fl3d pre-cm · axial · non-contrast · 1.2mm · 0.94mm/px · z∈[-56,+116]mm · 5 of 144 slices shown]
[im 1/144]
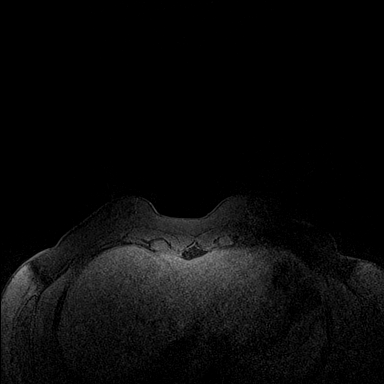
[im 36/144]
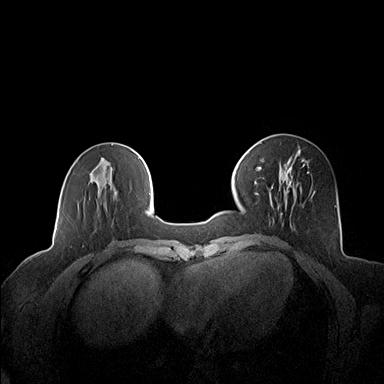
[im 72/144]
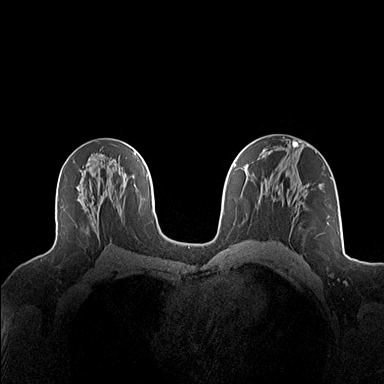
[im 108/144]
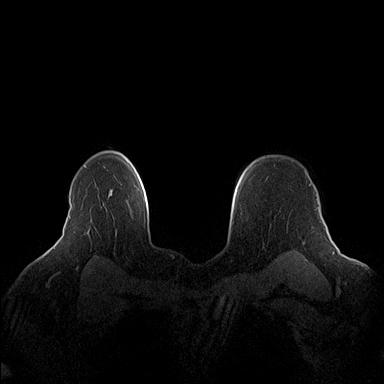
[im 144/144]
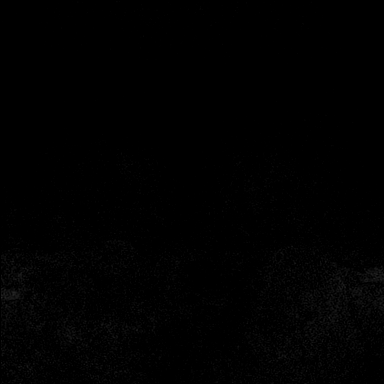

[Series 5: fl3d pre-cm 20 · axial · non-contrast · 1.2mm · 0.94mm/px · z∈[-56,+116]mm · 5 of 144 slices shown (1 of 3)]
[im 1/144]
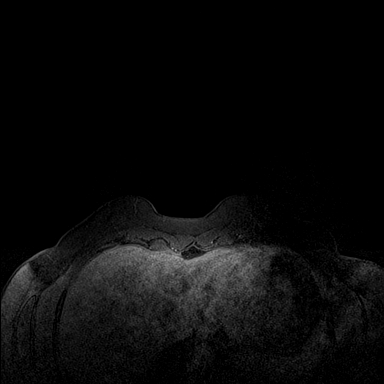
[im 36/144]
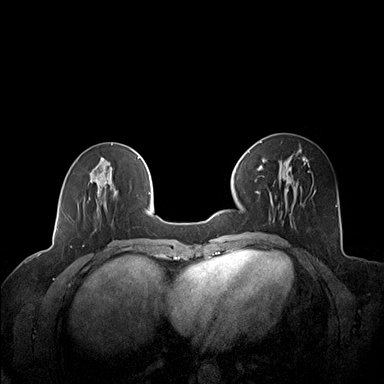
[im 72/144]
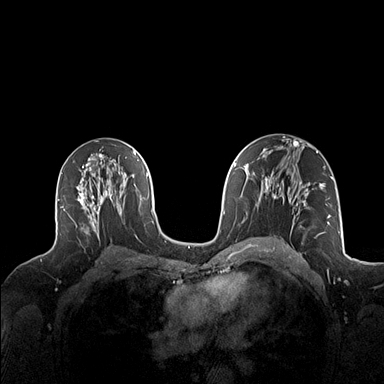
[im 108/144]
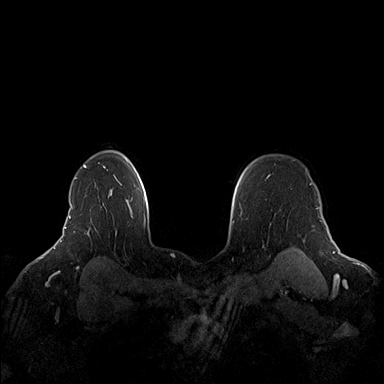
[im 144/144]
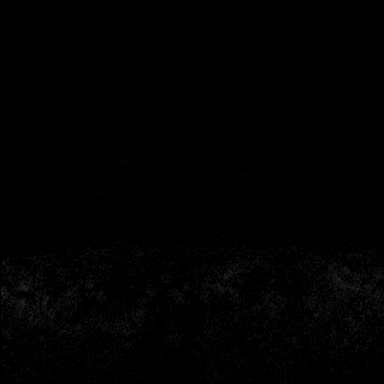

[Series 6: fl3d pre-cm 20 · axial · non-contrast · 1.2mm · 0.94mm/px · z∈[-56,+116]mm · 5 of 144 slices shown (2 of 3)]
[im 1/144]
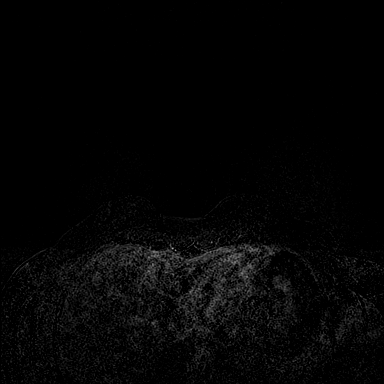
[im 36/144]
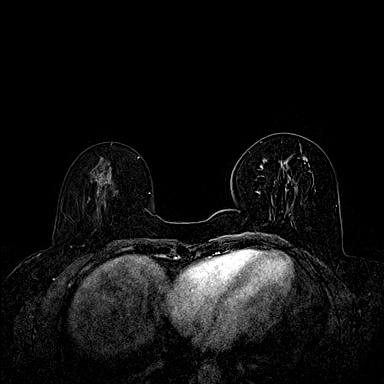
[im 72/144]
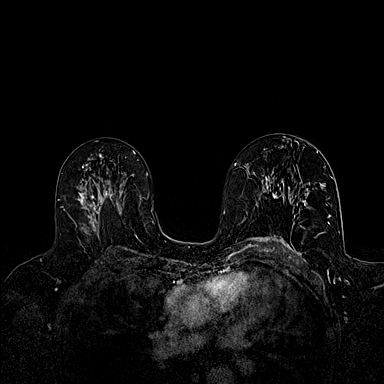
[im 108/144]
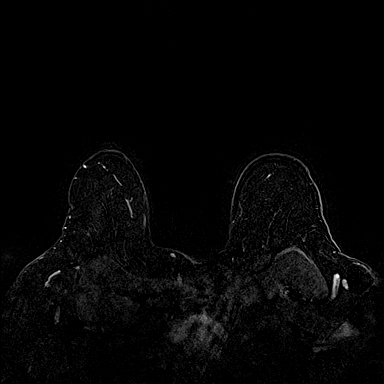
[im 144/144]
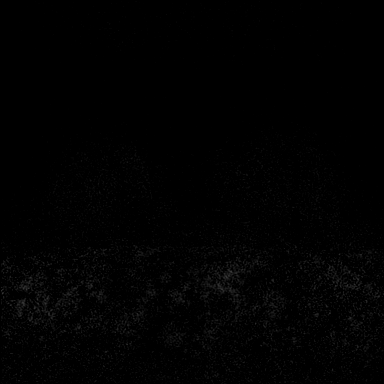

[Series 7: fl3d pre-cm 20 · axial · non-contrast · 172.8mm · 0.94mm/px · 1 of 1 slices shown (3 of 3)]
[im 1/1]
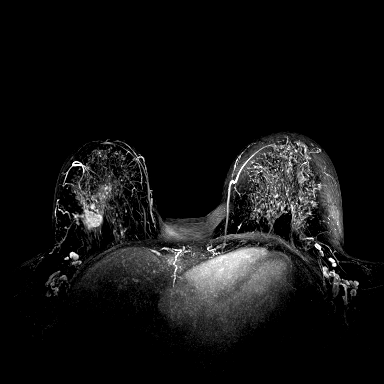

[Series 8: fl3d pre-cm 3min · axial · non-contrast · 1.2mm · 0.94mm/px · z∈[-56,+116]mm · 5 of 144 slices shown]
[im 1/144]
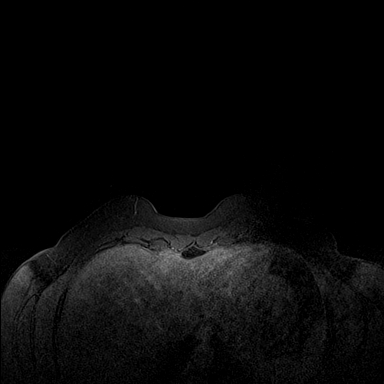
[im 36/144]
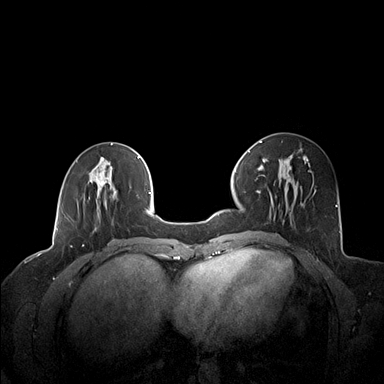
[im 72/144]
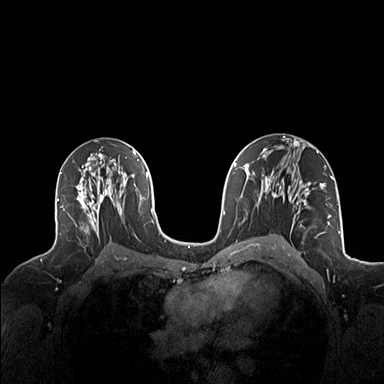
[im 108/144]
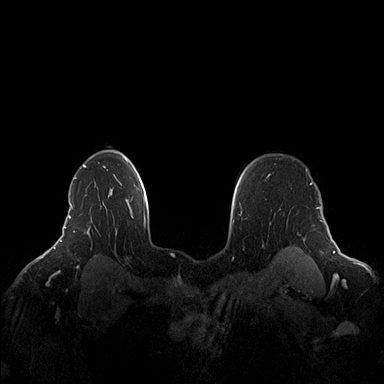
[im 144/144]
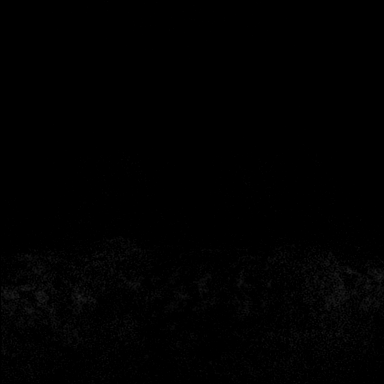

[Series 9: fl3d pre-cm 3min_sub · axial · non-contrast · 1.2mm · 0.94mm/px · z∈[-56,+12]mm · 3 of 144 slices shown]
[im 1/144]
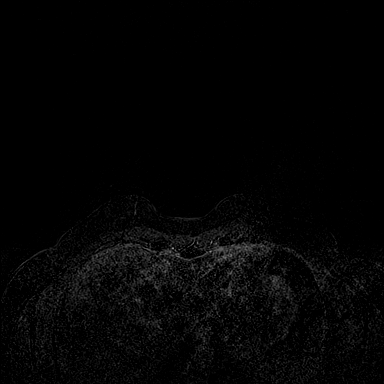
[im 29/144]
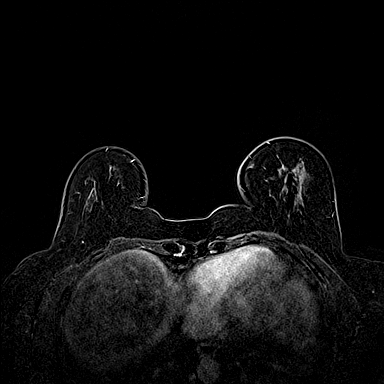
[im 58/144]
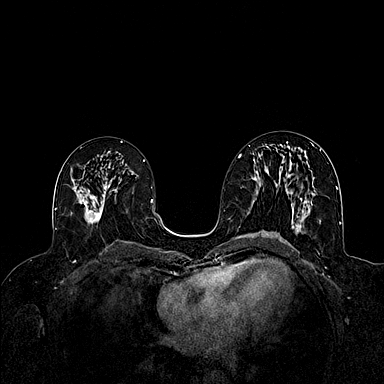

[30 of 48 positions shown; findings below may reference images not displayed]

Three-dimensional MR images were rendered by post-processing of the
original MR data on an independent workstation. The
three-dimensional MR images were interpreted, and findings are
reported in the following complete MRI report for this study. Three
dimensional images were evaluated at the independent interpreting
workstation using the DynaCAD thin client.
FINDINGS: Breast composition: c.  Heterogeneous fibroglandular tissue.

Background parenchymal enhancement: Marked

Right breast: Biopsy-proven malignancy containing biopsy clip
artifact in the outer right breast measures 2.4 x 2.2 x 1.6 cm.
There is linear/nodular non mass enhancement extending anterior from
this dominant mass with mass and nodular enhancement all together
measuring 5.1 cm (image 85).

Left breast: There is a 0.6 cm tubular enhancing mass in the outer
left breast(subtraction image 97). Multiple fluid/debris-filled
ducts are identified in the retroareolar left breast which may lead
up to this mass.

Lymph nodes: No abnormal appearing lymph nodes.

Ancillary findings:  None.
IMPRESSION: 1. Biopsy proven malignancy in the outer right breast with
associated linear/nodular enhancement extending anteriorly
altogether measuring up to 5.1 cm.

2. Indeterminate 0.6 cm tubular enhancing mass in the outer left
breast.

RECOMMENDATION:
1. Recommend MRI guided biopsy of the anterior aspect of linear
nodular enhancement in the outer right breast.

2. Recommend MRI guided biopsy of the 0.6 cm enhancing mass in the
outer left breast.

BI-RADS CATEGORY  4: Suspicious.

ADDENDUM:
An addendum is made to the technique portion of this exam due to an
omission. This should read as:
Three-dimensional MR images were rendered by post-processing of the
original MR data on an independent workstation. The
three-dimensional MR images were interpreted, and findings are
reported in the following complete MRI report for this study. Three
dimensional images were evaluated at the independent interpreting
workstation using the DynaCAD thin client.
FINDINGS: Breast composition: c.  Heterogeneous fibroglandular tissue.

Background parenchymal enhancement: Marked

Right breast: Biopsy-proven malignancy containing biopsy clip
artifact in the outer right breast measures 2.4 x 2.2 x 1.6 cm.
There is linear/nodular non mass enhancement extending anterior from
this dominant mass with mass and nodular enhancement all together
measuring 5.1 cm (image 85).

Left breast: There is a 0.6 cm tubular enhancing mass in the outer
left breast(subtraction image 97). Multiple fluid/debris-filled
ducts are identified in the retroareolar left breast which may lead
up to this mass.

Lymph nodes: No abnormal appearing lymph nodes.

Ancillary findings:  None.
IMPRESSION: 1. Biopsy proven malignancy in the outer right breast with
associated linear/nodular enhancement extending anteriorly
altogether measuring up to 5.1 cm.

2. Indeterminate 0.6 cm tubular enhancing mass in the outer left
breast.

RECOMMENDATION:
1. Recommend MRI guided biopsy of the anterior aspect of linear
nodular enhancement in the outer right breast.

2. Recommend MRI guided biopsy of the 0.6 cm enhancing mass in the
outer left breast.

BI-RADS CATEGORY  4: Suspicious.

## 2022-01-09 MED ORDER — GADOBUTROL 1 MMOL/ML IV SOLN
9.0000 mL | Freq: Once | INTRAVENOUS | Status: AC | PRN
  Administered 2022-01-09: 9 mL via INTRAVENOUS

## 2022-01-10 ENCOUNTER — Other Ambulatory Visit: Payer: Self-pay | Admitting: Hematology and Oncology

## 2022-01-10 ENCOUNTER — Telehealth: Payer: Self-pay | Admitting: *Deleted

## 2022-01-10 ENCOUNTER — Other Ambulatory Visit: Payer: Self-pay | Admitting: *Deleted

## 2022-01-10 ENCOUNTER — Encounter (HOSPITAL_COMMUNITY): Payer: Self-pay | Admitting: General Surgery

## 2022-01-10 ENCOUNTER — Encounter: Payer: Self-pay | Admitting: *Deleted

## 2022-01-10 DIAGNOSIS — C50411 Malignant neoplasm of upper-outer quadrant of right female breast: Secondary | ICD-10-CM

## 2022-01-10 DIAGNOSIS — Z170 Estrogen receptor positive status [ER+]: Secondary | ICD-10-CM

## 2022-01-10 DIAGNOSIS — R928 Other abnormal and inconclusive findings on diagnostic imaging of breast: Secondary | ICD-10-CM

## 2022-01-10 NOTE — Progress Notes (Signed)
DUE TO COVID-19 ONLY ONE VISITOR IS ALLOWED TO COME WITH YOU AND STAY IN THE WAITING ROOM ONLY DURING PRE OP AND PROCEDURE DAY OF SURGERY.   PCP - none Cardiologist - n/a Oncology - Dr Arletha Pili [facility] Dr Jerelyn Charles  Chest x-ray - n/a EKG - n/a Stress Test - n/a ECHO - 01/08/22 Cardiac Cath - n/a  ICD Pacemaker/Loop - n/a  Sleep Study -  n/a CPAP - none  STOP now taking any Aspirin (unless otherwise instructed by your surgeon), Aleve, Naproxen, Ibuprofen, Motrin, Advil, Goody's, BC's, all herbal medications, fish oil, and all vitamins.   Coronavirus Screening Covid test n/a Ambulatory Surgery  Do you have any of the following symptoms:  Cough yes/no: No Fever (>100.40F)  yes/no: No Runny nose yes/no: No Sore throat yes/no: No Difficulty breathing/shortness of breath  yes/no: No  Have you traveled in the last 14 days and where? yes/no: No  Patient verbalized understanding of instructions that were given via phone.

## 2022-01-10 NOTE — Telephone Encounter (Signed)
Spoke with patient to follow up from [facility] 1/11 and assess navigation needs.  Discussed recent MRI with her and the need for bx of a mass in the left breast. I have placed orders and informed her we would get this scheduled as soon as possible.  Patient verbalized understanding.  No other questions at this time.  Encouraged her to call should anything arise. Patient verbalized understanding.

## 2022-01-11 ENCOUNTER — Encounter: Payer: Self-pay | Admitting: *Deleted

## 2022-01-11 ENCOUNTER — Other Ambulatory Visit: Payer: Self-pay

## 2022-01-11 ENCOUNTER — Encounter (HOSPITAL_COMMUNITY): Payer: Self-pay | Admitting: General Surgery

## 2022-01-11 NOTE — Progress Notes (Signed)
Pharmacist Chemotherapy Monitoring - Initial Assessment    Anticipated start date: 01/16/22   The following has been reviewed per standard work regarding the patient's treatment regimen: The patient's diagnosis, treatment plan and drug doses, and organ/hematologic function Lab orders and baseline tests specific to treatment regimen  The treatment plan start date, drug sequencing, and pre-medications Prior authorization status  Patient's documented medication list, including drug-drug interaction screen and prescriptions for anti-emetics and supportive care specific to the treatment regimen The drug concentrations, fluid compatibility, administration routes, and timing of the medications to be used The patient's access for treatment and lifetime cumulative dose history, if applicable  The patient's medication allergies and previous infusion related reactions, if applicable   Changes made to treatment plan:  N/A  Follow up needed:  Pending authorization for treatment     Kennith Center, Pharm.D., CPP 01/11/2022@4 :58 PM

## 2022-01-13 ENCOUNTER — Encounter (HOSPITAL_COMMUNITY): Payer: Self-pay | Admitting: General Surgery

## 2022-01-13 NOTE — Anesthesia Preprocedure Evaluation (Addendum)
Anesthesia Evaluation  Patient identified by MRN, date of birth, ID band Patient awake    Reviewed: Allergy & Precautions, NPO status , Patient's Chart, lab work & pertinent test results  Airway Mallampati: II  TM Distance: >3 FB Neck ROM: Full    Dental no notable dental hx. (+) Caps, Dental Advisory Given   Pulmonary neg pulmonary ROS,    Pulmonary exam normal breath sounds clear to auscultation       Cardiovascular negative cardio ROS Normal cardiovascular exam Rhythm:Regular Rate:Normal     Neuro/Psych negative neurological ROS  negative psych ROS   GI/Hepatic negative GI ROS, Neg liver ROS,   Endo/Other  Obesity Right breast Ca - undergoing ChemoRx  Renal/GU Renal InsufficiencyRenal disease  negative genitourinary   Musculoskeletal negative musculoskeletal ROS (+)   Abdominal (+) + obese,   Peds  Hematology  (+) anemia ,   Anesthesia Other Findings   Reproductive/Obstetrics                            Anesthesia Physical Anesthesia Plan  ASA: 2  Anesthesia Plan: General   Post-op Pain Management: Minimal or no pain anticipated   Induction: Intravenous  PONV Risk Score and Plan: 4 or greater and Treatment may vary due to age or medical condition, Midazolam, Scopolamine patch - Pre-op, Dexamethasone and Ondansetron  Airway Management Planned: LMA  Additional Equipment: None  Intra-op Plan:   Post-operative Plan: Extubation in OR  Informed Consent: I have reviewed the patients History and Physical, chart, labs and discussed the procedure including the risks, benefits and alternatives for the proposed anesthesia with the patient or authorized representative who has indicated his/her understanding and acceptance.     Dental advisory given  Plan Discussed with: Anesthesiologist and CRNA  Anesthesia Plan Comments:        Anesthesia Quick Evaluation

## 2022-01-14 ENCOUNTER — Other Ambulatory Visit: Payer: Self-pay

## 2022-01-14 ENCOUNTER — Ambulatory Visit (HOSPITAL_COMMUNITY): Payer: 59

## 2022-01-14 ENCOUNTER — Encounter (HOSPITAL_COMMUNITY)
Admission: RE | Disposition: A | Discharge status: Home / Self Care | Payer: Self-pay | Source: Home / Self Care | Attending: General Surgery

## 2022-01-14 ENCOUNTER — Encounter: Payer: Self-pay | Admitting: Hematology and Oncology

## 2022-01-14 ENCOUNTER — Ambulatory Visit (HOSPITAL_COMMUNITY): Payer: 59 | Admitting: Anesthesiology

## 2022-01-14 ENCOUNTER — Encounter: Payer: Self-pay | Admitting: *Deleted

## 2022-01-14 ENCOUNTER — Encounter (HOSPITAL_COMMUNITY): Payer: Self-pay | Admitting: General Surgery

## 2022-01-14 ENCOUNTER — Ambulatory Visit (HOSPITAL_COMMUNITY)
Admission: RE | Admit: 2022-01-14 | Discharge: 2022-01-14 | Disposition: A | Discharge status: Home / Self Care | Payer: 59 | Attending: General Surgery | Admitting: General Surgery

## 2022-01-14 DIAGNOSIS — Z452 Encounter for adjustment and management of vascular access device: Secondary | ICD-10-CM | POA: Insufficient documentation

## 2022-01-14 DIAGNOSIS — C50411 Malignant neoplasm of upper-outer quadrant of right female breast: Secondary | ICD-10-CM | POA: Insufficient documentation

## 2022-01-14 DIAGNOSIS — D649 Anemia, unspecified: Secondary | ICD-10-CM | POA: Diagnosis not present

## 2022-01-14 DIAGNOSIS — N289 Disorder of kidney and ureter, unspecified: Secondary | ICD-10-CM | POA: Insufficient documentation

## 2022-01-14 DIAGNOSIS — Z6835 Body mass index (BMI) 35.0-35.9, adult: Secondary | ICD-10-CM | POA: Diagnosis not present

## 2022-01-14 DIAGNOSIS — E669 Obesity, unspecified: Secondary | ICD-10-CM | POA: Insufficient documentation

## 2022-01-14 DIAGNOSIS — C50911 Malignant neoplasm of unspecified site of right female breast: Secondary | ICD-10-CM | POA: Diagnosis not present

## 2022-01-14 DIAGNOSIS — Z170 Estrogen receptor positive status [ER+]: Secondary | ICD-10-CM | POA: Insufficient documentation

## 2022-01-14 HISTORY — DX: Malignant (primary) neoplasm, unspecified: C80.1

## 2022-01-14 HISTORY — PX: PORTACATH PLACEMENT: SHX2246

## 2022-01-14 HISTORY — DX: Anemia, unspecified: D64.9

## 2022-01-14 LAB — POCT PREGNANCY, URINE: Preg Test, Ur: NEGATIVE

## 2022-01-14 IMAGING — RF DG C-ARM 1-60 MIN
1 series · 1 of 1 positions shown · IV contrast (agent unspecified)
Comparison: None.

CLINICAL DATA: Port-A-Cath placement.

EXAM:
DG C-ARM 1-60 MIN
CONTRAST:  None.
FLUOROSCOPY TIME:  Radiation Exposure Index (if provided by the
fluoroscopic device): 7.37 mGy.
Number of Acquired Spot Images: 1.

[Series 1: run · 1 of 1 slices shown]
[im 1/1]
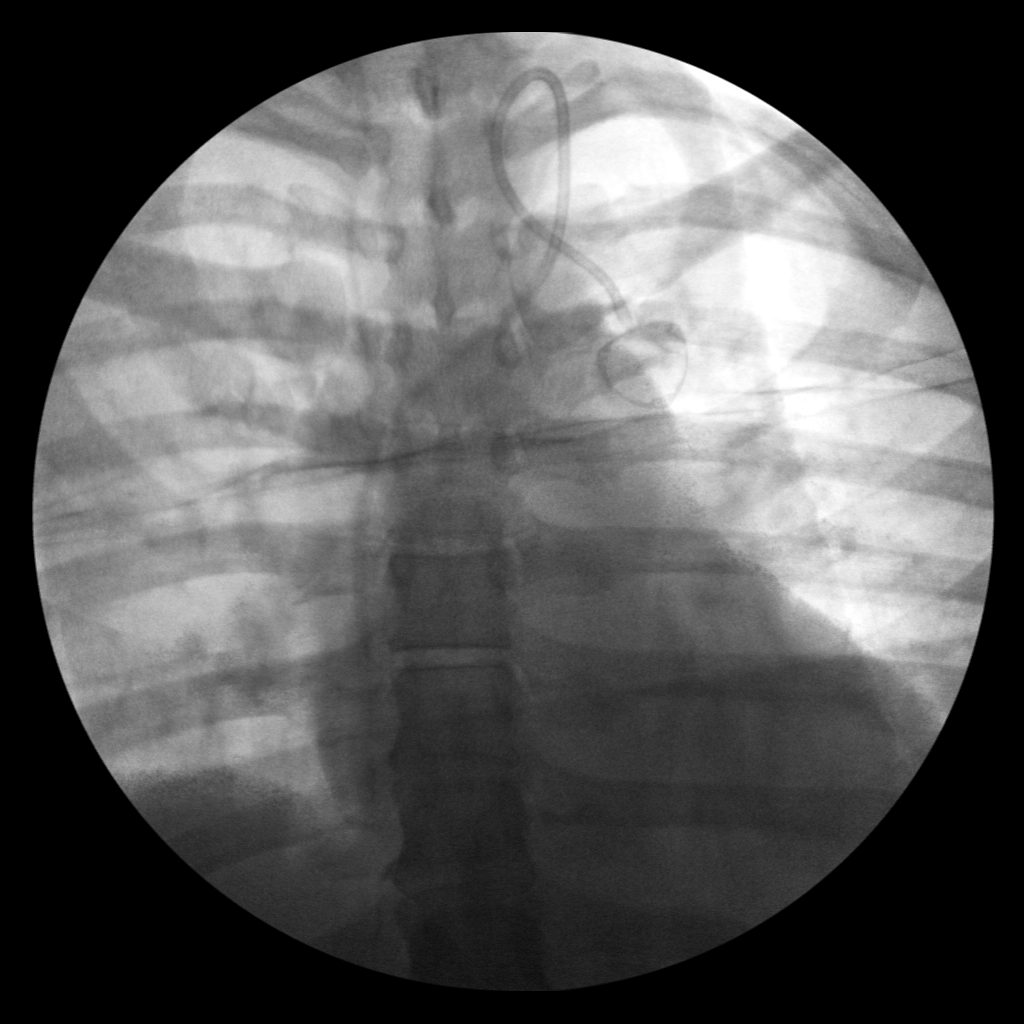

[1 of 1 positions shown; findings below may reference images not displayed]

FINDINGS: Single fluoroscopic image was obtained of the chest. Left internal
jugular Port-A-Cath is noted with distal tip in expected position of
right atrium.
IMPRESSION: Fluoroscopic guidance provided during placement of left-sided
Port-A-Cath.

## 2022-01-14 SURGERY — INSERTION, TUNNELED CENTRAL VENOUS DEVICE, WITH PORT
Anesthesia: General

## 2022-01-14 MED ORDER — SODIUM CHLORIDE 0.9 % IV SOLN: INTRAVENOUS | Status: DC

## 2022-01-14 MED ORDER — HEPARIN 6000 UNIT IRRIGATION SOLUTION
Status: AC
  Filled 2022-01-14: qty 500

## 2022-01-14 MED ORDER — HEPARIN SOD (PORK) LOCK FLUSH 100 UNIT/ML IV SOLN
INTRAVENOUS | Status: AC
  Filled 2022-01-14: qty 5

## 2022-01-14 MED ORDER — CHLORHEXIDINE GLUCONATE 0.12 % MT SOLN
OROMUCOSAL | Status: AC
  Administered 2022-01-14: 15 mL via OROMUCOSAL
  Filled 2022-01-14: qty 15

## 2022-01-14 MED ORDER — ACETAMINOPHEN 325 MG PO TABS: 650.0000 mg | ORAL_TABLET | ORAL | Status: DC | PRN

## 2022-01-14 MED ORDER — SODIUM CHLORIDE 0.9% FLUSH: 3.0000 mL | Freq: Two times a day (BID) | INTRAVENOUS | Status: DC

## 2022-01-14 MED ORDER — CHLORHEXIDINE GLUCONATE CLOTH 2 % EX PADS: 6.0000 | MEDICATED_PAD | Freq: Once | CUTANEOUS | Status: DC

## 2022-01-14 MED ORDER — MIDAZOLAM HCL 2 MG/2ML IJ SOLN
INTRAMUSCULAR | Status: AC
  Filled 2022-01-14: qty 2

## 2022-01-14 MED ORDER — PROPOFOL 10 MG/ML IV BOLUS
INTRAVENOUS | Status: AC
  Filled 2022-01-14: qty 20

## 2022-01-14 MED ORDER — CEFAZOLIN SODIUM-DEXTROSE 2-4 GM/100ML-% IV SOLN
INTRAVENOUS | Status: AC
  Filled 2022-01-14: qty 100

## 2022-01-14 MED ORDER — TRAMADOL HCL 50 MG PO TABS
100.0000 mg | ORAL_TABLET | Freq: Four times a day (QID) | ORAL | 0 refills | Status: DC | PRN
Start: 2022-01-14 — End: 2023-03-19

## 2022-01-14 MED ORDER — OXYCODONE HCL 5 MG/5ML PO SOLN: 5.0000 mg | Freq: Once | ORAL | Status: DC | PRN

## 2022-01-14 MED ORDER — LACTATED RINGERS IV SOLN: INTRAVENOUS | Status: DC

## 2022-01-14 MED ORDER — ORAL CARE MOUTH RINSE: 15.0000 mL | Freq: Once | OROMUCOSAL | Status: AC

## 2022-01-14 MED ORDER — ONDANSETRON HCL 4 MG/2ML IJ SOLN: 4.0000 mg | Freq: Once | INTRAMUSCULAR | Status: DC | PRN

## 2022-01-14 MED ORDER — MIDAZOLAM HCL 2 MG/2ML IJ SOLN
INTRAMUSCULAR | Status: DC | PRN
Start: 2022-01-14 — End: 2022-01-14
  Administered 2022-01-14: 2 mg via INTRAVENOUS

## 2022-01-14 MED ORDER — ACETAMINOPHEN 500 MG PO TABS
ORAL_TABLET | ORAL | Status: AC
  Administered 2022-01-14: 1000 mg via ORAL
  Filled 2022-01-14: qty 2

## 2022-01-14 MED ORDER — FENTANYL CITRATE (PF) 100 MCG/2ML IJ SOLN: 25.0000 ug | INTRAMUSCULAR | Status: DC | PRN

## 2022-01-14 MED ORDER — BUPIVACAINE HCL (PF) 0.25 % IJ SOLN
INTRAMUSCULAR | Status: AC
  Filled 2022-01-14: qty 30

## 2022-01-14 MED ORDER — SODIUM CHLORIDE 0.9% FLUSH: 3.0000 mL | INTRAVENOUS | Status: DC | PRN

## 2022-01-14 MED ORDER — HEPARIN SOD (PORK) LOCK FLUSH 100 UNIT/ML IV SOLN
INTRAVENOUS | Status: DC | PRN
  Administered 2022-01-14: 500 [IU] via INTRAVENOUS

## 2022-01-14 MED ORDER — SODIUM CHLORIDE 0.9 % IV SOLN: 250.0000 mL | INTRAVENOUS | Status: DC | PRN

## 2022-01-14 MED ORDER — PROPOFOL 10 MG/ML IV BOLUS
INTRAVENOUS | Status: DC | PRN
Start: 2022-01-14 — End: 2022-01-14
  Administered 2022-01-14: 50 mg via INTRAVENOUS
  Administered 2022-01-14: 150 mg via INTRAVENOUS

## 2022-01-14 MED ORDER — BUPIVACAINE HCL 0.25 % IJ SOLN
INTRAMUSCULAR | Status: DC | PRN
  Administered 2022-01-14: 5 mL

## 2022-01-14 MED ORDER — HEPARIN 6000 UNIT IRRIGATION SOLUTION
Status: DC | PRN
  Administered 2022-01-14: 1

## 2022-01-14 MED ORDER — ONDANSETRON HCL 4 MG/2ML IJ SOLN
INTRAMUSCULAR | Status: DC | PRN
  Administered 2022-01-14: 4 mg via INTRAVENOUS

## 2022-01-14 MED ORDER — CEFAZOLIN SODIUM-DEXTROSE 2-4 GM/100ML-% IV SOLN
2.0000 g | INTRAVENOUS | Status: AC
  Administered 2022-01-14: 2 g via INTRAVENOUS

## 2022-01-14 MED ORDER — OXYCODONE HCL 5 MG PO TABS: 5.0000 mg | ORAL_TABLET | ORAL | Status: DC | PRN

## 2022-01-14 MED ORDER — ENSURE PRE-SURGERY PO LIQD: 296.0000 mL | Freq: Once | ORAL | Status: DC

## 2022-01-14 MED ORDER — 0.9 % SODIUM CHLORIDE (POUR BTL) OPTIME
TOPICAL | Status: DC | PRN
  Administered 2022-01-14: 1000 mL

## 2022-01-14 MED ORDER — ACETAMINOPHEN 650 MG RE SUPP: 650.0000 mg | RECTAL | Status: DC | PRN

## 2022-01-14 MED ORDER — OXYCODONE HCL 5 MG PO TABS: 5.0000 mg | ORAL_TABLET | Freq: Once | ORAL | Status: DC | PRN

## 2022-01-14 MED ORDER — CHLORHEXIDINE GLUCONATE 0.12 % MT SOLN: 15.0000 mL | Freq: Once | OROMUCOSAL | Status: AC

## 2022-01-14 MED ORDER — FENTANYL CITRATE (PF) 250 MCG/5ML IJ SOLN
INTRAMUSCULAR | Status: DC | PRN
  Administered 2022-01-14 (×2): 25 ug via INTRAVENOUS

## 2022-01-14 MED ORDER — DEXAMETHASONE SODIUM PHOSPHATE 10 MG/ML IJ SOLN
INTRAMUSCULAR | Status: DC | PRN
Start: 2022-01-14 — End: 2022-01-14
  Administered 2022-01-14: 10 mg via INTRAVENOUS

## 2022-01-14 MED ORDER — ACETAMINOPHEN 500 MG PO TABS: 1000.0000 mg | ORAL_TABLET | ORAL | Status: AC

## 2022-01-14 MED ORDER — LIDOCAINE 2% (20 MG/ML) 5 ML SYRINGE
INTRAMUSCULAR | Status: DC | PRN
  Administered 2022-01-14: 80 mg via INTRAVENOUS

## 2022-01-14 MED ORDER — FENTANYL CITRATE (PF) 250 MCG/5ML IJ SOLN
INTRAMUSCULAR | Status: AC
  Filled 2022-01-14: qty 5

## 2022-01-14 MED ORDER — DEXAMETHASONE SODIUM PHOSPHATE 10 MG/ML IJ SOLN
INTRAMUSCULAR | Status: AC
  Filled 2022-01-14: qty 1

## 2022-01-14 SURGICAL SUPPLY — 45 items
ADH SKN CLS APL DERMABOND .7 (GAUZE/BANDAGES/DRESSINGS) ×1
BAG COUNTER SPONGE SURGICOUNT (BAG) ×2 IMPLANT
BAG DECANTER FOR FLEXI CONT (MISCELLANEOUS) ×2 IMPLANT
BAG SPNG CNTER NS LX DISP (BAG) ×1
CHLORAPREP W/TINT 10.5 ML (MISCELLANEOUS) ×2 IMPLANT
COVER PROBE W GEL 5X96 (DRAPES) ×1 IMPLANT
COVER SURGICAL LIGHT HANDLE (MISCELLANEOUS) ×2 IMPLANT
COVER TRANSDUCER ULTRASND GEL (DISPOSABLE) ×2 IMPLANT
DECANTER SPIKE VIAL GLASS SM (MISCELLANEOUS) ×2 IMPLANT
DERMABOND ADVANCED (GAUZE/BANDAGES/DRESSINGS) ×1
DERMABOND ADVANCED .7 DNX12 (GAUZE/BANDAGES/DRESSINGS) ×1 IMPLANT
DRAPE C-ARM 42X120 X-RAY (DRAPES) ×2 IMPLANT
DRAPE CHEST BREAST 15X10 FENES (DRAPES) ×2 IMPLANT
DRSG TEGADERM 4X4.75 (GAUZE/BANDAGES/DRESSINGS) ×1 IMPLANT
ELECT CAUTERY BLADE 6.4 (BLADE) ×2 IMPLANT
ELECT REM PT RETURN 9FT ADLT (ELECTROSURGICAL) ×2
ELECTRODE REM PT RTRN 9FT ADLT (ELECTROSURGICAL) ×1 IMPLANT
GAUZE 4X4 16PLY ~~LOC~~+RFID DBL (SPONGE) ×2 IMPLANT
GAUZE SPONGE 4X4 12PLY STRL (GAUZE/BANDAGES/DRESSINGS) ×1 IMPLANT
GEL ULTRASOUND 20GR AQUASONIC (MISCELLANEOUS) ×2 IMPLANT
GLOVE SURG ENC MOIS LTX SZ7 (GLOVE) ×2 IMPLANT
GLOVE SURG UNDER POLY LF SZ7.5 (GLOVE) ×2 IMPLANT
GOWN STRL REUS W/ TWL LRG LVL3 (GOWN DISPOSABLE) ×2 IMPLANT
GOWN STRL REUS W/TWL LRG LVL3 (GOWN DISPOSABLE) ×4
INTRODUCER COOK 11FR (CATHETERS) IMPLANT
KIT BASIN OR (CUSTOM PROCEDURE TRAY) ×2 IMPLANT
KIT PORT POWER 8FR ISP CVUE (Port) ×2 IMPLANT
KIT TURNOVER KIT B (KITS) ×2 IMPLANT
NS IRRIG 1000ML POUR BTL (IV SOLUTION) ×2 IMPLANT
PAD ARMBOARD 7.5X6 YLW CONV (MISCELLANEOUS) ×4 IMPLANT
PENCIL BUTTON HOLSTER BLD 10FT (ELECTRODE) ×2 IMPLANT
POSITIONER HEAD DONUT 9IN (MISCELLANEOUS) ×2 IMPLANT
SET INTRODUCER 12FR PACEMAKER (INTRODUCER) IMPLANT
SET SHEATH INTRODUCER 10FR (MISCELLANEOUS) IMPLANT
SHEATH COOK PEEL AWAY SET 9F (SHEATH) IMPLANT
SUT MNCRL AB 4-0 PS2 18 (SUTURE) ×2 IMPLANT
SUT PROLENE 2 0 SH DA (SUTURE) ×2 IMPLANT
SUT SILK 2 0 (SUTURE)
SUT SILK 2-0 18XBRD TIE 12 (SUTURE) IMPLANT
SUT VIC AB 3-0 SH 27 (SUTURE) ×2
SUT VIC AB 3-0 SH 27XBRD (SUTURE) ×1 IMPLANT
SYR 5ML LUER SLIP (SYRINGE) ×2 IMPLANT
TOWEL GREEN STERILE (TOWEL DISPOSABLE) ×2 IMPLANT
TOWEL GREEN STERILE FF (TOWEL DISPOSABLE) ×1 IMPLANT
TRAY LAPAROSCOPIC MC (CUSTOM PROCEDURE TRAY) ×2 IMPLANT

## 2022-01-14 NOTE — Anesthesia Postprocedure Evaluation (Signed)
Anesthesia Post Note  Patient: Tonya Blair  Procedure(s) Performed: INSERTION PORT-A-CATH     Patient location during evaluation: PACU Anesthesia Type: General Level of consciousness: awake and alert and oriented Pain management: pain level controlled Vital Signs Assessment: post-procedure vital signs reviewed and stable Respiratory status: spontaneous breathing, nonlabored ventilation and respiratory function stable Cardiovascular status: blood pressure returned to baseline and stable Postop Assessment: no apparent nausea or vomiting Anesthetic complications: no   No notable events documented.  Last Vitals:  Vitals:   01/14/22 0553 01/14/22 0830  BP: 123/76 127/77  Pulse: 92 92  Resp: 18 19  Temp: 36.8 C 36.6 C  SpO2: 98% 100%    Last Pain:  Vitals:   01/14/22 0845  TempSrc:   PainSc: 0-No pain                 Tyvion Edmondson A.

## 2022-01-14 NOTE — Interval H&P Note (Signed)
History and Physical Interval Note:  01/14/2022 6:48 AM  Tonya Blair  has presented today for surgery, with the diagnosis of BREAST CANCER.  The various methods of treatment have been discussed with the patient and family. After consideration of risks, benefits and other options for treatment, the patient has consented to  Procedure(s): INSERTION PORT-A-CATH (N/A) as a surgical intervention.  The patient's history has been reviewed, patient examined, no change in status, stable for surgery.  I have reviewed the patient's chart and labs.  Questions were answered to the patient's satisfaction.     Rolm Bookbinder

## 2022-01-14 NOTE — Progress Notes (Signed)
Received letter from [place].   Patient approved for [place] copay  01/10/22 - 01/09/23 up to $25,000 which reduces her copay to $0 after insurance pays.  Called [employer] to obtain debit card information to provide to [facility].  A copy of the approval letter will be mailed to patient for her records only.  A copy scanned into [employer] for [place] also for billing/claim submissions.   Patient has my card for any additional financial questions or concerns.

## 2022-01-14 NOTE — H&P (Signed)
46 year old female with no prior history and no family history who presents without any discharge or any mass. She underwent a screening mammogram that shows C density breast she has a 2.1 cm x 2.1 cm x 0.9 cm right breast mass with associated calcifications. Her axillary ultrasound is negative. 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 She is here to discuss her options.  Review of Systems: A complete review of systems was obtained from the patient. I have reviewed this information and discussed as appropriate with the patient. See HPI as well for other ROS.  Review of Systems  All other systems reviewed and are negative.  Medical History: History reviewed. No pertinent past medical history.  Patient Active Problem List  Diagnosis   Malignant neoplasm of upper-outer quadrant of right breast in female, estrogen receptor positive (CMS-HCC)   History reviewed. No pertinent surgical history.   Allergies  Allergen Reactions   Codeine Vomiting    Family History  Problem Relation Age of Onset   Multiple sclerosis Mother   Diabetes Father   Prostate cancer Father   Deep vein thrombosis (DVT or abnormal blood clot formation) Brother    Social History   Tobacco Use  Smoking Status Never  Smokeless Tobacco Not on file    Social History   Socioeconomic History   Marital status: Unknown  Tobacco Use   Smoking status: Never  Substance and Sexual Activity   Alcohol use: Yes   Drug use: Not Currently   Objective:   Physical Exam Constitutional:  Appearance: Normal appearance.  Cardiovascular:  Rate and Rhythm: Normal rate.  Pulmonary:  Effort: Pulmonary effort is normal.  Chest:  Breasts: Right: No inverted nipple, mass or nipple discharge.  Left: No inverted nipple, mass or nipple discharge.  Lymphadenopathy:  Upper Body:  Right upper body: No supraclavicular or axillary adenopathy.  Left upper body: No  supraclavicular or axillary adenopathy.  Neurological:  Mental Status: She is alert.     Assessment and Plan:   Malignant neoplasm of upper-outer quadrant of right breast in female, estrogen receptor positive (CMS-HCC)  Port placement, primary systemic therapy, MRI to follow lesion, genetic testing  We discussed the staging and pathophysiology of breast cancer. We discussed all of the different options for treatment for breast cancer including surgery, chemotherapy, radiation therapy, Herceptin, and antiestrogen therapy. Primary systemic therapy due to her 2 positivity would be standard with 2.1 cm cancer. We discussed rationale for this and surgery after chemo portion is done. No downside to this and may give prognostic information. Discussed port placement soon. We discussed a sentinel lymph node biopsy at the time of surgery.as she does not appear to having lymph node involvement right now. \ We discussed the options for treatment of the breast cancer which included lumpectomy versus a mastectomy. We discussed the performance of the lumpectomy with radioactive seed placement. We discussed a 5-10% chance of a positive margin requiring reexcision in the operating room. We also discussed that she will likely need radiation therapy if she undergoes lumpectomy. We discussed mastectomy and the postoperative care for that as well. Mastectomy can be followed by reconstruction. The decision for lumpectomy vs mastectomy has no impact on decision for chemotherapy. Most mastectomy patients will not need radiation therapy. We discussed that there is no difference in her survival whether she undergoes lumpectomy with radiation therapy or antiestrogen therapy versus a mastectomy. There is also no real difference between her  recurrence in the breast. We will make final surgical decision at completion of chemotherapy

## 2022-01-14 NOTE — Discharge Instructions (Signed)
PORT-A-CATH: POST OP INSTRUCTIONS  Always review your discharge instruction sheet given to you by the facility where your surgery was performed.   A prescription for pain medication may be given to you upon discharge. Take your pain medication as prescribed, if needed. If narcotic pain medicine is not needed, then you make take acetaminophen (Tylenol) or ibuprofen (Advil) as needed.  Take your usually prescribed medications unless otherwise directed. If you need a refill on your pain medication, please contact our office. All narcotic pain medicine now requires a paper prescription.  Phoned in and fax refills are no longer allowed by law.  Prescriptions will not be filled after 5 pm or on weekends.  You should follow a light diet for the remainder of the day after your procedure. Most patients will experience some mild swelling and/or bruising in the area of the incision. It may take several days to resolve. It is common to experience some constipation if taking pain medication after surgery. Increasing fluid intake and taking a stool softener (such as Colace) will usually help or prevent this problem from occurring. A mild laxative (Milk of Magnesia or Miralax) should be taken according to package directions if there are no bowel movements after 48 hours.  Unless discharge instructions indicate otherwise, you may remove your bandages 48 hours after surgery, and you may shower at that time. You may have steri-strips (small white skin tapes) in place directly over the incision.  These strips should be left on the skin for 7-10 days.  If your surgeon used Dermabond (skin glue) on the incision, you may shower in 24 hours.  The glue will flake off over the next 2-3 weeks.  If your port is left accessed at the end of surgery (needle left in port), the dressing cannot get wet and should only by changed by a healthcare professional. When the port is no longer accessed (when the needle has been removed), follow  step 7.   ACTIVITIES:  Limit activity involving your arms for the next 72 hours. Do no strenuous exercise or activity for 1 week. You may drive when you are no longer taking prescription pain medication, you can comfortably wear a seatbelt, and you can maneuver your car. 10.You may need to see your doctor in the office for a follow-up appointment.  Please       check with your doctor.  11.When you receive a new Port-a-Cath, you will get a product guide and        ID card.  Please keep them in case you need them.  WHEN TO CALL YOUR DOCTOR ([phone]): Fever over 101.0 Chills Continued bleeding from incision Increased redness and tenderness at the site Shortness of breath, difficulty breathing   The clinic staff is available to answer your questions during regular business hours. Please don't hesitate to call and ask to speak to one of the nurses or medical assistants for clinical concerns. If you have a medical emergency, go to the nearest emergency room or call 911.  A surgeon from [facility] is always on call at the hospital.     For further information, please visit [email or web address]      

## 2022-01-14 NOTE — Op Note (Signed)
Preoperative diagnosis: Clinical stage II right breast cancer, HER2 positive Postoperative diagnosis: Same as above Procedure: Left internal jugular port placement Surgeon: Dr. Serita Grammes Anesthesia: General Estimated blood loss: Minimal Specimens: None Drains: None Complications: None Special count was correct completion Disposition recovery stable edition  Indications: 47 year old female with no prior history and no family history who presents without any discharge or any mass. She underwent a screening mammogram that shows C density breast she has a 2.1 cm x 2.1 cm x 0.9 cm right breast mass with associated calcifications. Her axillary ultrasound is negative. 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 We discussed port placement.   Procedure: After informed consent was obtained she was taken to the operating.  She was given antibiotics.  SCDs were in place.  She was placed under general anesthesia without complication.  She was prepped and draped in a standard sterile surgical fashion.  Surgical timeout was then performed.  I then identified her left internal jugular vein with the ultrasound.  I made a small nick in the skin.  I then accessed this on the first pass with the needle.  The wire was placed.  This was confirmed to be in the vein by the ultrasound as well as by fluoroscopy.  I then placed local into the area below her clavicle where the pocket was made.  I made incision and developed a pocket.  I then tunneled the line between the 2 sites.  The dilator was then placed over the wire.  This was done using fluoroscopy.  The wire was then removed.  The line was placed through the sheath and the sheath removed.  I pulled the line back to be in the distal cava near the cavoatrial junction.  This was a good position upon completion.  I then hooked the port up to the line.  I sutured this position with 2-0 Prolene suture.  I then  accessed the port.  Aspirated blood and flushed easily.  I placed heparin in the port.  I then got a final completion film that showed this all to be in good position.  I accessed the port for chemotherapy on Wednesday and placed a dressing. I then closed with 3-0 Vicryl 4-0 Monocryl.  Glue was placed.  I accessed the port for chemotherapy on Wednesday and placed a dressing.She tolerated this well and was transferred to recovery.

## 2022-01-14 NOTE — Anesthesia Procedure Notes (Signed)
Procedure Name: LMA Insertion Date/Time: 01/14/2022 7:38 AM Performed by: Erick Colace, CRNA Pre-anesthesia Checklist: Suction available, Patient being monitored and Emergency Drugs available Patient Re-evaluated:Patient Re-evaluated prior to induction Oxygen Delivery Method: Circle system utilized Preoxygenation: Pre-oxygenation with 100% oxygen Induction Type: IV induction Ventilation: Mask ventilation without difficulty LMA: LMA inserted LMA Size: 4.0 Placement Confirmation: breath sounds checked- equal and bilateral and positive ETCO2 Tube secured with: Tape Dental Injury: Teeth and Oropharynx as per pre-operative assessment

## 2022-01-14 NOTE — Transfer of Care (Signed)
Immediate Anesthesia Transfer of Care Note  Patient: Tonya Blair  Procedure(s) Performed: INSERTION PORT-A-CATH  Patient Location: PACU  Anesthesia Type:General  Level of Consciousness: drowsy  Airway & Oxygen Therapy: Patient Spontanous Breathing and Patient connected to face mask oxygen  Post-op Assessment: Report given to RN and Post -op Vital signs reviewed and stable  Post vital signs: Reviewed and stable  Last Vitals:  Vitals Value Taken Time  BP 124/80   Temp    Pulse 88 01/14/22 0828  Resp 18 01/14/22 0828  SpO2 100 % 01/14/22 0828  Vitals shown include unvalidated device data.  Last Pain:  Vitals:   01/14/22 0102  TempSrc:   PainSc: 0-No pain         Complications: No notable events documented.

## 2022-01-15 ENCOUNTER — Encounter: Payer: Self-pay | Admitting: Hematology and Oncology

## 2022-01-15 ENCOUNTER — Inpatient Hospital Stay (HOSPITAL_BASED_OUTPATIENT_CLINIC_OR_DEPARTMENT_OTHER): Payer: 59 | Admitting: Hematology and Oncology

## 2022-01-15 ENCOUNTER — Inpatient Hospital Stay: Payer: 59

## 2022-01-15 DIAGNOSIS — R293 Abnormal posture: Secondary | ICD-10-CM | POA: Diagnosis not present

## 2022-01-15 DIAGNOSIS — C50411 Malignant neoplasm of upper-outer quadrant of right female breast: Secondary | ICD-10-CM

## 2022-01-15 DIAGNOSIS — Z170 Estrogen receptor positive status [ER+]: Secondary | ICD-10-CM

## 2022-01-15 LAB — CBC WITH DIFFERENTIAL/PLATELET
Abs Immature Granulocytes: 0.05 10*3/uL (ref 0.00–0.07)
Basophils Absolute: 0 10*3/uL (ref 0.0–0.1)
Basophils Relative: 0 %
Eosinophils Absolute: 0.1 10*3/uL (ref 0.0–0.5)
Eosinophils Relative: 0 %
HCT: 37.4 % (ref 36.0–46.0)
Hemoglobin: 11.9 g/dL — ABNORMAL LOW (ref 12.0–15.0)
Immature Granulocytes: 0 %
Lymphocytes Relative: 23 %
Lymphs Abs: 3.2 10*3/uL (ref 0.7–4.0)
MCH: 25.6 pg — ABNORMAL LOW (ref 26.0–34.0)
MCHC: 31.8 g/dL (ref 30.0–36.0)
MCV: 80.4 fL (ref 80.0–100.0)
Monocytes Absolute: 1 10*3/uL (ref 0.1–1.0)
Monocytes Relative: 7 %
Neutro Abs: 9.5 10*3/uL — ABNORMAL HIGH (ref 1.7–7.7)
Neutrophils Relative %: 70 %
Platelets: 292 10*3/uL (ref 150–400)
RBC: 4.65 MIL/uL (ref 3.87–5.11)
RDW: 15.2 % (ref 11.5–15.5)
WBC: 13.9 10*3/uL — ABNORMAL HIGH (ref 4.0–10.5)
nRBC: 0 % (ref 0.0–0.2)

## 2022-01-15 LAB — COMPREHENSIVE METABOLIC PANEL
ALT: 14 U/L (ref 0–44)
AST: 12 U/L — ABNORMAL LOW (ref 15–41)
Albumin: 4.2 g/dL (ref 3.5–5.0)
Alkaline Phosphatase: 69 U/L (ref 38–126)
Anion gap: 7 (ref 5–15)
BUN: 11 mg/dL (ref 6–20)
CO2: 26 mmol/L (ref 22–32)
Calcium: 9.4 mg/dL (ref 8.9–10.3)
Chloride: 104 mmol/L (ref 98–111)
Creatinine, Ser: 1 mg/dL (ref 0.44–1.00)
GFR, Estimated: >60 mL/min (ref >60)
Glucose, Bld: 147 mg/dL — ABNORMAL HIGH (ref 70–99)
Potassium: 3.9 mmol/L (ref 3.5–5.1)
Sodium: 137 mmol/L (ref 135–145)
Total Bilirubin: 0.3 mg/dL (ref 0.3–1.2)
Total Protein: 7.1 g/dL (ref 6.5–8.1)

## 2022-01-15 NOTE — Progress Notes (Signed)
Met with patient earlier today at registration to complete grant process.  Patient approved for one-time $1000 [employer] grant to assist with personal expenses while going through treatment.  Discussed in detail expenses and how they are covered. She received a gift card today.  She has a copy of approval letter and expense sheet along with the Outpatient pharmacy information along with my card in a green folder for any additional financial questions or concerns.

## 2022-01-15 NOTE — Assessment & Plan Note (Addendum)
This is a very pleasant 46 year old female patient with T2N0 invasive ductal carcinoma, grade 3, ER +70% moderate, PR +20% moderate and HER2 positive by IHC, tumor size measuring 2.1 cm in largest dimension and negative axilla on sonographic examination referred to [facility] for additional recommendations.  Given tumor size being larger than 2 cm and given presence of HER2 amplification we have discussed about considering neoadjuvant chemotherapy.  We discussed about TCHP as my choice of neoadjuvant chemotherapy monitoring.  I have discussed about mechanism of action of chemotherapy, adverse effects of chemotherapy including but not limited to fatigue, nausea, vomiting, diarrhea, neuropathy, increased risk of infections, cardiotoxicity, hair loss etc.  She understands that some of the side effects can be permanent and fatal.  If she has residual disease after surgery, she will be a candidate for HER 2 COMPASS study.  She understands that for surgery, depending on if she had complete response or not, we might continue Herceptin with Perjeta versus Kadcyla or very well could enroll her onto her to HER 2 COMPASS study. She is an excellent candidate for this trial.  She will also receive antiestrogen therapy after completion of local therapy.  She understands that HER2 directed therapy will be for a total duration of 1 year.    Baseline ECHO reviewed, no concerns CBC reviewed, no major concerns, CMP pending We will proceed with C1D1 tomorrow as planned Once again, reviewed side effects RTC in 3 weeks for cycle 2.

## 2022-01-15 NOTE — Progress Notes (Signed)
The following biosimilar Ziextenzo (pegfilgrastim-bmez) has been selected for use in this patient.  Kennith Center, Pharm.D., CPP 01/15/2022@10 :14 AM

## 2022-01-15 NOTE — Progress Notes (Signed)
[facility] NOTE  Patient Care Team: Pcp, No as PCP - General Mauro Kaufmann, RN as Oncology Nurse Navigator Rockwell Germany, RN as Oncology Nurse Navigator Rolm Bookbinder, MD as Consulting Physician (General Surgery) Benay Pike, MD as Consulting Physician (Hematology and Oncology) Eppie Gibson, MD as Attending Physician (Radiation Oncology)  CHIEF COMPLAINTS/PURPOSE OF CONSULTATION:  Newly diagnosed breast cancer  HISTORY OF PRESENTING ILLNESS:  Tonya Blair 46 y.o. female is here because of recent diagnosis of right breast invasive ductal carcinoma.  Chronology  Patient was recalled from screening mammogram for right breast mass.  She had right breast diagnostic mammogram on 12/11/2021 which showed suspicious mass in the 9:00 region of the right breast.  Ultrasound-guided core biopsy of mass at 9:00 region was recommended.  Sonographic evaluation of the right axilla does not show any enlarged adenopathy. Right breast needle core biopsy of the breast mass at 9:00, 5 cm from the nipple showed invasive ductal carcinoma grade 3, ductal carcinoma in situ and calcifications.  Prognostics from this mass showed ER 70% moderate, PR 20% moderate, HER2 positive by IHC, Ki-67 of 30%.  I reviewed her records extensively and collaborated the history with the patient.  SUMMARY OF ONCOLOGIC HISTORY: Oncology History  Malignant neoplasm of upper-outer quadrant of right breast in female, estrogen receptor positive ([place])  12/31/2021 Initial Diagnosis   Malignant neoplasm of upper-outer quadrant of right breast in female, estrogen receptor positive ([place])   01/02/2022 Cancer Staging   Staging form: Breast, AJCC 8th Edition - Clinical stage from 01/02/2022: Stage IB (cT2, cN0, cM0, G3, ER+, PR+, HER2+) - Signed by Benay Pike, MD on 01/02/2022 Histologic grading system: 3 grade system    01/16/2022 -  Chemotherapy   Patient is on Treatment Plan : BREAST  Docetaxel  + Carboplatin + Trastuzumab + Pertuzumab  (TCHP) q21d       We initially saw him in [facility] and recommended neoadjuvant TCHP given T 2 tumor which is Her 2 amplified. She is here before planned C1D1. Since last visit, no new health complaints.  She had her port placed.  Echocardiogram done.  She is okay to start chemotherapy tomorrow as planned.  10 point review of systems reviewed and negative.  MEDICAL HISTORY:  Past Medical History:  Diagnosis Date   Anemia    IDA   Cancer ([place])    right breast cancer   Family history of prostate cancer 01/02/2022    SURGICAL HISTORY: Past Surgical History:  Procedure Laterality Date   BREAST BIOPSY Right 12/25/2021   THERAPEUTIC ABORTION     WISDOM TOOTH EXTRACTION      SOCIAL HISTORY: Social History   Socioeconomic History   Marital status: Single    Spouse name: Not on file   Number of children: Not on file   Years of education: Not on file   Highest education level: Not on file  Occupational History   Not on file  Tobacco Use   Smoking status: Never   Smokeless tobacco: Never  Vaping Use   Vaping Use: Never used  Substance and Sexual Activity   Alcohol use: Yes    Comment: occasional wine   Drug use: Never   Sexual activity: Yes    Birth control/protection: I.U.D.    Comment: Mirena IUD  Other Topics Concern   Not on file  Social History Narrative   Not on file   Social Determinants of Health   Financial Resource Strain: Low Risk  Difficulty of Paying Living Expenses: Not hard at all  Food Insecurity: No Food Insecurity   Worried About [place] in the Last Year: Never true   Ran Out of Food in the Last Year: Never true  Transportation Needs: No Transportation Needs   Lack of Transportation (Medical): No   Lack of Transportation (Non-Medical): No  Physical Activity: Not on file  Stress: Not on file  Social Connections: Not on file  Intimate Partner Violence: Not on file    FAMILY  HISTORY: Family History  Problem Relation Age of Onset   Prostate cancer Father 3    ALLERGIES:  is allergic to codeine.  MEDICATIONS:  Current Outpatient Medications  Medication Sig Dispense Refill   Ascorbic Acid (VITAMIN C PO) Take 1 tablet by mouth in the morning.     BIOTIN PO Take 1 tablet by mouth in the morning.     Cholecalciferol (VITAMIN D3 PO) Take 1 tablet by mouth in the morning.     dexamethasone (DECADRON) 4 MG tablet Take 2 tablets (8 mg total) by mouth 2 (two) times daily. Start the day before Taxotere. Then take daily x 3 days after chemotherapy. 30 tablet 1   FERROUS SULFATE PO Take 1 tablet by mouth in the morning.     ibuprofen (ADVIL) 200 MG tablet Take 200 mg by mouth every 8 (eight) hours as needed (pain.).     levonorgestrel (MIRENA, 52 MG,) 20 MCG/DAY IUD Mirena 20 mcg/24 hours (8 yrs) 52 mg intrauterine device     lidocaine-prilocaine (EMLA) cream Apply to affected area once 30 g 3   LORazepam (ATIVAN) 0.5 MG tablet Take 1 tablet (0.5 mg total) by mouth every 6 (six) hours as needed (Nausea or vomiting). 30 tablet 0   Multiple Minerals-Vitamins (CAL MAG ZINC +D3 PO) Take 1 tablet by mouth in the morning.     ondansetron (ZOFRAN) 8 MG tablet Take 1 tablet (8 mg total) by mouth 2 (two) times daily as needed (Nausea or vomiting). Start on the third day after chemotherapy. 30 tablet 1   OVER THE COUNTER MEDICATION Take 1 capsule by mouth in the morning. Sea Moss Superfood Capsule     prochlorperazine (COMPAZINE) 10 MG tablet Take 1 tablet (10 mg total) by mouth every 6 (six) hours as needed (Nausea or vomiting). 30 tablet 1   traMADol (ULTRAM) 50 MG tablet Take 2 tablets (100 mg total) by mouth every 6 (six) hours as needed. 10 tablet 0   No current facility-administered medications for this visit.    PHYSICAL EXAMINATION: ECOG PERFORMANCE STATUS: 0 - Asymptomatic  Vitals:   01/15/22 0907  BP: (!) 118/49  Pulse: 84  Resp: 18  Temp: 99 F (37.2 C)  SpO2:  100%   Filed Weights   01/15/22 0907  Weight: 198 lb 9.6 oz (90.1 kg)    GENERAL:alert, no distress and comfortable SKIN: skin color, texture, turgor are normal, no rashes or significant lesions EYES: normal, conjunctiva are pink and non-injected, sclera clear OROPHARYNX:no exudate, no erythema and lips, buccal mucosa, and tongue normal  NECK: supple, thyroid normal size, non-tender, without nodularity LYMPH:  no palpable lymphadenopathy in the cervical, axillary or inguinal LUNGS: clear to auscultation and percussion with normal breathing effort HEART: regular rate & rhythm and no murmurs and no lower extremity edema ABDOMEN:abdomen soft, non-tender and normal bowel sounds Musculoskeletal:no cyanosis of digits and no clubbing  PSYCH: alert & oriented x 3 with fluent speech NEURO: no focal  motor/sensory deficits BREAST: Both breasts appear normal to inspection.  Palpable right breast mass at 9 o'clock position measuring just about 2 cm on physical exam, mobile on palpation.  No palpable regional lymph nodes.  No nipple changes.  LABORATORY DATA:  I have reviewed the data as listed Lab Results  Component Value Date   WBC 13.9 (H) 01/15/2022   HGB 11.9 (L) 01/15/2022   HCT 37.4 01/15/2022   MCV 80.4 01/15/2022   PLT 292 01/15/2022   Lab Results  Component Value Date   NA 137 01/15/2022   K 3.9 01/15/2022   CL 104 01/15/2022   CO2 26 01/15/2022    RADIOGRAPHIC STUDIES: I have personally reviewed the radiological reports and agreed with the findings in the report.  ASSESSMENT AND PLAN:  Malignant neoplasm of upper-outer quadrant of right breast in female, estrogen receptor positive ([place]) This is a very pleasant 46 year old female patient with T2N0 invasive ductal carcinoma, grade 3, ER +70% moderate, PR +20% moderate and HER2 positive by IHC, tumor size measuring 2.1 cm in largest dimension and negative axilla on sonographic examination referred to [facility] for additional  recommendations.  Given tumor size being larger than 2 cm and given presence of HER2 amplification we have discussed about considering neoadjuvant chemotherapy.  We discussed about TCHP as my choice of neoadjuvant chemotherapy monitoring.  I have discussed about mechanism of action of chemotherapy, adverse effects of chemotherapy including but not limited to fatigue, nausea, vomiting, diarrhea, neuropathy, increased risk of infections, cardiotoxicity, hair loss etc.  She understands that some of the side effects can be permanent and fatal.  If she has residual disease after surgery, she will be a candidate for HER 2 COMPASS study.  She understands that for surgery, depending on if she had complete response or not, we might continue Herceptin with Perjeta versus Kadcyla or very well could enroll her onto her to HER 2 COMPASS study. She is an excellent candidate for this trial.  She will also receive antiestrogen therapy after completion of local therapy.  She understands that HER2 directed therapy will be for a total duration of 1 year.    Baseline ECHO reviewed, no concerns CBC reviewed, no major concerns, CMP pending We will proceed with C1D1 tomorrow as planned Once again, reviewed side effects RTC in 3 weeks for cycle 2.  All questions were answered. The patient knows to call the clinic with any problems, questions or concerns.    Benay Pike, MD 01/15/22

## 2022-01-16 ENCOUNTER — Inpatient Hospital Stay: Payer: 59

## 2022-01-16 ENCOUNTER — Other Ambulatory Visit: Payer: Self-pay

## 2022-01-16 VITALS — BP 142/88 | HR 61 | Temp 97.8°F | Resp 16

## 2022-01-16 DIAGNOSIS — C50411 Malignant neoplasm of upper-outer quadrant of right female breast: Secondary | ICD-10-CM

## 2022-01-16 DIAGNOSIS — R293 Abnormal posture: Secondary | ICD-10-CM | POA: Diagnosis not present

## 2022-01-16 DIAGNOSIS — Z170 Estrogen receptor positive status [ER+]: Secondary | ICD-10-CM | POA: Diagnosis not present

## 2022-01-16 MED ORDER — DIPHENHYDRAMINE HCL 25 MG PO CAPS
50.0000 mg | ORAL_CAPSULE | Freq: Once | ORAL | Status: AC
  Administered 2022-01-16: 09:00:00 50 mg via ORAL
  Filled 2022-01-16: qty 2

## 2022-01-16 MED ORDER — SODIUM CHLORIDE 0.9 % IV SOLN
840.0000 mg | Freq: Once | INTRAVENOUS | Status: AC
  Administered 2022-01-16: 840 mg via INTRAVENOUS
  Filled 2022-01-16: qty 28

## 2022-01-16 MED ORDER — HEPARIN SOD (PORK) LOCK FLUSH 100 UNIT/ML IV SOLN
500.0000 [IU] | Freq: Once | INTRAVENOUS | Status: AC | PRN
  Administered 2022-01-16: 16:00:00 500 [IU]

## 2022-01-16 MED ORDER — SODIUM CHLORIDE 0.9 % IV SOLN
150.0000 mg | Freq: Once | INTRAVENOUS | Status: AC
  Administered 2022-01-16: 09:00:00 150 mg via INTRAVENOUS
  Filled 2022-01-16: qty 150

## 2022-01-16 MED ORDER — PALONOSETRON HCL INJECTION 0.25 MG/5ML
0.2500 mg | Freq: Once | INTRAVENOUS | Status: AC
  Administered 2022-01-16: 08:00:00 0.25 mg via INTRAVENOUS
  Filled 2022-01-16: qty 5

## 2022-01-16 MED ORDER — SODIUM CHLORIDE 0.9% FLUSH
10.0000 mL | INTRAVENOUS | Status: DC | PRN
  Administered 2022-01-16: 16:00:00 10 mL

## 2022-01-16 MED ORDER — TRASTUZUMAB-DKST CHEMO 150 MG IV SOLR
8.0000 mg/kg | Freq: Once | INTRAVENOUS | Status: AC
  Administered 2022-01-16: 09:00:00 714 mg via INTRAVENOUS
  Filled 2022-01-16: qty 34

## 2022-01-16 MED ORDER — SODIUM CHLORIDE 0.9 % IV SOLN: Freq: Once | INTRAVENOUS | Status: AC

## 2022-01-16 MED ORDER — SODIUM CHLORIDE 0.9 % IV SOLN
10.0000 mg | Freq: Once | INTRAVENOUS | Status: AC
  Administered 2022-01-16: 09:00:00 10 mg via INTRAVENOUS
  Filled 2022-01-16: qty 10

## 2022-01-16 MED ORDER — SODIUM CHLORIDE 0.9 % IV SOLN
700.0000 mg | Freq: Once | INTRAVENOUS | Status: AC
  Administered 2022-01-16: 15:00:00 700 mg via INTRAVENOUS
  Filled 2022-01-16: qty 70

## 2022-01-16 MED ORDER — SODIUM CHLORIDE 0.9 % IV SOLN
75.0000 mg/m2 | Freq: Once | INTRAVENOUS | Status: AC
  Administered 2022-01-16: 13:00:00 150 mg via INTRAVENOUS
  Filled 2022-01-16: qty 15

## 2022-01-16 MED ORDER — ACETAMINOPHEN 325 MG PO TABS
650.0000 mg | ORAL_TABLET | Freq: Once | ORAL | Status: AC
  Administered 2022-01-16: 09:00:00 650 mg via ORAL
  Filled 2022-01-16: qty 2

## 2022-01-16 NOTE — Patient Instructions (Addendum)
[facility]  Discharge Instructions: Thank you for choosing [facility] to provide your oncology and hematology care.   If you have a lab appointment with the [facility], please go directly to the [facility] and check in at the registration area.   Wear comfortable clothing and clothing appropriate for easy access to any Portacath or PICC line.   We strive to give you quality time with your provider. You may need to reschedule your appointment if you arrive late (15 or more minutes).  Arriving late affects you and other patients whose appointments are after yours.  Also, if you miss three or more appointments without notifying the office, you may be dismissed from the clinic at the providers discretion.      For prescription refill requests, have your pharmacy contact our office and allow 72 hours for refills to be completed.    Today you received the following chemotherapy and/or immunotherapy agents: Ogivri, Perjeta, Docetaxel, & Carboplatin   To help prevent nausea and vomiting after your treatment, we encourage you to take your nausea medication as directed.  BELOW ARE SYMPTOMS THAT SHOULD BE REPORTED IMMEDIATELY: *FEVER GREATER THAN 100.4 F (38 C) OR HIGHER *CHILLS OR SWEATING *NAUSEA AND VOMITING THAT IS NOT CONTROLLED WITH YOUR NAUSEA MEDICATION *UNUSUAL SHORTNESS OF BREATH *UNUSUAL BRUISING OR BLEEDING *URINARY PROBLEMS (pain or burning when urinating, or frequent urination) *BOWEL PROBLEMS (unusual diarrhea, constipation, pain near the anus) TENDERNESS IN MOUTH AND THROAT WITH OR WITHOUT PRESENCE OF ULCERS (sore throat, sores in mouth, or a toothache) UNUSUAL RASH, SWELLING OR PAIN  UNUSUAL VAGINAL DISCHARGE OR ITCHING   Items with * indicate a potential emergency and should be followed up as soon as possible or go to the Emergency Department if any problems should occur.  Please show the CHEMOTHERAPY ALERT CARD or  IMMUNOTHERAPY ALERT CARD at check-in to the Emergency Department and triage nurse.  Should you have questions after your visit or need to cancel or reschedule your appointment, please contact [facility]  Dept: [phone]  and follow the prompts.  Office hours are 8:00 a.m. to 4:30 p.m. Monday - Friday. Please note that voicemails left after 4:00 p.m. may not be returned until the following business day.  We are closed weekends and major holidays. You have access to a nurse at all times for urgent questions. Please call the main number to the clinic Dept: [phone] and follow the prompts.   For any non-urgent questions, you may also contact your provider using MyChart. We now offer e-Visits for anyone 42 and older to request care online for non-urgent symptoms. For details visit [email or web address].   Also download the MyChart app! Go to the app store, search "MyChart", open the app, select [facility], and log in with your MyChart username and password.  Due to Covid, a mask is required upon entering the hospital/clinic. If you do not have a mask, one will be given to you upon arrival. For doctor visits, patients may have 1 support person aged 39 or older with them. For treatment visits, patients cannot have anyone with them due to current Covid guidelines and our immunocompromised population.   Trastuzumab injection for infusion What is this medication? TRASTUZUMAB (tras TOO zoo mab) is a monoclonal antibody. It is used to treat breast cancer and stomach cancer. This medicine may be used for other purposes; ask your health care provider or pharmacist if you have questions. COMMON BRAND  NAME(S): Herceptin, Janae Bridgeman, Ontruzant, Trazimera What should I tell my care team before I take this medication? They need to know if you have any of these conditions: heart disease heart failure lung or breathing disease, like asthma an unusual or  allergic reaction to trastuzumab, benzyl alcohol, or other medications, foods, dyes, or preservatives pregnant or trying to get pregnant breast-feeding How should I use this medication? This drug is given as an infusion into a vein. It is administered in a hospital or clinic by a specially trained health care professional. Talk to your pediatrician regarding the use of this medicine in children. This medicine is not approved for use in children. Overdosage: If you think you have taken too much of this medicine contact a poison control center or emergency room at once. NOTE: This medicine is only for you. Do not share this medicine with others. What if I miss a dose? It is important not to miss a dose. Call your doctor or health care professional if you are unable to keep an appointment. What may interact with this medication? This medicine may interact with the following medications: certain types of chemotherapy, such as daunorubicin, doxorubicin, epirubicin, and idarubicin This list may not describe all possible interactions. Give your health care provider a list of all the medicines, herbs, non-prescription drugs, or dietary supplements you use. Also tell them if you smoke, drink alcohol, or use illegal drugs. Some items may interact with your medicine. What should I watch for while using this medication? Visit your doctor for checks on your progress. Report any side effects. Continue your course of treatment even though you feel ill unless your doctor tells you to stop. Call your doctor or health care professional for advice if you get a fever, chills or sore throat, or other symptoms of a cold or flu. Do not treat yourself. Try to avoid being around people who are sick. You may experience fever, chills and shaking during your first infusion. These effects are usually mild and can be treated with other medicines. Report any side effects during the infusion to your health care professional. Fever  and chills usually do not happen with later infusions. Do not become pregnant while taking this medicine or for 7 months after stopping it. Women should inform their doctor if they wish to become pregnant or think they might be pregnant. Women of child-bearing potential will need to have a negative pregnancy test before starting this medicine. There is a potential for serious side effects to an unborn child. Talk to your health care professional or pharmacist for more information. Do not breast-feed an infant while taking this medicine or for 7 months after stopping it. Women must use effective birth control with this medicine. What side effects may I notice from receiving this medication? Side effects that you should report to your doctor or health care professional as soon as possible: allergic reactions like skin rash, itching or hives, swelling of the face, lips, or tongue chest pain or palpitations cough dizziness feeling faint or lightheaded, falls fever general ill feeling or flu-like symptoms signs of worsening heart failure like breathing problems; swelling in your legs and feet unusually weak or tired Side effects that usually do not require medical attention (report to your doctor or health care professional if they continue or are bothersome): bone pain changes in taste diarrhea joint pain nausea/vomiting weight loss This list may not describe all possible side effects. Call your doctor for medical advice about side  effects. You may report side effects to FDA at 1-800-FDA-1088. Where should I keep my medication? This drug is given in a hospital or clinic and will not be stored at home. NOTE: This sheet is a summary. It may not cover all possible information. If you have questions about this medicine, talk to your doctor, pharmacist, or health care provider.  2022 Elsevier/Gold Standard (2016-12-24 00:00:00)  Pertuzumab injection What is this medication? PERTUZUMAB (per TOOZ  ue mab) is a monoclonal antibody. It is used to treat breast cancer. This medicine may be used for other purposes; ask your health care provider or pharmacist if you have questions. COMMON BRAND NAME(S): PERJETA What should I tell my care team before I take this medication? They need to know if you have any of these conditions: heart disease heart failure high blood pressure history of irregular heart beat recent or ongoing radiation therapy an unusual or allergic reaction to pertuzumab, other medicines, foods, dyes, or preservatives pregnant or trying to get pregnant breast-feeding How should I use this medication? This medicine is for infusion into a vein. It is given by a health care professional in a hospital or clinic setting. Talk to your pediatrician regarding the use of this medicine in children. Special care may be needed. Overdosage: If you think you have taken too much of this medicine contact a poison control center or emergency room at once. NOTE: This medicine is only for you. Do not share this medicine with others. What if I miss a dose? It is important not to miss your dose. Call your doctor or health care professional if you are unable to keep an appointment. What may interact with this medication? Interactions are not expected. Give your health care provider a list of all the medicines, herbs, non-prescription drugs, or dietary supplements you use. Also tell them if you smoke, drink alcohol, or use illegal drugs. Some items may interact with your medicine. This list may not describe all possible interactions. Give your health care provider a list of all the medicines, herbs, non-prescription drugs, or dietary supplements you use. Also tell them if you smoke, drink alcohol, or use illegal drugs. Some items may interact with your medicine. What should I watch for while using this medication? Your condition will be monitored carefully while you are receiving this medicine.  Report any side effects. Continue your course of treatment even though you feel ill unless your doctor tells you to stop. Do not become pregnant while taking this medicine or for 7 months after stopping it. Women should inform their doctor if they wish to become pregnant or think they might be pregnant. Women of child-bearing potential will need to have a negative pregnancy test before starting this medicine. There is a potential for serious side effects to an unborn child. Talk to your health care professional or pharmacist for more information. Do not breast-feed an infant while taking this medicine or for 7 months after stopping it. Women must use effective birth control with this medicine. Call your doctor or health care professional for advice if you get a fever, chills or sore throat, or other symptoms of a cold or flu. Do not treat yourself. Try to avoid being around people who are sick. You may experience fever, chills, and headache during the infusion. Report any side effects during the infusion to your health care professional. What side effects may I notice from receiving this medication? Side effects that you should report to your doctor or health care  professional as soon as possible: breathing problems chest pain or palpitations dizziness feeling faint or lightheaded fever or chills skin rash, itching or hives sore throat swelling of the face, lips, or tongue swelling of the legs or ankles unusually weak or tired Side effects that usually do not require medical attention (report to your doctor or health care professional if they continue or are bothersome): diarrhea hair loss nausea, vomiting tiredness This list may not describe all possible side effects. Call your doctor for medical advice about side effects. You may report side effects to FDA at 1-800-FDA-1088. Where should I keep my medication? This drug is given in a hospital or clinic and will not be stored at home. NOTE:  This sheet is a summary. It may not cover all possible information. If you have questions about this medicine, talk to your doctor, pharmacist, or health care provider.  2022 Elsevier/Gold Standard (2016-01-11 00:00:00)  Docetaxel injection What is this medication? DOCETAXEL (doe se TAX el) is a chemotherapy drug. It targets fast dividing cells, like cancer cells, and causes these cells to die. This medicine is used to treat many types of cancers like breast cancer, certain stomach cancers, head and neck cancer, lung cancer, and prostate cancer. This medicine may be used for other purposes; ask your health care provider or pharmacist if you have questions. COMMON BRAND NAME(S): Docefrez, Taxotere What should I tell my care team before I take this medication? They need to know if you have any of these conditions: infection (especially a virus infection such as chickenpox, cold sores, or herpes) liver disease low blood counts, like low white cell, platelet, or red cell counts an unusual or allergic reaction to docetaxel, polysorbate 80, other chemotherapy agents, other medicines, foods, dyes, or preservatives pregnant or trying to get pregnant breast-feeding How should I use this medication? This drug is given as an infusion into a vein. It is administered in a hospital or clinic by a specially trained health care professional. Talk to your pediatrician regarding the use of this medicine in children. Special care may be needed. Overdosage: If you think you have taken too much of this medicine contact a poison control center or emergency room at once. NOTE: This medicine is only for you. Do not share this medicine with others. What if I miss a dose? It is important not to miss your dose. Call your doctor or health care professional if you are unable to keep an appointment. What may interact with this medication? Do not take this medicine with any of the following medications: live virus  vaccines This medicine may also interact with the following medications: aprepitant certain antibiotics like erythromycin or clarithromycin certain antivirals for HIV or hepatitis certain medicines for fungal infections like fluconazole, itraconazole, ketoconazole, posaconazole, or voriconazole cimetidine ciprofloxacin conivaptan cyclosporine dronedarone fluvoxamine grapefruit juice imatinib verapamil This list may not describe all possible interactions. Give your health care provider a list of all the medicines, herbs, non-prescription drugs, or dietary supplements you use. Also tell them if you smoke, drink alcohol, or use illegal drugs. Some items may interact with your medicine. What should I watch for while using this medication? Your condition will be monitored carefully while you are receiving this medicine. You will need important blood work done while you are taking this medicine. Call your doctor or health care professional for advice if you get a fever, chills or sore throat, or other symptoms of a cold or flu. Do not treat yourself. This drug  decreases your body's ability to fight infections. Try to avoid being around people who are sick. Some products may contain alcohol. Ask your health care professional if this medicine contains alcohol. Be sure to tell all health care professionals you are taking this medicine. Certain medicines, like metronidazole and disulfiram, can cause an unpleasant reaction when taken with alcohol. The reaction includes flushing, headache, nausea, vomiting, sweating, and increased thirst. The reaction can last from 30 minutes to several hours. You may get drowsy or dizzy. Do not drive, use machinery, or do anything that needs mental alertness until you know how this medicine affects you. Do not stand or sit up quickly, especially if you are an older patient. This reduces the risk of dizzy or fainting spells. Alcohol may interfere with the effect of this  medicine. Talk to your health care professional about your risk of cancer. You may be more at risk for certain types of cancer if you take this medicine. Do not become pregnant while taking this medicine or for 6 months after stopping it. Women should inform their doctor if they wish to become pregnant or think they might be pregnant. There is a potential for serious side effects to an unborn child. Talk to your health care professional or pharmacist for more information. Do not breast-feed an infant while taking this medicine or for 1 week after stopping it. Males who get this medicine must use a condom during sex with females who can get pregnant. If you get a woman pregnant, the baby could have birth defects. The baby could die before they are born. You will need to continue wearing a condom for 3 months after stopping the medicine. Tell your health care provider right away if your partner becomes pregnant while you are taking this medicine. This may interfere with the ability to father a child. You should talk to your doctor or health care professional if you are concerned about your fertility. What side effects may I notice from receiving this medication? Side effects that you should report to your doctor or health care professional as soon as possible: allergic reactions like skin rash, itching or hives, swelling of the face, lips, or tongue blurred vision breathing problems changes in vision low blood counts - This drug may decrease the number of white blood cells, red blood cells and platelets. You may be at increased risk for infections and bleeding. nausea and vomiting pain, redness or irritation at site where injected pain, tingling, numbness in the hands or feet redness, blistering, peeling, or loosening of the skin, including inside the mouth signs of decreased platelets or bleeding - bruising, pinpoint red spots on the skin, black, tarry stools, nosebleeds signs of decreased red blood  cells - unusually weak or tired, fainting spells, lightheadedness signs of infection - fever or chills, cough, sore throat, pain or difficulty passing urine swelling of the ankle, feet, hands Side effects that usually do not require medical attention (report to your doctor or health care professional if they continue or are bothersome): constipation diarrhea fingernail or toenail changes hair loss loss of appetite mouth sores muscle pain This list may not describe all possible side effects. Call your doctor for medical advice about side effects. You may report side effects to FDA at 1-800-FDA-1088. Where should I keep my medication? This drug is given in a hospital or clinic and will not be stored at home. NOTE: This sheet is a summary. It may not cover all possible information. If you have  questions about this medicine, talk to your doctor, pharmacist, or health care provider.  2022 Elsevier/Gold Standard (2021-08-28 00:00:00)  Carboplatin injection What is this medication? CARBOPLATIN (KAR boe pla tin) is a chemotherapy drug. It targets fast dividing cells, like cancer cells, and causes these cells to die. This medicine is used to treat ovarian cancer and many other cancers. This medicine may be used for other purposes; ask your health care provider or pharmacist if you have questions. COMMON BRAND NAME(S): Paraplatin What should I tell my care team before I take this medication? They need to know if you have any of these conditions: blood disorders hearing problems kidney disease recent or ongoing radiation therapy an unusual or allergic reaction to carboplatin, cisplatin, other chemotherapy, other medicines, foods, dyes, or preservatives pregnant or trying to get pregnant breast-feeding How should I use this medication? This drug is usually given as an infusion into a vein. It is administered in a hospital or clinic by a specially trained health care professional. Talk to your  pediatrician regarding the use of this medicine in children. Special care may be needed. Overdosage: If you think you have taken too much of this medicine contact a poison control center or emergency room at once. NOTE: This medicine is only for you. Do not share this medicine with others. What if I miss a dose? It is important not to miss a dose. Call your doctor or health care professional if you are unable to keep an appointment. What may interact with this medication? medicines for seizures medicines to increase blood counts like filgrastim, pegfilgrastim, sargramostim some antibiotics like amikacin, gentamicin, neomycin, streptomycin, tobramycin vaccines Talk to your doctor or health care professional before taking any of these medicines: acetaminophen aspirin ibuprofen ketoprofen naproxen This list may not describe all possible interactions. Give your health care provider a list of all the medicines, herbs, non-prescription drugs, or dietary supplements you use. Also tell them if you smoke, drink alcohol, or use illegal drugs. Some items may interact with your medicine. What should I watch for while using this medication? Your condition will be monitored carefully while you are receiving this medicine. You will need important blood work done while you are taking this medicine. This drug may make you feel generally unwell. This is not uncommon, as chemotherapy can affect healthy cells as well as cancer cells. Report any side effects. Continue your course of treatment even though you feel ill unless your doctor tells you to stop. In some cases, you may be given additional medicines to help with side effects. Follow all directions for their use. Call your doctor or health care professional for advice if you get a fever, chills or sore throat, or other symptoms of a cold or flu. Do not treat yourself. This drug decreases your body's ability to fight infections. Try to avoid being around people  who are sick. This medicine may increase your risk to bruise or bleed. Call your doctor or health care professional if you notice any unusual bleeding. Be careful brushing and flossing your teeth or using a toothpick because you may get an infection or bleed more easily. If you have any dental work done, tell your dentist you are receiving this medicine. Avoid taking products that contain aspirin, acetaminophen, ibuprofen, naproxen, or ketoprofen unless instructed by your doctor. These medicines may hide a fever. Do not become pregnant while taking this medicine. Women should inform their doctor if they wish to become pregnant or think they might be  pregnant. There is a potential for serious side effects to an unborn child. Talk to your health care professional or pharmacist for more information. Do not breast-feed an infant while taking this medicine. What side effects may I notice from receiving this medication? Side effects that you should report to your doctor or health care professional as soon as possible: allergic reactions like skin rash, itching or hives, swelling of the face, lips, or tongue signs of infection - fever or chills, cough, sore throat, pain or difficulty passing urine signs of decreased platelets or bleeding - bruising, pinpoint red spots on the skin, black, tarry stools, nosebleeds signs of decreased red blood cells - unusually weak or tired, fainting spells, lightheadedness breathing problems changes in hearing changes in vision chest pain high blood pressure low blood counts - This drug may decrease the number of white blood cells, red blood cells and platelets. You may be at increased risk for infections and bleeding. nausea and vomiting pain, swelling, redness or irritation at the injection site pain, tingling, numbness in the hands or feet problems with balance, talking, walking trouble passing urine or change in the amount of urine Side effects that usually do not  require medical attention (report to your doctor or health care professional if they continue or are bothersome): hair loss loss of appetite metallic taste in the mouth or changes in taste This list may not describe all possible side effects. Call your doctor for medical advice about side effects. You may report side effects to FDA at 1-800-FDA-1088. Where should I keep my medication? This drug is given in a hospital or clinic and will not be stored at home. NOTE: This sheet is a summary. It may not cover all possible information. If you have questions about this medicine, talk to your doctor, pharmacist, or health care provider.  2022 Elsevier/Gold Standard (2008-05-18 00:00:00)

## 2022-01-17 ENCOUNTER — Ambulatory Visit
Admission: RE | Admit: 2022-01-17 | Discharge: 2022-01-17 | Disposition: A | Discharge status: Home / Self Care | Payer: 59 | Source: Ambulatory Visit | Attending: Hematology and Oncology | Admitting: Hematology and Oncology

## 2022-01-17 ENCOUNTER — Other Ambulatory Visit (HOSPITAL_COMMUNITY): Payer: Self-pay | Admitting: Diagnostic Radiology

## 2022-01-17 DIAGNOSIS — N6323 Unspecified lump in the left breast, lower outer quadrant: Secondary | ICD-10-CM | POA: Diagnosis not present

## 2022-01-17 DIAGNOSIS — R928 Other abnormal and inconclusive findings on diagnostic imaging of breast: Secondary | ICD-10-CM

## 2022-01-17 DIAGNOSIS — Z170 Estrogen receptor positive status [ER+]: Secondary | ICD-10-CM

## 2022-01-17 DIAGNOSIS — C50411 Malignant neoplasm of upper-outer quadrant of right female breast: Secondary | ICD-10-CM

## 2022-01-17 DIAGNOSIS — D242 Benign neoplasm of left breast: Secondary | ICD-10-CM | POA: Diagnosis not present

## 2022-01-17 IMAGING — MG MM BREAST LOCALIZATION CLIP
4 series · 4 of 12 positions shown · non-contrast
Comparison: Previous exam(s).

CLINICAL DATA: Patient is post MRI guided biopsy of an
indeterminate 6 mm enhancing mass over the left outer lower
quadrant.

EXAM:
3D DIAGNOSTIC LEFT MAMMOGRAM POST MRI BIOPSY

[L ML synth-2D]
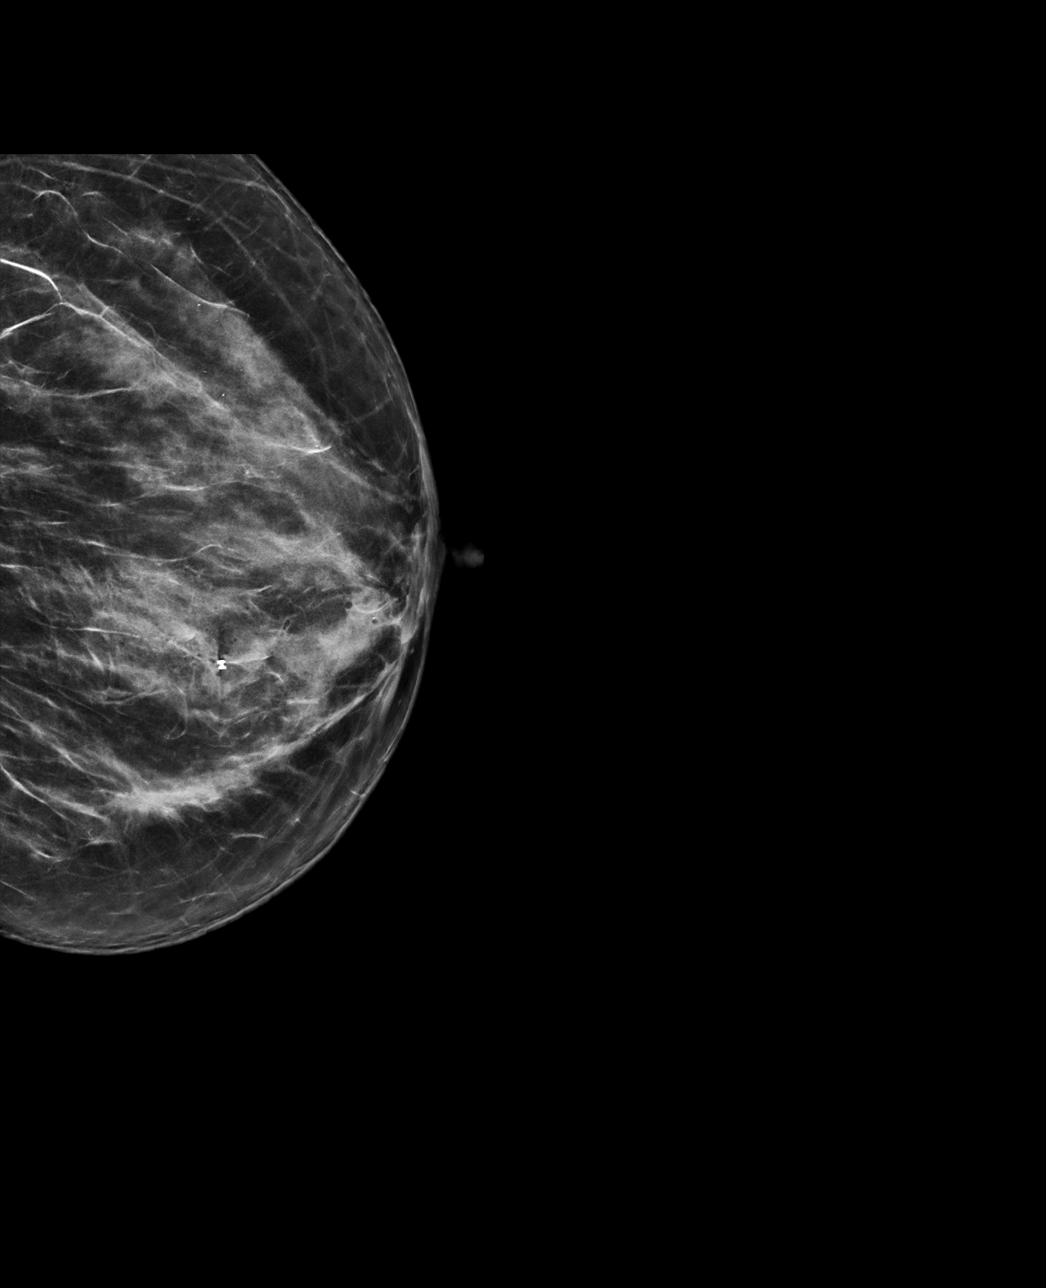

[L CC synth-2D]
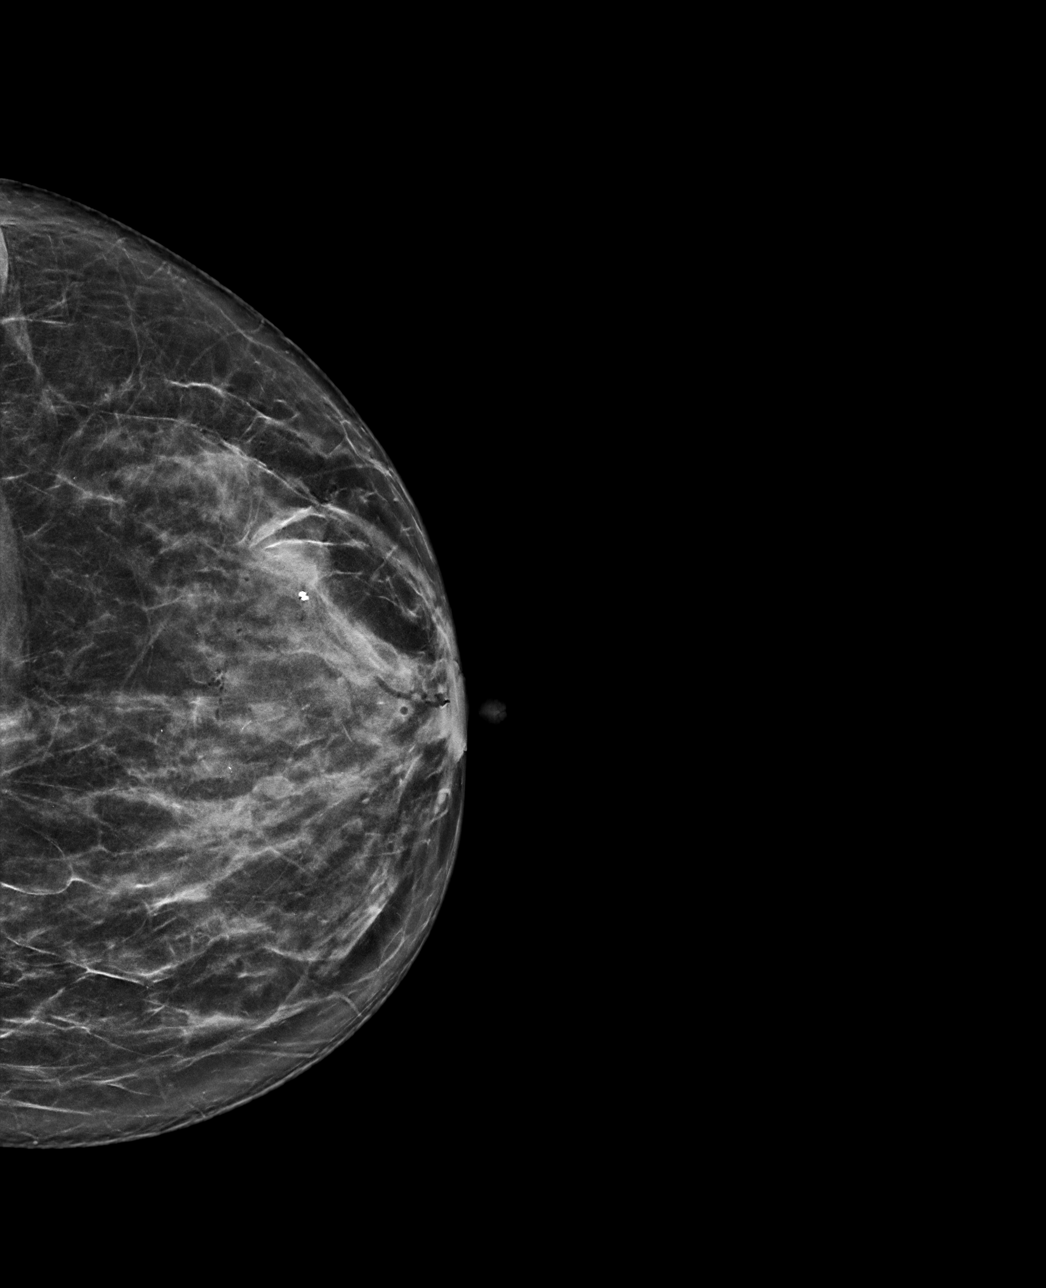

[L CC tomo · tomo slice 37/72.0]
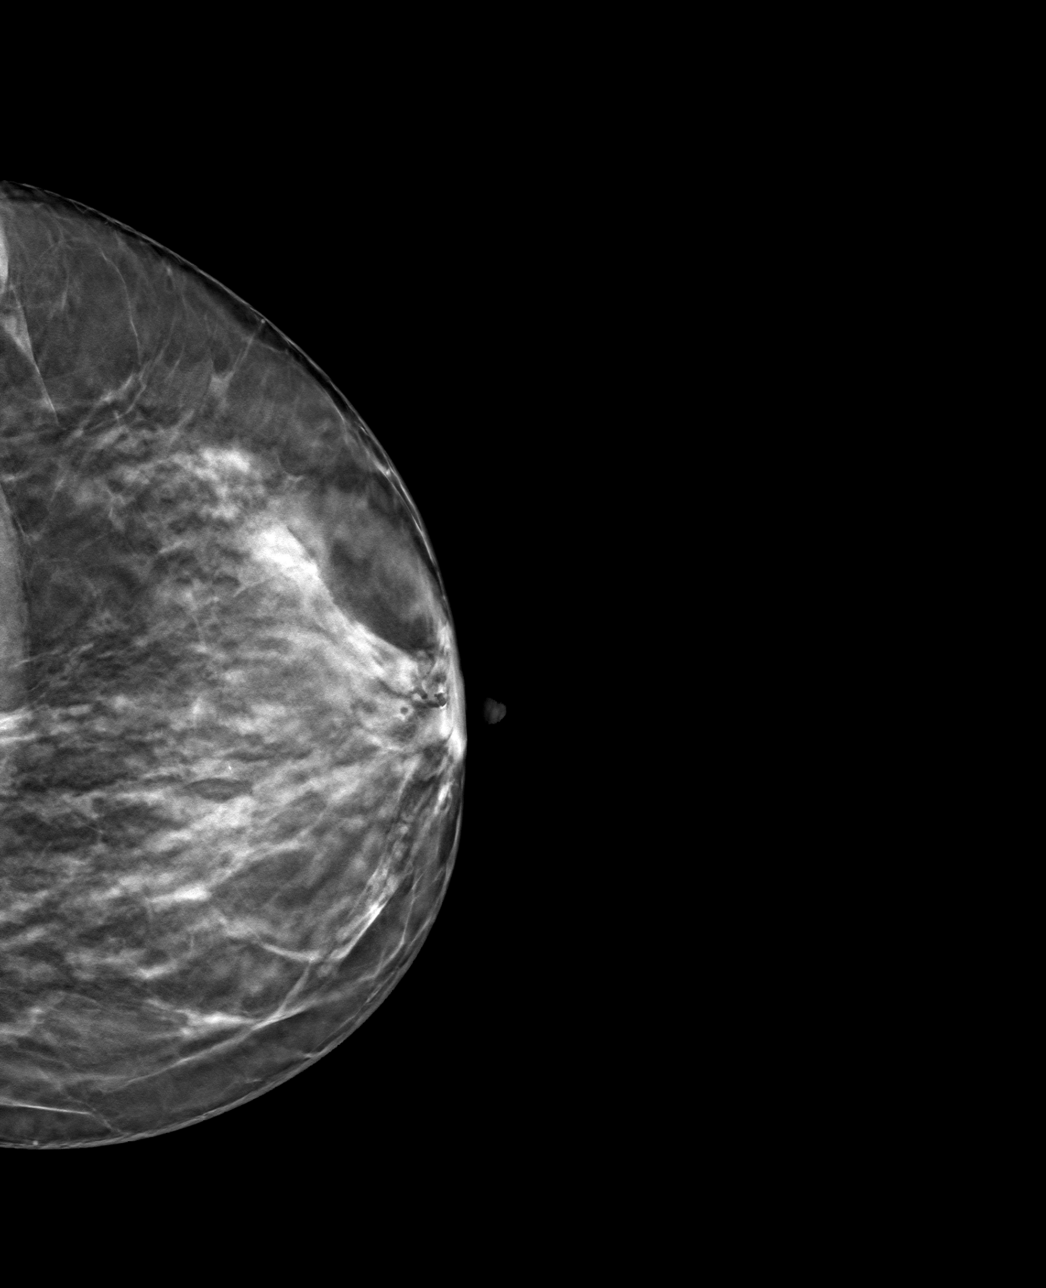

[L ML tomo · tomo slice 37/73.0]
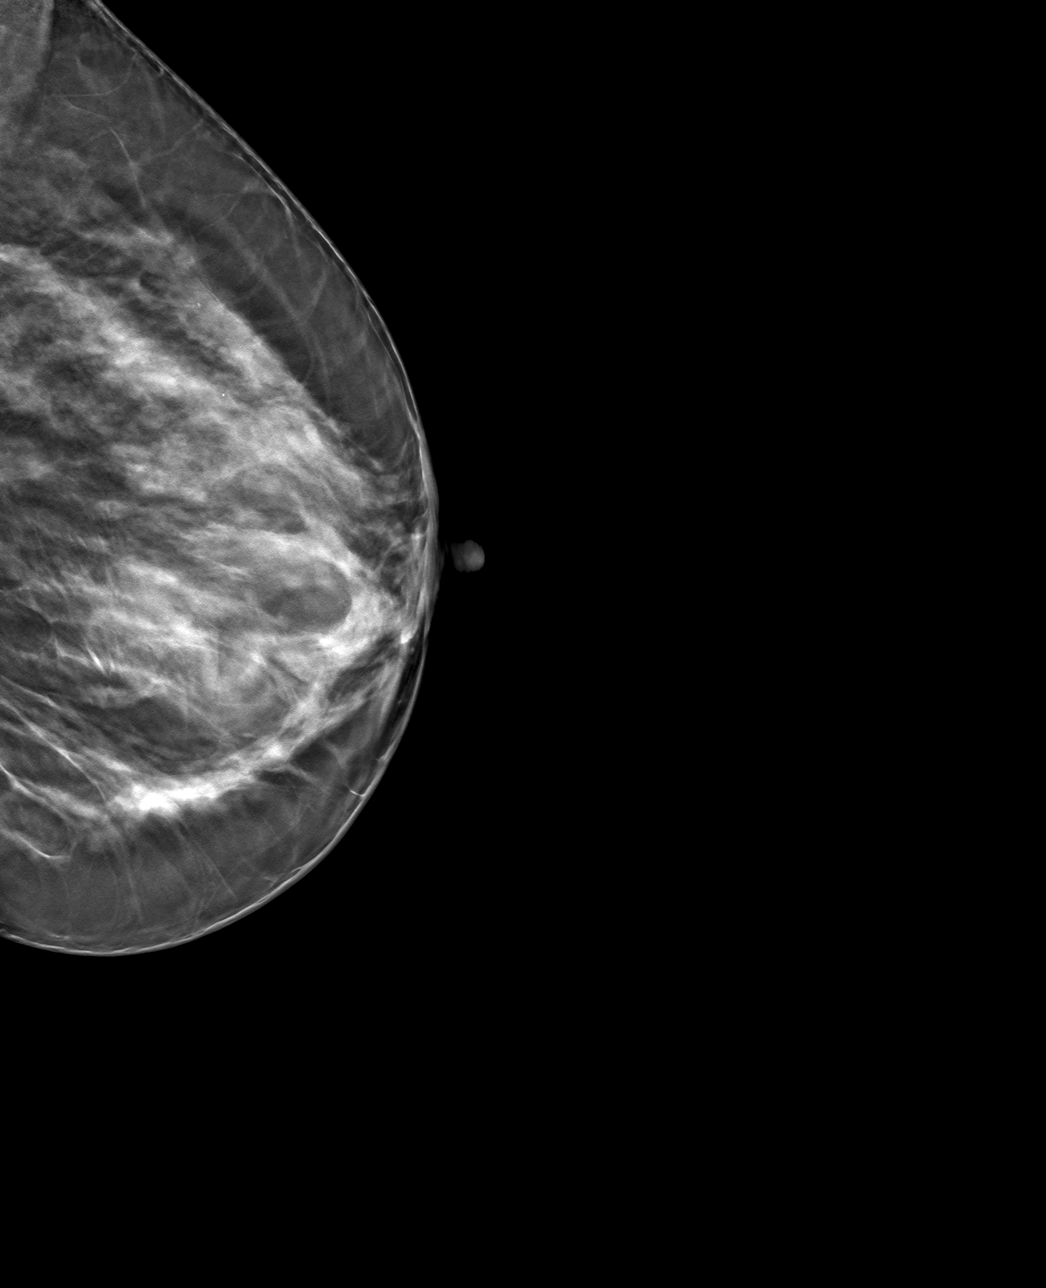

[4 of 12 positions shown; findings below may reference images not displayed]

FINDINGS: 3D Mammographic images were obtained following MRI guided biopsy of
the targeted mass over the outer lower left breast. The biopsy
marking clip is in expected position at the site of biopsy.
IMPRESSION: Appropriate positioning of the barbell shaped biopsy marking clip at
the site of biopsy in the left outer lower quadrant.

Final Assessment: Post Procedure Mammograms for Marker Placement

## 2022-01-17 MED ORDER — GADOBUTROL 1 MMOL/ML IV SOLN
7.0000 mL | Freq: Once | INTRAVENOUS | Status: AC | PRN
  Administered 2022-01-17: 7 mL via INTRAVENOUS

## 2022-01-18 ENCOUNTER — Telehealth: Payer: Self-pay | Admitting: Genetic Counselor

## 2022-01-18 ENCOUNTER — Encounter: Payer: Self-pay | Admitting: Genetic Counselor

## 2022-01-18 ENCOUNTER — Inpatient Hospital Stay: Payer: 59

## 2022-01-18 ENCOUNTER — Ambulatory Visit: Payer: Self-pay | Admitting: Genetic Counselor

## 2022-01-18 ENCOUNTER — Other Ambulatory Visit: Payer: Self-pay

## 2022-01-18 ENCOUNTER — Encounter: Payer: Self-pay | Admitting: *Deleted

## 2022-01-18 VITALS — BP 112/64 | HR 81 | Temp 98.1°F | Resp 18

## 2022-01-18 DIAGNOSIS — Z170 Estrogen receptor positive status [ER+]: Secondary | ICD-10-CM | POA: Diagnosis not present

## 2022-01-18 DIAGNOSIS — Z1379 Encounter for other screening for genetic and chromosomal anomalies: Secondary | ICD-10-CM | POA: Insufficient documentation

## 2022-01-18 DIAGNOSIS — C50411 Malignant neoplasm of upper-outer quadrant of right female breast: Secondary | ICD-10-CM

## 2022-01-18 DIAGNOSIS — Z8042 Family history of malignant neoplasm of prostate: Secondary | ICD-10-CM

## 2022-01-18 DIAGNOSIS — R293 Abnormal posture: Secondary | ICD-10-CM | POA: Diagnosis not present

## 2022-01-18 MED ORDER — PEGFILGRASTIM-BMEZ 6 MG/0.6ML ~~LOC~~ SOSY
6.0000 mg | PREFILLED_SYRINGE | Freq: Once | SUBCUTANEOUS | Status: AC
  Administered 2022-01-18: 6 mg via SUBCUTANEOUS
  Filled 2022-01-18: qty 0.6

## 2022-01-18 NOTE — Telephone Encounter (Signed)
Revealed negative genetic testing.  Discussed that we do not know why she has breast cancer or why there is cancer in the family. It could be sporadic/familial, due to a different gene that we are not testing, or maybe our current technology may not be able to pick something up.  It will be important for her to keep in contact with genetics to keep up with whether additional testing may be needed.   

## 2022-01-18 NOTE — Patient Instructions (Signed)

## 2022-01-18 NOTE — Progress Notes (Signed)
HPI:   Tonya Blair was previously seen in the [facility] due to a personal history of breast cancer and concerns regarding a hereditary predisposition to cancer. Please refer to our prior cancer genetics clinic note for more information regarding our discussion, assessment and recommendations, at the time. Tonya Blair recent genetic test results were disclosed to her, as were recommendations warranted by these results. These results and recommendations are discussed in more detail below.  CANCER HISTORY:  Oncology History  Malignant neoplasm of upper-outer quadrant of right breast in female, estrogen receptor positive ([place])  12/31/2021 Initial Diagnosis   Malignant neoplasm of upper-outer quadrant of right breast in female, estrogen receptor positive ([place])   01/02/2022 Cancer Staging   Staging form: Breast, AJCC 8th Edition - Clinical stage from 01/02/2022: Stage IB (cT2, cN0, cM0, G3, ER+, PR+, HER2+) - Signed by Benay Pike, MD on 01/02/2022 Histologic grading system: 3 grade system    01/14/2022 Genetic Testing   Negative hereditary cancer genetic testing: no pathogenic variants detected in [facility] CustomNext-Cancer +RNAinsight Panel.  The report date is January 14, 2022.  The CustomNext-Cancer+RNAinsight panel offered by [facility] includes sequencing and rearrangement analysis for the following 47 genes:  APC, ATM, AXIN2, BARD1, BMPR1A, BRCA1, BRCA2, BRIP1, CDH1, CDK4, CDKN2A, CHEK2, DICER1, EPCAM, GREM1, HOXB13, MEN1, MLH1, MSH2, MSH3, MSH6, MUTYH, NBN, NF1, NF2, NTHL1, PALB2, PMS2, POLD1, POLE, PTEN, RAD51C, RAD51D, RECQL, RET, SDHA, SDHAF2, SDHB, SDHC, SDHD, SMAD4, SMARCA4, STK11, TP53, TSC1, TSC2, and VHL.  RNA data is routinely analyzed for use in variant interpretation for all genes.   01/16/2022 -  Chemotherapy   Patient is on Treatment Plan : BREAST  Docetaxel + Carboplatin + Trastuzumab + Pertuzumab  (TCHP) q21d        FAMILY HISTORY:  We obtained a  detailed, 4-generation family history.  Significant diagnoses are listed below: Family History  Problem Relation Age of Onset   Prostate cancer Father 32     Tonya Blair is unaware of previous family history of genetic testing for hereditary cancer risks. There is no reported Ashkenazi Jewish ancestry. There is no known consanguinity.     GENETIC TEST RESULTS:  The CustomNext-Cancer +RNAinsight Panel found no pathogenic mutations. The CustomNext-Cancer+RNAinsight panel offered by [facility] includes sequencing and rearrangement analysis for the following 47 genes:  APC, ATM, AXIN2, BARD1, BMPR1A, BRCA1, BRCA2, BRIP1, CDH1, CDK4, CDKN2A, CHEK2, DICER1, EPCAM, GREM1, HOXB13, MEN1, MLH1, MSH2, MSH3, MSH6, MUTYH, NBN, NF1, NF2, NTHL1, PALB2, PMS2, POLD1, POLE, PTEN, RAD51C, RAD51D, RECQL, RET, SDHA, SDHAF2, SDHB, SDHC, SDHD, SMAD4, SMARCA4, STK11, TP53, TSC1, TSC2, and VHL.  RNA data is routinely analyzed for use in variant interpretation for all genes.   The test report has been scanned into EPIC and is located under the Molecular Pathology section of the Results Review tab.  A portion of the result report is included below for reference. Genetic testing reported out on January 14, 2022.       Even though a pathogenic variant was not identified, possible explanations for the cancer in the family may include: There may be no hereditary risk for cancer in the family. The cancers in Tonya Blair and/or her family may be sporadic/familial or due to other genetic and environmental factors. There may be a gene mutation in one of these genes that current testing methods cannot detect but that chance is small. There could be another gene that has not yet been discovered, or that we have not yet tested,  that is responsible for the cancer diagnoses in the family.    Therefore, it is important to remain in touch with cancer genetics in the future so that we can continue to offer Tonya Blair the most up  to date genetic testing.   ADDITIONAL GENETIC TESTING:  We discussed with Tonya Blair that her genetic testing was fairly extensive.  If there are other relevant genes identified to increase cancer risk that can be analyzed in the future, we would be happy to discuss and coordinate this testing at that time.    CANCER SCREENING RECOMMENDATIONS:  Tonya Blair test result is considered negative (normal).  This means that we have not identified a hereditary cause for her personal history of breast cancer at this time.   An individual's cancer risk and medical management are not determined by genetic test results alone. Overall cancer risk assessment incorporates additional factors, including personal medical history, family history, and any available genetic information that may result in a personalized plan for cancer prevention and surveillance. Therefore, it is recommended she continue to follow the cancer management and screening guidelines provided by her oncology and primary healthcare provider.  RECOMMENDATIONS FOR FAMILY MEMBERS:   Since she did not inherit a identifiable mutation in a cancer predisposition gene included on this panel, her children could not have inherited a known mutation from her in one of these genes. Individuals in this family might be at some increased risk of developing cancer, over the general population risk, due to the family history of cancer.  Individuals in the family should notify their providers of the family history of cancer. We recommend women in this family have a yearly mammogram beginning at age 27, or 60 years younger than the earliest onset of cancer, an annual clinical breast exam, and perform monthly breast self-exams.     FOLLOW-UP:  Lastly, we discussed with Tonya Blair that cancer genetics is a rapidly advancing field and it is possible that new genetic tests will be appropriate for her and/or her family members in the future. We encouraged her to remain  in contact with cancer genetics on an annual basis so we can update her personal and family histories and let her know of advances in cancer genetics that may benefit this family.   Our contact number was provided. Tonya Blair questions were answered to her satisfaction, and she knows she is welcome to call us at anytime with additional questions or concerns.   Salvatore Poe M. Joette Catching, [place], [facility] Genetic Counselor [email or web address] (P) [phone]

## 2022-01-21 ENCOUNTER — Encounter (HOSPITAL_COMMUNITY): Payer: Self-pay | Admitting: General Surgery

## 2022-01-21 NOTE — Progress Notes (Signed)
[place]  PROGRESS NOTE  Patient Care Team: Pcp, No as PCP - General Mauro Kaufmann, RN as Oncology Nurse Navigator Rockwell Germany, RN as Oncology Nurse Navigator Rolm Bookbinder, MD as Consulting Physician (General Surgery) Benay Pike, MD as Consulting Physician (Hematology and Oncology) Eppie Gibson, MD as Attending Physician (Radiation Oncology)  CHIEF COMPLAINTS:  Right breast invasive ductal carcinoma.  Oncology History  Malignant neoplasm of upper-outer quadrant of right breast in female, estrogen receptor positive ([place])  12/31/2021 Initial Diagnosis   Malignant neoplasm of upper-outer quadrant of right breast in female, estrogen receptor positive ([place])   01/02/2022 Cancer Staging   Staging form: Breast, AJCC 8th Edition - Clinical stage from 01/02/2022: Stage IB (cT2, cN0, cM0, G3, ER+, PR+, HER2+) - Signed by Benay Pike, MD on 01/02/2022 Histologic grading system: 3 grade system    01/14/2022 Genetic Testing   Negative hereditary cancer genetic testing: no pathogenic variants detected in [facility] CustomNext-Cancer +RNAinsight Panel.  The report date is January 14, 2022.  The CustomNext-Cancer+RNAinsight panel offered by [facility] includes sequencing and rearrangement analysis for the following 47 genes:  APC, ATM, AXIN2, BARD1, BMPR1A, BRCA1, BRCA2, BRIP1, CDH1, CDK4, CDKN2A, CHEK2, DICER1, EPCAM, GREM1, HOXB13, MEN1, MLH1, MSH2, MSH3, MSH6, MUTYH, NBN, NF1, NF2, NTHL1, PALB2, PMS2, POLD1, POLE, PTEN, RAD51C, RAD51D, RECQL, RET, SDHA, SDHAF2, SDHB, SDHC, SDHD, SMAD4, SMARCA4, STK11, TP53, TSC1, TSC2, and VHL.  RNA data is routinely analyzed for use in variant interpretation for all genes.   01/16/2022 -  Chemotherapy   Patient is on Treatment Plan : BREAST  Docetaxel + Carboplatin + Trastuzumab + Pertuzumab  (TCHP) q21d        ONCOLOGIC HISTORY: 1) 12/11/2021: Diagnostic mammogram and ultrasound showed suspicious mass in the 9 o'clock region  of the right breast. No evidence of enlarged adenopathy.  2) 12/25/2021: Underwent US guided right breast core needle biopsy of breast mass at 9 o'clock. Pathology showed invasive ductal carcinoma grade 3, ductal carcinoma in situ and calcifications, 5 cm from the nipple.  Prognostics from this mass showed ER 70% moderate, PR 20% moderate, HER2 positive by IHC, Ki-67 of 30%.  3) 01/09/2022:  MR Breast Bilateral: Biopsy proven malignancy in the outer right breast with associated linear/nodular enhancement extending anteriorly altogether measuring up to 5.1 cm. Indeterminate 0.6 cm tubular enhancing mass in the outer left breast.  4) 01/16/2022: Initiated neoadjuvant therapy with TCHP q 21 days given T2 tumor with HER2 amplification.   5) 01/17/2022: Underwent MRI guided biopsy of indeterminate 6 mm enhancing mass over the left outer lower quadrant. 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 No evidence of malignancy.    HISTORY OF PRESENTING ILLNESS:  Tonya Blair 46 y.o. female is here for a toxicity evaluation after initiate neoadjuvant TCHP therapy on 01/16/2022. Patient is accompanied by friend for this visit. She reports that she experience some fatigue following chemotherapy but she continues to stay active. She adds that she is walking one mile per day and continues to complete her daily ADLs independently. She reports decreased taste changes but is trying to eat to maintain her weight. She has lost approximately 5 lbs since starting treatment. She denies nausea, vomiting or abdominal pain. Since undergoing biopsy of her left breast, she has noticed bruising and tenderness at the biopsy site. She denies any redness or discharge at the biopsy site. She reports feeling jittery the day after treatment that resolved on its own. She has some mild diffuse dull  bone pain which does not interfere with her mobility. She denies fevers, chills, night sweats, shortness of breath,  chest pain, cough, peripheral edema or neuropathy. She has no other complaints. Remaining 10 point ROS was reviewed and negative.   MEDICAL HISTORY:  Past Medical History:  Diagnosis Date   Anemia    IDA   Cancer ([place])    right breast cancer   Family history of prostate cancer 01/02/2022    SURGICAL HISTORY: Past Surgical History:  Procedure Laterality Date   BREAST BIOPSY Right 12/25/2021   PORTACATH PLACEMENT N/A 01/14/2022   Procedure: INSERTION PORT-A-CATH;  Surgeon: Rolm Bookbinder, MD;  Location: [place];  Service: General;  Laterality: N/A;   THERAPEUTIC ABORTION     WISDOM TOOTH EXTRACTION      SOCIAL HISTORY: Social History   Socioeconomic History   Marital status: Single    Spouse name: Not on file   Number of children: Not on file   Years of education: Not on file   Highest education level: Not on file  Occupational History   Not on file  Tobacco Use   Smoking status: Never   Smokeless tobacco: Never  Vaping Use   Vaping Use: Never used  Substance and Sexual Activity   Alcohol use: Yes    Comment: occasional wine   Drug use: Never   Sexual activity: Yes    Birth control/protection: I.U.D.    Comment: Mirena IUD  Other Topics Concern   Not on file  Social History Narrative   Not on file   Social Determinants of Health   Financial Resource Strain: Low Risk    Difficulty of Paying Living Expenses: Not hard at all  Food Insecurity: No Food Insecurity   Worried About Charity fundraiser in the Last Year: Never true   Arboriculturist in the Last Year: Never true  Transportation Needs: No Transportation Needs   Lack of Transportation (Medical): No   Lack of Transportation (Non-Medical): No  Physical Activity: Not on file  Stress: Not on file  Social Connections: Not on file  Intimate Partner Violence: Not on file    FAMILY HISTORY: Family History  Problem Relation Age of Onset   Prostate cancer Father 76    ALLERGIES:  is allergic to  codeine.  MEDICATIONS:  Current Outpatient Medications  Medication Sig Dispense Refill   Ascorbic Acid (VITAMIN C PO) Take 1 tablet by mouth in the morning.     BIOTIN PO Take 1 tablet by mouth in the morning.     Cholecalciferol (VITAMIN D3 PO) Take 1 tablet by mouth in the morning.     dexamethasone (DECADRON) 4 MG tablet Take 2 tablets (8 mg total) by mouth 2 (two) times daily. Start the day before Taxotere. Then take daily x 3 days after chemotherapy. 30 tablet 1   FERROUS SULFATE PO Take 1 tablet by mouth in the morning.     ibuprofen (ADVIL) 200 MG tablet Take 200 mg by mouth every 8 (eight) hours as needed (pain.).     levonorgestrel (MIRENA, 52 MG,) 20 MCG/DAY IUD Mirena 20 mcg/24 hours (8 yrs) 52 mg intrauterine device     lidocaine-prilocaine (EMLA) cream Apply to affected area once 30 g 3   LORazepam (ATIVAN) 0.5 MG tablet Take 1 tablet (0.5 mg total) by mouth every 6 (six) hours as needed (Nausea or vomiting). 30 tablet 0   Multiple Minerals-Vitamins (CAL MAG ZINC +D3 PO) Take 1 tablet by mouth  in the morning.     ondansetron (ZOFRAN) 8 MG tablet Take 1 tablet (8 mg total) by mouth 2 (two) times daily as needed (Nausea or vomiting). Start on the third day after chemotherapy. 30 tablet 1   OVER THE COUNTER MEDICATION Take 1 capsule by mouth in the morning. Sea Moss Superfood Capsule     prochlorperazine (COMPAZINE) 10 MG tablet Take 1 tablet (10 mg total) by mouth every 6 (six) hours as needed (Nausea or vomiting). 30 tablet 1   traMADol (ULTRAM) 50 MG tablet Take 2 tablets (100 mg total) by mouth every 6 (six) hours as needed. 10 tablet 0   No current facility-administered medications for this visit.    PHYSICAL EXAMINATION: ECOG PERFORMANCE STATUS: 1 - Symptomatic but completely ambulatory  Vitals:   01/22/22 0932  BP: 107/60  Pulse: (!) 118  Resp: 20  Temp: 97.9 F (36.6 C)  SpO2: 100%    Filed Weights   01/22/22 0932  Weight: 193 lb 14.4 oz (88 kg)     GENERAL:alert, no distress and comfortable SKIN: skin color, texture, turgor are normal, no rashes or significant lesions EYES: normal, conjunctiva are pink and non-injected, sclera clear OROPHARYNX:no exudate, no erythema and lips, buccal mucosa, and tongue normal  NECK: supple, thyroid normal size, non-tender, without nodularity LYMPH:  no palpable lymphadenopathy in the cervical, axillary or inguinal LUNGS: clear to auscultation and percussion with normal breathing effort HEART: regular rate & rhythm and no murmurs and no lower extremity edema ABDOMEN:abdomen soft, non-tender and normal bowel sounds Musculoskeletal:no cyanosis of digits and no clubbing  PSYCH: alert & oriented x 3 with fluent speech NEURO: no focal motor/sensory deficits BREAST: Both breasts appear normal to inspection.  Palpable right breast mass at 9 o'clock position measuring just about 2 cm on physical exam, mobile on palpation. No nipple changes. Bruising and induration noted at left lower outer quadrant biopsy site without erythema or discharge.    LABORATORY DATA:  I have reviewed the data as listed Lab Results  Component Value Date   WBC 3.7 (L) 01/22/2022   HGB 12.0 01/22/2022   HCT 37.4 01/22/2022   MCV 80.4 01/22/2022   PLT 225 01/22/2022   Lab Results  Component Value Date   NA 135 01/22/2022   K 4.1 01/22/2022   CL 99 01/22/2022   CO2 31 01/22/2022    RADIOGRAPHIC STUDIES: I have personally reviewed the radiological reports and agreed with the findings in the report.  ASSESSMENT AND PLAN: Tonya Blair is a 46 y.o. female who presents for a follow up for right breast invasive ductal carcinoma.   #Right breast invasive ductal carcinoma --Biopsy proved on 12/25/2021. Pathology showed invasive ductal carcinoma grade 3, ductal carcinoma in situ and calcifications, 5 cm from the nipple.  Prognostics from this mass showed ER 70% moderate, PR 20% moderate, HER2 positive by IHC, Ki-67 of  30%. --Recommendation is neoadjuvant TCHP q 21 days given T2 tumor with HER2 amplification. Started Cycle 1 on 01/16/2022. --Labs today were reviewed without any intervention needed. ANC is 1400, secondary to chemotherapy. Continue to monitor.  --RTC on 02/04/2022 for port labs, follow up visit with Dr. Chryl Heck and Cycle 2, Day 1 of TCHP   #Indeterminate 0.6 cm tubular enhancing mass in the outer left breast: --Seen on MRI breast on 01/09/2022 --Underwent MRI guided biopsy on 01/17/2022. Pathology reports showed intraductal papilloma and fibrocystic changes. No evidence of malignancy.   #Left breast biopsy site ecchymosis and tenderness: --  Likely post biopsy changes --Due to pain and mild induration, we will obtain breast ultrasound to further evaluate.   All questions were answered. The patient knows to call the clinic with any problems, questions or concerns.  I have spent a total of 30 minutes minutes of face-to-face and non-face-to-face time, preparing to see the patient, performing a medically appropriate examination, counseling and educating the patient, ordering tests/procedures,documenting clinical information in the electronic health record, and care coordination.   Dede Query PA-C Dept of Hematology and [place] at [facility] Phone: [phone]

## 2022-01-22 ENCOUNTER — Other Ambulatory Visit: Payer: Self-pay | Admitting: Family Medicine

## 2022-01-22 ENCOUNTER — Inpatient Hospital Stay: Payer: 59

## 2022-01-22 ENCOUNTER — Encounter: Payer: Self-pay | Admitting: *Deleted

## 2022-01-22 ENCOUNTER — Inpatient Hospital Stay (HOSPITAL_BASED_OUTPATIENT_CLINIC_OR_DEPARTMENT_OTHER): Payer: 59 | Admitting: Physician Assistant

## 2022-01-22 ENCOUNTER — Other Ambulatory Visit: Payer: Self-pay

## 2022-01-22 ENCOUNTER — Other Ambulatory Visit: Payer: Self-pay | Admitting: Physician Assistant

## 2022-01-22 VITALS — BP 107/60 | HR 118 | Temp 97.9°F | Resp 20 | Wt 193.9 lb

## 2022-01-22 DIAGNOSIS — N644 Mastodynia: Secondary | ICD-10-CM

## 2022-01-22 DIAGNOSIS — C50411 Malignant neoplasm of upper-outer quadrant of right female breast: Secondary | ICD-10-CM | POA: Diagnosis not present

## 2022-01-22 DIAGNOSIS — R293 Abnormal posture: Secondary | ICD-10-CM | POA: Diagnosis not present

## 2022-01-22 DIAGNOSIS — Z170 Estrogen receptor positive status [ER+]: Secondary | ICD-10-CM | POA: Diagnosis not present

## 2022-01-22 LAB — CBC WITH DIFFERENTIAL/PLATELET
Abs Immature Granulocytes: 0.5 10*3/uL — ABNORMAL HIGH (ref 0.00–0.07)
Band Neutrophils: 4 %
Basophils Absolute: 0 10*3/uL (ref 0.0–0.1)
Basophils Relative: 1 %
Eosinophils Absolute: 0.1 10*3/uL (ref 0.0–0.5)
Eosinophils Relative: 2 %
HCT: 37.4 % (ref 36.0–46.0)
Hemoglobin: 12 g/dL (ref 12.0–15.0)
Lymphocytes Relative: 36 %
Lymphs Abs: 1.3 10*3/uL (ref 0.7–4.0)
MCH: 25.8 pg — ABNORMAL LOW (ref 26.0–34.0)
MCHC: 32.1 g/dL (ref 30.0–36.0)
MCV: 80.4 fL (ref 80.0–100.0)
Metamyelocytes Relative: 5 %
Monocytes Absolute: 0.3 10*3/uL (ref 0.1–1.0)
Monocytes Relative: 8 %
Myelocytes: 9 %
Neutro Abs: 1.4 10*3/uL — ABNORMAL LOW (ref 1.7–7.7)
Neutrophils Relative %: 35 %
Platelets: 225 10*3/uL (ref 150–400)
RBC: 4.65 MIL/uL (ref 3.87–5.11)
RDW: 14.7 % (ref 11.5–15.5)
WBC: 3.7 10*3/uL — ABNORMAL LOW (ref 4.0–10.5)
nRBC: 0 % (ref 0.0–0.2)

## 2022-01-22 LAB — COMPREHENSIVE METABOLIC PANEL
ALT: 19 U/L (ref 0–44)
AST: 13 U/L — ABNORMAL LOW (ref 15–41)
Albumin: 4.4 g/dL (ref 3.5–5.0)
Alkaline Phosphatase: 73 U/L (ref 38–126)
Anion gap: 5 (ref 5–15)
BUN: 12 mg/dL (ref 6–20)
CO2: 31 mmol/L (ref 22–32)
Calcium: 10.2 mg/dL (ref 8.9–10.3)
Chloride: 99 mmol/L (ref 98–111)
Creatinine, Ser: 1.02 mg/dL — ABNORMAL HIGH (ref 0.44–1.00)
GFR, Estimated: >60 mL/min (ref >60)
Glucose, Bld: 143 mg/dL — ABNORMAL HIGH (ref 70–99)
Potassium: 4.1 mmol/L (ref 3.5–5.1)
Sodium: 135 mmol/L (ref 135–145)
Total Bilirubin: 0.6 mg/dL (ref 0.3–1.2)
Total Protein: 7.4 g/dL (ref 6.5–8.1)

## 2022-01-23 ENCOUNTER — Telehealth: Payer: Self-pay

## 2022-01-23 NOTE — Telephone Encounter (Signed)
T/C to pt to advise of 2/13 appt for her Breast mamm/US of the left breast at GI.  Pt was also advised to monitor the biopsy site.Marland Kitchenif pain/swelling worsens in the next few days, please follow up with Dr. Rob Hickman team.  Pt with VU

## 2022-02-04 ENCOUNTER — Inpatient Hospital Stay: Payer: 59

## 2022-02-04 ENCOUNTER — Ambulatory Visit
Admission: RE | Admit: 2022-02-04 | Discharge: 2022-02-04 | Disposition: A | Discharge status: Home / Self Care | Payer: 59 | Source: Ambulatory Visit | Attending: Physician Assistant | Admitting: Physician Assistant

## 2022-02-04 ENCOUNTER — Inpatient Hospital Stay: Payer: 59 | Attending: Hematology and Oncology | Admitting: Hematology and Oncology

## 2022-02-04 ENCOUNTER — Encounter: Payer: Self-pay | Admitting: Hematology and Oncology

## 2022-02-04 ENCOUNTER — Ambulatory Visit: Payer: 59

## 2022-02-04 ENCOUNTER — Other Ambulatory Visit: Payer: Self-pay

## 2022-02-04 ENCOUNTER — Other Ambulatory Visit: Payer: 59

## 2022-02-04 VITALS — BP 124/51 | HR 82 | Temp 97.4°F | Resp 18 | Ht 62.0 in | Wt 195.4 lb

## 2022-02-04 DIAGNOSIS — C50411 Malignant neoplasm of upper-outer quadrant of right female breast: Secondary | ICD-10-CM

## 2022-02-04 DIAGNOSIS — N644 Mastodynia: Secondary | ICD-10-CM

## 2022-02-04 DIAGNOSIS — Z170 Estrogen receptor positive status [ER+]: Secondary | ICD-10-CM | POA: Insufficient documentation

## 2022-02-04 DIAGNOSIS — Z5189 Encounter for other specified aftercare: Secondary | ICD-10-CM | POA: Insufficient documentation

## 2022-02-04 DIAGNOSIS — Z95828 Presence of other vascular implants and grafts: Secondary | ICD-10-CM

## 2022-02-04 DIAGNOSIS — C50412 Malignant neoplasm of upper-outer quadrant of left female breast: Secondary | ICD-10-CM | POA: Diagnosis present

## 2022-02-04 DIAGNOSIS — Z5112 Encounter for antineoplastic immunotherapy: Secondary | ICD-10-CM | POA: Diagnosis present

## 2022-02-04 DIAGNOSIS — Z5111 Encounter for antineoplastic chemotherapy: Secondary | ICD-10-CM | POA: Diagnosis not present

## 2022-02-04 DIAGNOSIS — N641 Fat necrosis of breast: Secondary | ICD-10-CM | POA: Diagnosis not present

## 2022-02-04 LAB — COMPREHENSIVE METABOLIC PANEL
ALT: 27 U/L (ref 0–44)
AST: 14 U/L — ABNORMAL LOW (ref 15–41)
Albumin: 4.2 g/dL (ref 3.5–5.0)
Alkaline Phosphatase: 72 U/L (ref 38–126)
Anion gap: 4 — ABNORMAL LOW (ref 5–15)
BUN: 14 mg/dL (ref 6–20)
CO2: 28 mmol/L (ref 22–32)
Calcium: 9.2 mg/dL (ref 8.9–10.3)
Chloride: 106 mmol/L (ref 98–111)
Creatinine, Ser: 0.93 mg/dL (ref 0.44–1.00)
GFR, Estimated: >60 mL/min (ref >60)
Glucose, Bld: 153 mg/dL — ABNORMAL HIGH (ref 70–99)
Potassium: 3.9 mmol/L (ref 3.5–5.1)
Sodium: 138 mmol/L (ref 135–145)
Total Bilirubin: 0.3 mg/dL (ref 0.3–1.2)
Total Protein: 6.7 g/dL (ref 6.5–8.1)

## 2022-02-04 LAB — CBC WITH DIFFERENTIAL/PLATELET
Abs Immature Granulocytes: 0.07 10*3/uL (ref 0.00–0.07)
Basophils Absolute: 0 10*3/uL (ref 0.0–0.1)
Basophils Relative: 0 %
Eosinophils Absolute: 0 10*3/uL (ref 0.0–0.5)
Eosinophils Relative: 0 %
HCT: 33.3 % — ABNORMAL LOW (ref 36.0–46.0)
Hemoglobin: 10.4 g/dL — ABNORMAL LOW (ref 12.0–15.0)
Immature Granulocytes: 1 %
Lymphocytes Relative: 30 %
Lymphs Abs: 2.1 10*3/uL (ref 0.7–4.0)
MCH: 25.3 pg — ABNORMAL LOW (ref 26.0–34.0)
MCHC: 31.2 g/dL (ref 30.0–36.0)
MCV: 81 fL (ref 80.0–100.0)
Monocytes Absolute: 0.4 10*3/uL (ref 0.1–1.0)
Monocytes Relative: 5 %
Neutro Abs: 4.3 10*3/uL (ref 1.7–7.7)
Neutrophils Relative %: 64 %
Platelets: 272 10*3/uL (ref 150–400)
RBC: 4.11 MIL/uL (ref 3.87–5.11)
RDW: 15.6 % — ABNORMAL HIGH (ref 11.5–15.5)
WBC: 6.8 10*3/uL (ref 4.0–10.5)
nRBC: 0.4 % — ABNORMAL HIGH (ref 0.0–0.2)

## 2022-02-04 IMAGING — US US BREAST*L* LIMITED INC AXILLA
1 series · 12 of 12 positions shown · non-contrast
Comparison: Previous exam(s).

CLINICAL DATA: Patient presents for evaluation of the LEFT breast.
with ductal carcinoma in situ in the 9 o'clock location of the RIGHT
breast. The patient had MR guided core biopsy of the LEFT breast
performed [DATE], showing intraductal papilloma and fibrocystic
changes. After the MR guided core biopsy of the LEFT breast, she
developed significant bruising in the LATERAL portion of the LEFT
breast. There was associated pain. She was recently seen by her
primary care provider for pain and bruising in the LEFT breast. She
reports that the bruising has significantly improved. She still
feels thickening in the LOWER OUTER QUADRANT of the LEFT breast at
the biopsy site.

EXAM:
ULTRASOUND OF THE LEFT BREAST

[Series 1: us breast*left* limited inc axilla · 0.06mm/px · 12 acquisitions, 12 frames shown]
[im 1/12]
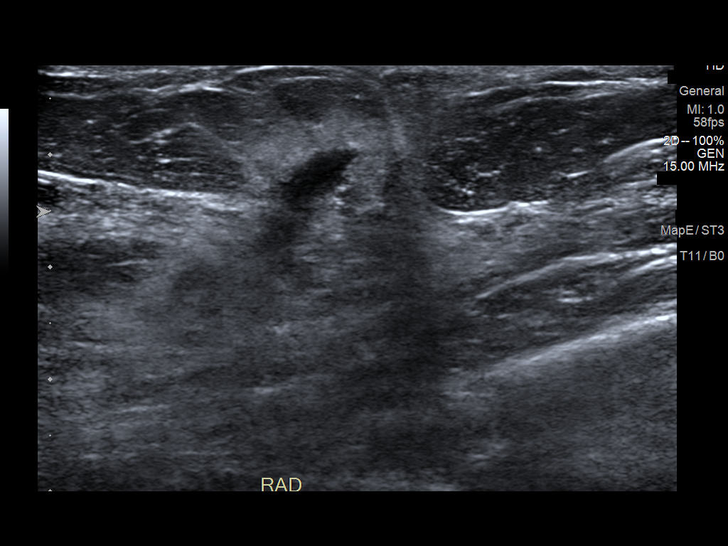
[im 2/12]
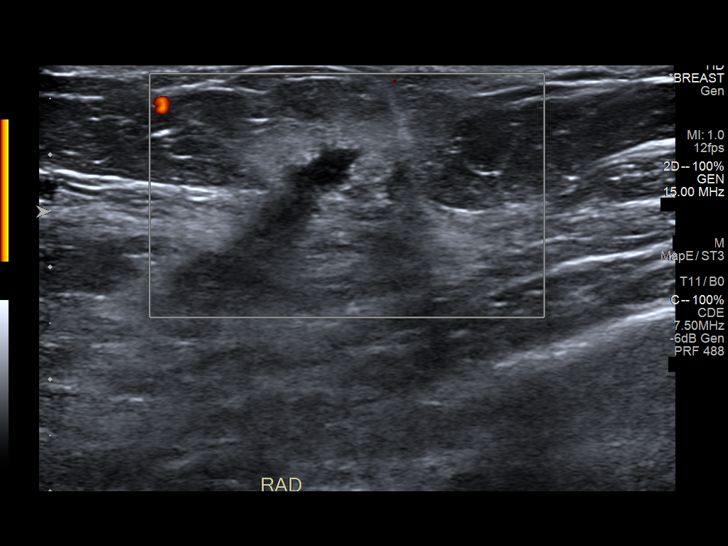
[im 3/12]
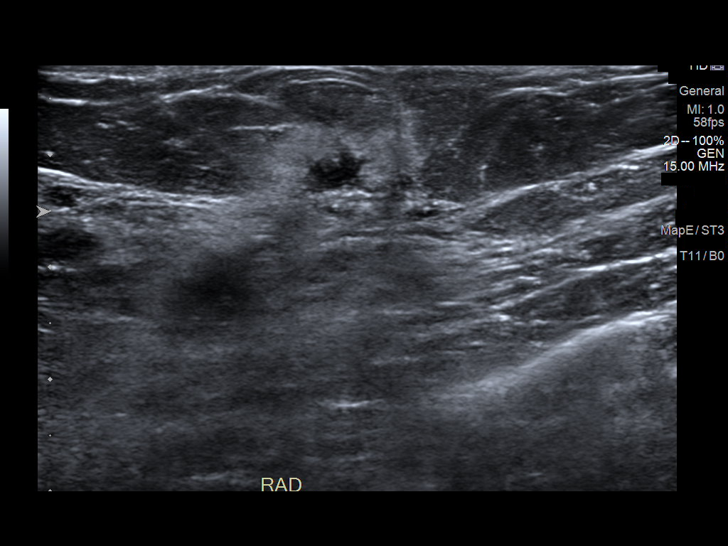
[im 4/12]
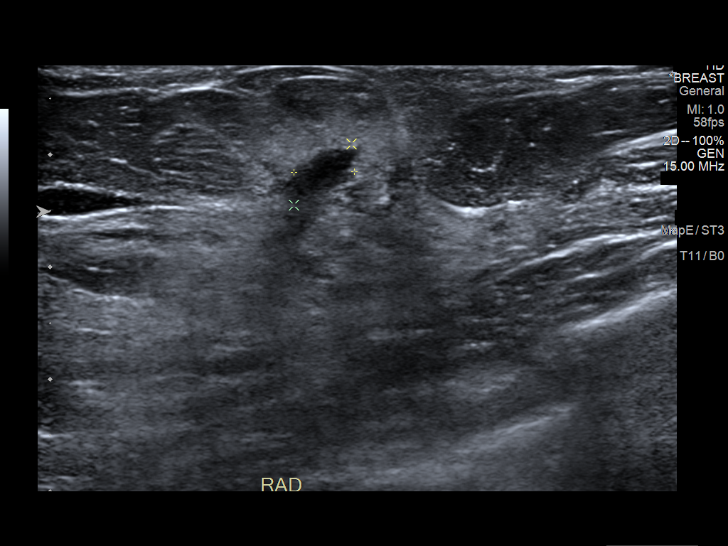
[im 5/12]
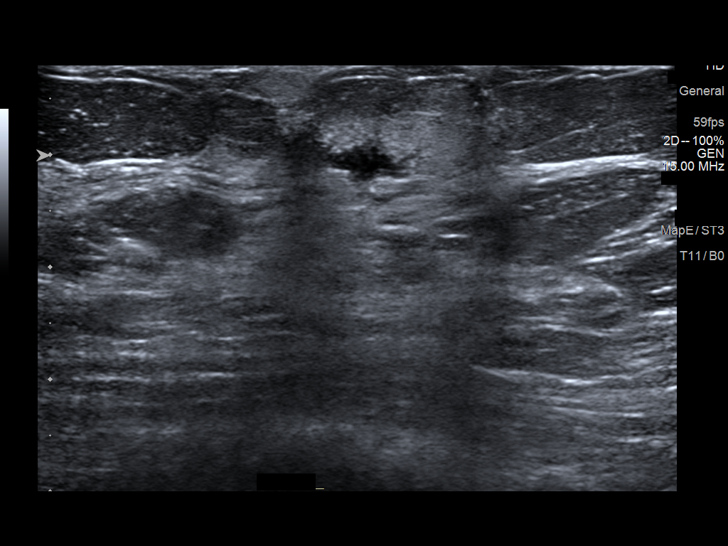
[im 6/12]
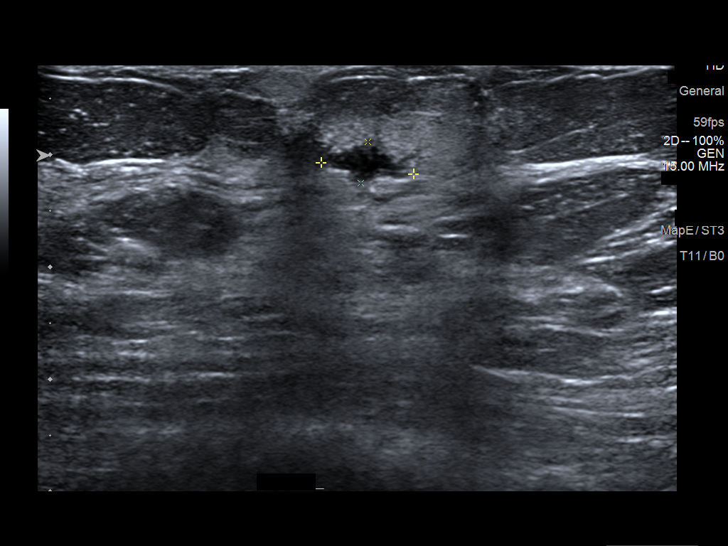
[im 7/12]
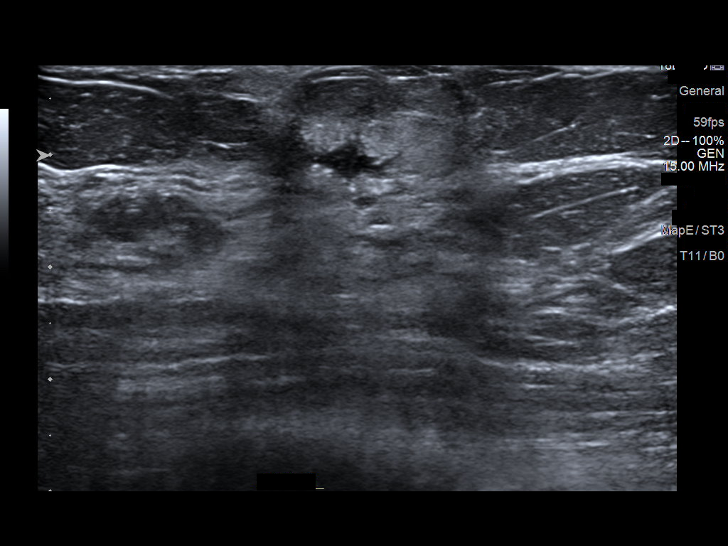
[im 8/12]
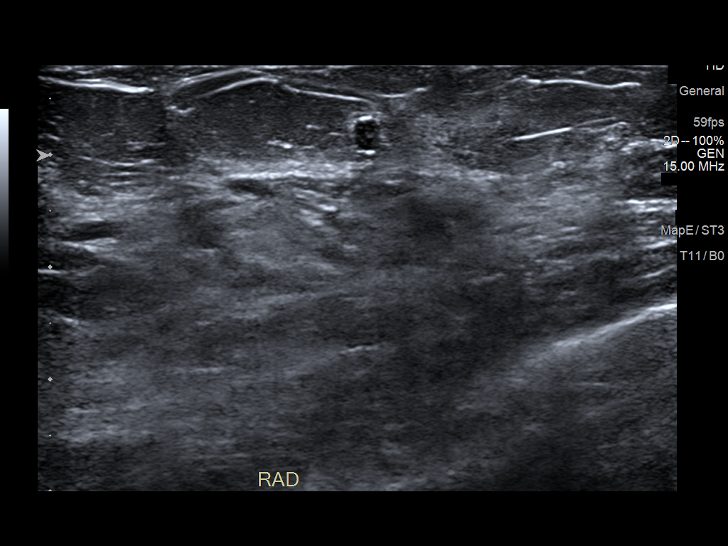
[im 9/12]
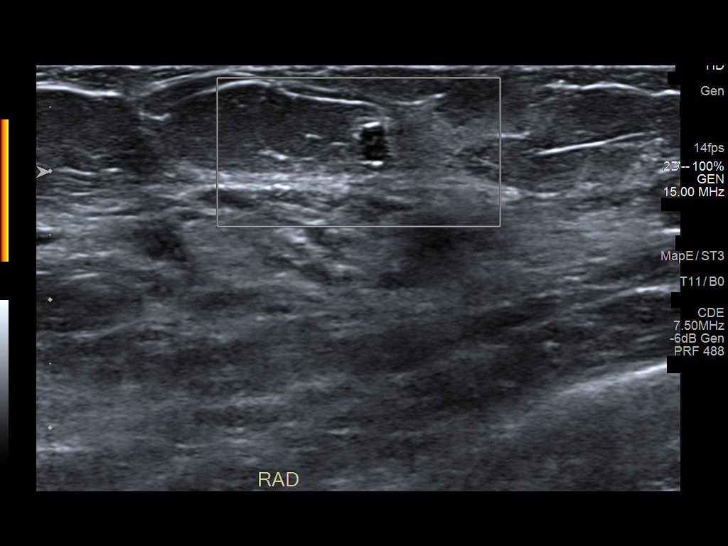
[im 10/12]
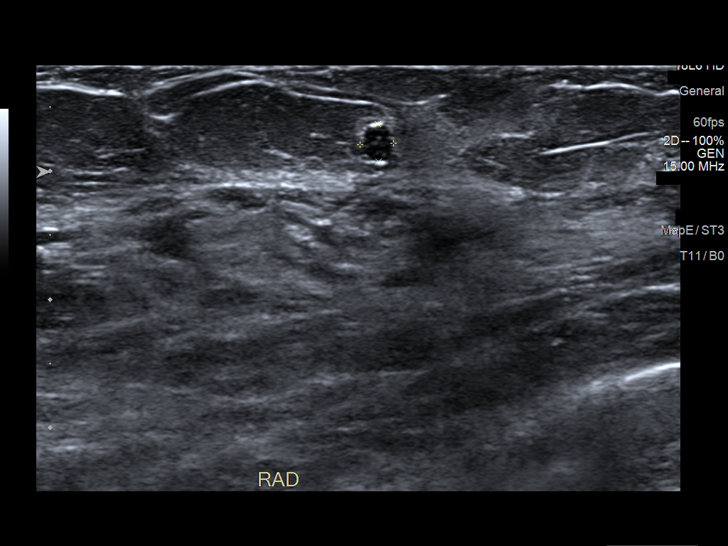
[im 11/12]
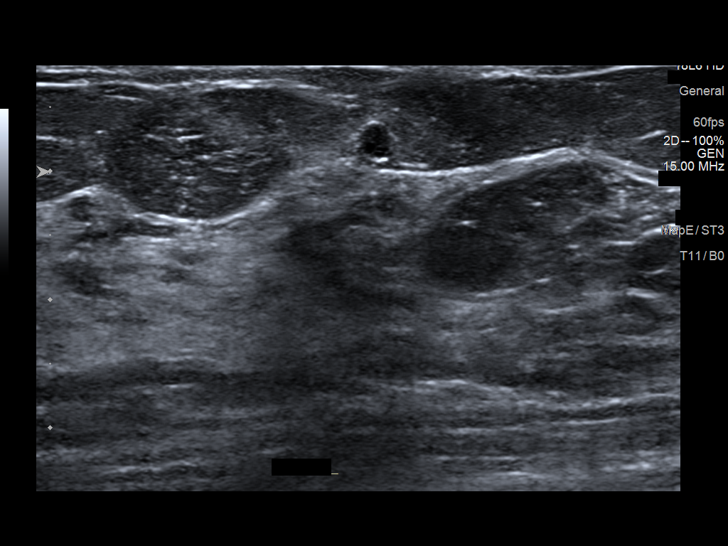
[im 12/12]
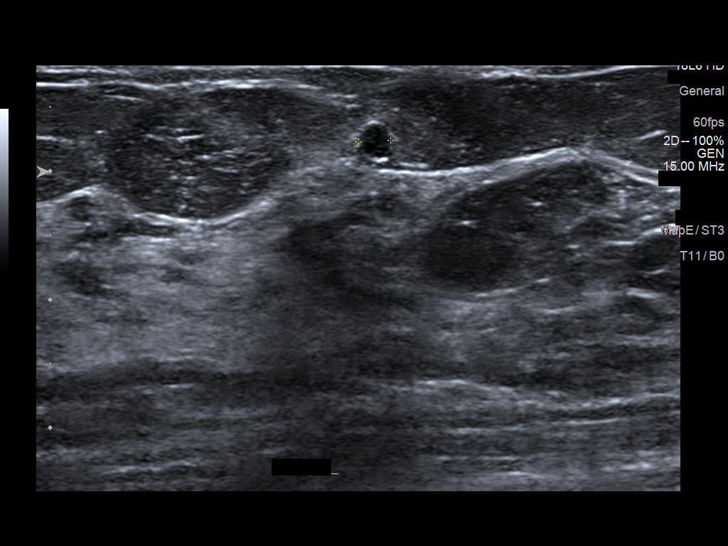

[12 of 12 positions shown; findings below may reference images not displayed]

FINDINGS: On physical exam, I palpate firm oblong thickening in the 4 o'clock
location of the LEFT breast. There is slight hyper pigmentation in
this region but no visible ecchymosis or erythema. Biopsy site is
well healed.

Targeted ultrasound is performed, showing hyperechoic breast
parenchyma and a hypoechoic biopsy tract in the 3:30 o'clock
location of the LEFT breast corresponding to the area of concern.
Largest fluid collection within the tract measures 0.8 x
centimeters. No drainable fluid collection identified. Small oil
cysts are also present in the region.
IMPRESSION: Findings are consistent benign fat necrosis following biopsy. There
is no sonographic evidence for malignancy.

RECOMMENDATION:
Recommendation is for treatment plan of known RIGHT breast
malignancy.

I have discussed the findings and recommendations with the patient.
If applicable, a reminder letter will be sent to the patient
regarding the next appointment.

BI-RADS CATEGORY  2: Benign.

## 2022-02-04 MED ORDER — SODIUM CHLORIDE 0.9% FLUSH
10.0000 mL | INTRAVENOUS | Status: DC | PRN
  Administered 2022-02-04: 10 mL via INTRAVENOUS

## 2022-02-04 MED ORDER — HEPARIN SOD (PORK) LOCK FLUSH 100 UNIT/ML IV SOLN
500.0000 [IU] | Freq: Once | INTRAVENOUS | Status: AC
  Administered 2022-02-04: 500 [IU] via INTRAVENOUS

## 2022-02-04 NOTE — Progress Notes (Signed)
[facility]  PROGRESS NOTE  Patient Care Team: Pcp, No as PCP - General Mauro Kaufmann, RN as Oncology Nurse Navigator Rockwell Germany, RN as Oncology Nurse Navigator Rolm Bookbinder, MD as Consulting Physician (General Surgery) Benay Pike, MD as Consulting Physician (Hematology and Oncology) Eppie Gibson, MD as Attending Physician (Radiation Oncology)  CHIEF COMPLAINTS:  Right breast invasive ductal carcinoma.  Oncology History  Malignant neoplasm of upper-outer quadrant of right breast in female, estrogen receptor positive ([place])  12/31/2021 Initial Diagnosis   Malignant neoplasm of upper-outer quadrant of right breast in female, estrogen receptor positive ([place])   01/02/2022 Cancer Staging   Staging form: Breast, AJCC 8th Edition - Clinical stage from 01/02/2022: Stage IB (cT2, cN0, cM0, G3, ER+, PR+, HER2+) - Signed by Benay Pike, MD on 01/02/2022 Histologic grading system: 3 grade system    01/14/2022 Genetic Testing   Negative hereditary cancer genetic testing: no pathogenic variants detected in [facility] CustomNext-Cancer +RNAinsight Panel.  The report date is January 14, 2022.  The CustomNext-Cancer+RNAinsight panel offered by [facility] includes sequencing and rearrangement analysis for the following 47 genes:  APC, ATM, AXIN2, BARD1, BMPR1A, BRCA1, BRCA2, BRIP1, CDH1, CDK4, CDKN2A, CHEK2, DICER1, EPCAM, GREM1, HOXB13, MEN1, MLH1, MSH2, MSH3, MSH6, MUTYH, NBN, NF1, NF2, NTHL1, PALB2, PMS2, POLD1, POLE, PTEN, RAD51C, RAD51D, RECQL, RET, SDHA, SDHAF2, SDHB, SDHC, SDHD, SMAD4, SMARCA4, STK11, TP53, TSC1, TSC2, and VHL.  RNA data is routinely analyzed for use in variant interpretation for all genes.   01/16/2022 -  Chemotherapy   Patient is on Treatment Plan : BREAST  Docetaxel + Carboplatin + Trastuzumab + Pertuzumab  (TCHP) q21d        ONCOLOGIC HISTORY: 1) 12/11/2021: Diagnostic mammogram and ultrasound showed suspicious mass in the 9 o'clock region  of the right breast. No evidence of enlarged adenopathy.  2) 12/25/2021: Underwent US guided right breast core needle biopsy of breast mass at 9 o'clock. Pathology showed invasive ductal carcinoma grade 3, ductal carcinoma in situ and calcifications, 5 cm from the nipple.  Prognostics from this mass showed ER 70% moderate, PR 20% moderate, HER2 positive by IHC, Ki-67 of 30%.  3) 01/09/2022:  MR Breast Bilateral: Biopsy proven malignancy in the outer right breast with associated linear/nodular enhancement extending anteriorly altogether measuring up to 5.1 cm. Indeterminate 0.6 cm tubular enhancing mass in the outer left breast.  4) 01/16/2022: Initiated neoadjuvant therapy with TCHP q 21 days given T2 tumor with HER2 amplification.   5) 01/17/2022: Underwent MRI guided biopsy of indeterminate 6 mm enhancing mass over the left outer lower quadrant. 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 No evidence of malignancy.    HISTORY OF PRESENTING ILLNESS:   Tonya Blair 46 y.o. female is here for a toxicity evaluation after initiate neoadjuvant TCHP therapy on 01/16/2022. Patient is here for follow-up before cycle 2 of TCHP.  Since last visit, she has been doing really well.  She denies any new health complaints.  She has finally recovered from the first chemotherapy.  Overall she tolerated the first cycle of chemotherapy very well without any major episodes of nausea or vomiting or diarrhea.  She continues to stay active, walks every day or does Zumba.  No neuropathy reported.  No significant change in the left breast mass.  Rest of the pertinent 10 point ROS reviewed and negative.  MEDICAL HISTORY:  Past Medical History:  Diagnosis Date   Anemia    IDA   Cancer ([facility])  right breast cancer   Family history of prostate cancer 01/02/2022    SURGICAL HISTORY: Past Surgical History:  Procedure Laterality Date   BREAST BIOPSY Right 12/25/2021   PORTACATH PLACEMENT  N/A 01/14/2022   Procedure: INSERTION PORT-A-CATH;  Surgeon: Rolm Bookbinder, MD;  Location: [facility];  Service: General;  Laterality: N/A;   THERAPEUTIC ABORTION     WISDOM TOOTH EXTRACTION      SOCIAL HISTORY: Social History   Socioeconomic History   Marital status: Single    Spouse name: Not on file   Number of children: Not on file   Years of education: Not on file   Highest education level: Not on file  Occupational History   Not on file  Tobacco Use   Smoking status: Never   Smokeless tobacco: Never  Vaping Use   Vaping Use: Never used  Substance and Sexual Activity   Alcohol use: Yes    Comment: occasional wine   Drug use: Never   Sexual activity: Yes    Birth control/protection: I.U.D.    Comment: Mirena IUD  Other Topics Concern   Not on file  Social History Narrative   Not on file   Social Determinants of Health   Financial Resource Strain: Low Risk    Difficulty of Paying Living Expenses: Not hard at all  Food Insecurity: No Food Insecurity   Worried About Charity fundraiser in the Last Year: Never true   Arboriculturist in the Last Year: Never true  Transportation Needs: No Transportation Needs   Lack of Transportation (Medical): No   Lack of Transportation (Non-Medical): No  Physical Activity: Not on file  Stress: Not on file  Social Connections: Not on file  Intimate Partner Violence: Not on file    FAMILY HISTORY: Family History  Problem Relation Age of Onset   Prostate cancer Father 81    ALLERGIES:  is allergic to codeine.  MEDICATIONS:  Current Outpatient Medications  Medication Sig Dispense Refill   Ascorbic Acid (VITAMIN C PO) Take 1 tablet by mouth in the morning.     BIOTIN PO Take 1 tablet by mouth in the morning.     Cholecalciferol (VITAMIN D3 PO) Take 1 tablet by mouth in the morning.     dexamethasone (DECADRON) 4 MG tablet Take 2 tablets (8 mg total) by mouth 2 (two) times daily. Start the day before Taxotere. Then take daily  x 3 days after chemotherapy. 30 tablet 1   FERROUS SULFATE PO Take 1 tablet by mouth in the morning.     ibuprofen (ADVIL) 200 MG tablet Take 200 mg by mouth every 8 (eight) hours as needed (pain.).     levonorgestrel (MIRENA, 52 MG,) 20 MCG/DAY IUD Mirena 20 mcg/24 hours (8 yrs) 52 mg intrauterine device     lidocaine-prilocaine (EMLA) cream Apply to affected area once 30 g 3   LORazepam (ATIVAN) 0.5 MG tablet Take 1 tablet (0.5 mg total) by mouth every 6 (six) hours as needed (Nausea or vomiting). 30 tablet 0   Multiple Minerals-Vitamins (CAL MAG ZINC +D3 PO) Take 1 tablet by mouth in the morning.     ondansetron (ZOFRAN) 8 MG tablet Take 1 tablet (8 mg total) by mouth 2 (two) times daily as needed (Nausea or vomiting). Start on the third day after chemotherapy. 30 tablet 1   OVER THE COUNTER MEDICATION Take 1 capsule by mouth in the morning. Sea [employer] Superfood Capsule     prochlorperazine (COMPAZINE)  10 MG tablet Take 1 tablet (10 mg total) by mouth every 6 (six) hours as needed (Nausea or vomiting). 30 tablet 1   traMADol (ULTRAM) 50 MG tablet Take 2 tablets (100 mg total) by mouth every 6 (six) hours as needed. 10 tablet 0   No current facility-administered medications for this visit.    PHYSICAL EXAMINATION: ECOG PERFORMANCE STATUS: 1 - Symptomatic but completely ambulatory  Vitals:   02/04/22 1147  BP: (!) 124/51  Pulse: 82  Resp: 18  Temp: (!) 97.4 F (36.3 C)  SpO2: 100%     Filed Weights   02/04/22 1147  Weight: 195 lb 6.4 oz (88.6 kg)    GENERAL:alert, no distress and comfortable SKIN: skin color, texture, turgor are normal, no rashes or significant lesions EYES: normal, conjunctiva are pink and non-injected, sclera clear OROPHARYNX:no exudate, no erythema and lips, buccal mucosa, and tongue normal  NECK: supple, thyroid normal size, non-tender, without nodularity LYMPH:  no palpable lymphadenopathy in the cervical, axillary or inguinal LUNGS: clear to auscultation  and percussion with normal breathing effort HEART: regular rate & rhythm and no murmurs and no lower extremity edema ABDOMEN:abdomen soft, non-tender and normal bowel sounds Musculoskeletal:no cyanosis of digits and no clubbing  PSYCH: alert & oriented x 3 with fluent speech NEURO: no focal motor/sensory deficits BREAST: Both breasts appear normal to inspection.   Palpable right breast mass at 9 o'clock position measuring just about 2 cm on physical exam, no significant changes. Left breast s/p biopsy   LABORATORY DATA:  I have reviewed the data as listed Lab Results  Component Value Date   WBC 6.8 02/04/2022   HGB 10.4 (L) 02/04/2022   HCT 33.3 (L) 02/04/2022   MCV 81.0 02/04/2022   PLT 272 02/04/2022   Lab Results  Component Value Date   NA 138 02/04/2022   K 3.9 02/04/2022   CL 106 02/04/2022   CO2 28 02/04/2022    RADIOGRAPHIC STUDIES: I have personally reviewed the radiological reports and agreed with the findings in the report.  ASSESSMENT AND PLAN:  Tonya Blair is a 46 y.o. female who presents for a follow up for right breast invasive ductal carcinoma.   #Right breast invasive ductal carcinoma --Biopsy proved on 12/25/2021. Pathology showed invasive ductal carcinoma grade 3, ductal carcinoma in situ and calcifications, 5 cm from the nipple.  Prognostics from this mass showed ER 70% moderate, PR 20% moderate, HER2 positive by IHC, Ki-67 of 30%. --Recommendation is neoadjuvant TCHP q 21 days given T2 tumor with HER2 amplification. Started Cycle 1 on 01/16/2022. --Labs today were reviewed without any intervention needed. ANC is 1400, secondary to chemotherapy. Continue to monitor.  -- C2D1 due 02/05/2022 No concerning review of systems or physical examination findings.  Okay to proceed with cycle 2 as planned if labs are all within parameters.  #Indeterminate 0.6 cm tubular enhancing mass in the outer left breast: --Seen on MRI breast on 01/09/2022 --Underwent MRI  guided biopsy on 01/17/2022.  Pathology reports showed intraductal papilloma and fibrocystic changes. No evidence of malignancy.   Return to clinic in 3 weeks as scheduled  All questions were answered. The patient knows to call the clinic with any problems, questions or concerns.  I have spent a total of 20 minutes minutes of face-to-face and non-face-to-face time, preparing to see the patient, performing a medically appropriate examination, counseling and educating the patient, ordering tests/procedures,documenting clinical information in the electronic health record, and care coordination.

## 2022-02-05 ENCOUNTER — Inpatient Hospital Stay: Payer: 59

## 2022-02-05 VITALS — BP 119/64 | HR 99 | Temp 98.7°F | Resp 18

## 2022-02-05 DIAGNOSIS — Z5111 Encounter for antineoplastic chemotherapy: Secondary | ICD-10-CM | POA: Diagnosis not present

## 2022-02-05 DIAGNOSIS — Z170 Estrogen receptor positive status [ER+]: Secondary | ICD-10-CM

## 2022-02-05 MED ORDER — PALONOSETRON HCL INJECTION 0.25 MG/5ML
0.2500 mg | Freq: Once | INTRAVENOUS | Status: AC
  Administered 2022-02-05: 0.25 mg via INTRAVENOUS
  Filled 2022-02-05: qty 5

## 2022-02-05 MED ORDER — SODIUM CHLORIDE 0.9 % IV SOLN
420.0000 mg | Freq: Once | INTRAVENOUS | Status: AC
  Administered 2022-02-05: 420 mg via INTRAVENOUS
  Filled 2022-02-05: qty 14

## 2022-02-05 MED ORDER — ACETAMINOPHEN 325 MG PO TABS
650.0000 mg | ORAL_TABLET | Freq: Once | ORAL | Status: AC
  Administered 2022-02-05: 650 mg via ORAL
  Filled 2022-02-05: qty 2

## 2022-02-05 MED ORDER — DIPHENHYDRAMINE HCL 25 MG PO CAPS
50.0000 mg | ORAL_CAPSULE | Freq: Once | ORAL | Status: AC
  Administered 2022-02-05: 50 mg via ORAL
  Filled 2022-02-05: qty 2

## 2022-02-05 MED ORDER — TRASTUZUMAB-DKST CHEMO 150 MG IV SOLR
6.0000 mg/kg | Freq: Once | INTRAVENOUS | Status: AC
  Administered 2022-02-05: 546 mg via INTRAVENOUS
  Filled 2022-02-05: qty 26

## 2022-02-05 MED ORDER — SODIUM CHLORIDE 0.9 % IV SOLN
75.0000 mg/m2 | Freq: Once | INTRAVENOUS | Status: AC
  Administered 2022-02-05: 150 mg via INTRAVENOUS
  Filled 2022-02-05: qty 15

## 2022-02-05 MED ORDER — SODIUM CHLORIDE 0.9 % IV SOLN: Freq: Once | INTRAVENOUS | Status: AC

## 2022-02-05 MED ORDER — HEPARIN SOD (PORK) LOCK FLUSH 100 UNIT/ML IV SOLN
500.0000 [IU] | Freq: Once | INTRAVENOUS | Status: AC | PRN
  Administered 2022-02-05: 500 [IU]

## 2022-02-05 MED ORDER — SODIUM CHLORIDE 0.9 % IV SOLN
150.0000 mg | Freq: Once | INTRAVENOUS | Status: AC
  Administered 2022-02-05: 150 mg via INTRAVENOUS
  Filled 2022-02-05: qty 150

## 2022-02-05 MED ORDER — SODIUM CHLORIDE 0.9 % IV SOLN
700.0000 mg | Freq: Once | INTRAVENOUS | Status: AC
  Administered 2022-02-05: 700 mg via INTRAVENOUS
  Filled 2022-02-05: qty 70

## 2022-02-05 MED ORDER — SODIUM CHLORIDE 0.9% FLUSH
10.0000 mL | INTRAVENOUS | Status: DC | PRN
  Administered 2022-02-05: 10 mL

## 2022-02-05 MED ORDER — SODIUM CHLORIDE 0.9 % IV SOLN
10.0000 mg | Freq: Once | INTRAVENOUS | Status: AC
  Administered 2022-02-05: 10 mg via INTRAVENOUS
  Filled 2022-02-05: qty 10

## 2022-02-05 NOTE — Patient Instructions (Signed)
[facility] ONCOLOGY   Discharge Instructions: Thank you for choosing [facility] to provide your oncology and hematology care.   If you have a lab appointment with the [facility], please go directly to the [facility] and check in at the registration area.   Wear comfortable clothing and clothing appropriate for easy access to any Portacath or PICC line.   We strive to give you quality time with your provider. You may need to reschedule your appointment if you arrive late (15 or more minutes).  Arriving late affects you and other patients whose appointments are after yours.  Also, if you miss three or more appointments without notifying the office, you may be dismissed from the clinic at the providers discretion.      For prescription refill requests, have your pharmacy contact our office and allow 72 hours for refills to be completed.    Today you received the following chemotherapy and/or immunotherapy agents: trastuzumab-dkst, pertuzumab, docetaxel, and carboplatin      To help prevent nausea and vomiting after your treatment, we encourage you to take your nausea medication as directed.  BELOW ARE SYMPTOMS THAT SHOULD BE REPORTED IMMEDIATELY: *FEVER GREATER THAN 100.4 F (38 C) OR HIGHER *CHILLS OR SWEATING *NAUSEA AND VOMITING THAT IS NOT CONTROLLED WITH YOUR NAUSEA MEDICATION *UNUSUAL SHORTNESS OF BREATH *UNUSUAL BRUISING OR BLEEDING *URINARY PROBLEMS (pain or burning when urinating, or frequent urination) *BOWEL PROBLEMS (unusual diarrhea, constipation, pain near the anus) TENDERNESS IN MOUTH AND THROAT WITH OR WITHOUT PRESENCE OF ULCERS (sore throat, sores in mouth, or a toothache) UNUSUAL RASH, SWELLING OR PAIN  UNUSUAL VAGINAL DISCHARGE OR ITCHING   Items with * indicate a potential emergency and should be followed up as soon as possible or go to the Emergency Department if any problems should occur.  Please show the CHEMOTHERAPY ALERT  CARD or IMMUNOTHERAPY ALERT CARD at check-in to the Emergency Department and triage nurse.  Should you have questions after your visit or need to cancel or reschedule your appointment, please contact [facility]  Dept: [phone]  and follow the prompts.  Office hours are 8:00 a.m. to 4:30 p.m. Monday - Friday. Please note that voicemails left after 4:00 p.m. may not be returned until the following business day.  We are closed weekends and major holidays. You have access to a nurse at all times for urgent questions. Please call the main number to the clinic Dept: [phone] and follow the prompts.   For any non-urgent questions, you may also contact your provider using MyChart. We now offer e-Visits for anyone 37 and older to request care online for non-urgent symptoms. For details visit [email or web address].   Also download the MyChart app! Go to the app store, search "MyChart", open the app, select [facility], and log in with your MyChart username and password.  Due to Covid, a mask is required upon entering the hospital/clinic. If you do not have a mask, one will be given to you upon arrival. For doctor visits, patients may have 1 support person aged 52 or older with them. For treatment visits, patients cannot have anyone with them due to current Covid guidelines and our immunocompromised population.

## 2022-02-06 ENCOUNTER — Ambulatory Visit: Payer: 59

## 2022-02-07 ENCOUNTER — Inpatient Hospital Stay: Payer: 59

## 2022-02-07 ENCOUNTER — Other Ambulatory Visit: Payer: Self-pay

## 2022-02-07 VITALS — BP 114/67 | HR 91 | Temp 98.9°F | Resp 18

## 2022-02-07 DIAGNOSIS — Z170 Estrogen receptor positive status [ER+]: Secondary | ICD-10-CM

## 2022-02-07 DIAGNOSIS — Z5111 Encounter for antineoplastic chemotherapy: Secondary | ICD-10-CM | POA: Diagnosis not present

## 2022-02-07 DIAGNOSIS — C50411 Malignant neoplasm of upper-outer quadrant of right female breast: Secondary | ICD-10-CM

## 2022-02-07 MED ORDER — PEGFILGRASTIM-BMEZ 6 MG/0.6ML ~~LOC~~ SOSY
6.0000 mg | PREFILLED_SYRINGE | Freq: Once | SUBCUTANEOUS | Status: AC
  Administered 2022-02-07: 6 mg via SUBCUTANEOUS
  Filled 2022-02-07: qty 0.6

## 2022-02-18 ENCOUNTER — Encounter: Payer: Self-pay | Admitting: Hematology and Oncology

## 2022-02-20 ENCOUNTER — Encounter: Payer: Self-pay | Admitting: Hematology and Oncology

## 2022-02-22 ENCOUNTER — Other Ambulatory Visit: Payer: Self-pay | Admitting: Hematology and Oncology

## 2022-02-22 ENCOUNTER — Telehealth: Payer: Self-pay

## 2022-02-22 NOTE — Telephone Encounter (Signed)
Notified Patient of completion of FMLA forms. Copy placed at Registration in bin for pick-up as requested by Patient. No other needs or concerns voiced at this time. ?

## 2022-02-26 MED FILL — Dexamethasone Sodium Phosphate Inj 100 MG/10ML: INTRAMUSCULAR | Qty: 1 | Status: AC

## 2022-02-26 MED FILL — Fosaprepitant Dimeglumine For IV Infusion 150 MG (Base Eq): INTRAVENOUS | Qty: 5 | Status: AC

## 2022-02-27 ENCOUNTER — Inpatient Hospital Stay (HOSPITAL_BASED_OUTPATIENT_CLINIC_OR_DEPARTMENT_OTHER): Payer: 59 | Admitting: Adult Health

## 2022-02-27 ENCOUNTER — Encounter: Payer: Self-pay | Admitting: Adult Health

## 2022-02-27 ENCOUNTER — Inpatient Hospital Stay: Payer: 59 | Attending: Hematology and Oncology

## 2022-02-27 ENCOUNTER — Other Ambulatory Visit: Payer: Self-pay

## 2022-02-27 ENCOUNTER — Inpatient Hospital Stay: Payer: 59

## 2022-02-27 VITALS — BP 119/76 | HR 83 | Resp 17

## 2022-02-27 VITALS — BP 128/75 | HR 96 | Temp 97.7°F | Resp 18 | Ht 62.0 in | Wt 190.6 lb

## 2022-02-27 DIAGNOSIS — Z5189 Encounter for other specified aftercare: Secondary | ICD-10-CM | POA: Insufficient documentation

## 2022-02-27 DIAGNOSIS — C50411 Malignant neoplasm of upper-outer quadrant of right female breast: Secondary | ICD-10-CM

## 2022-02-27 DIAGNOSIS — C50412 Malignant neoplasm of upper-outer quadrant of left female breast: Secondary | ICD-10-CM | POA: Diagnosis present

## 2022-02-27 DIAGNOSIS — Z170 Estrogen receptor positive status [ER+]: Secondary | ICD-10-CM

## 2022-02-27 DIAGNOSIS — Z5111 Encounter for antineoplastic chemotherapy: Secondary | ICD-10-CM | POA: Insufficient documentation

## 2022-02-27 DIAGNOSIS — T451X5A Adverse effect of antineoplastic and immunosuppressive drugs, initial encounter: Secondary | ICD-10-CM | POA: Diagnosis not present

## 2022-02-27 DIAGNOSIS — R197 Diarrhea, unspecified: Secondary | ICD-10-CM | POA: Diagnosis not present

## 2022-02-27 DIAGNOSIS — Z5112 Encounter for antineoplastic immunotherapy: Secondary | ICD-10-CM | POA: Diagnosis present

## 2022-02-27 DIAGNOSIS — Z95828 Presence of other vascular implants and grafts: Secondary | ICD-10-CM

## 2022-02-27 DIAGNOSIS — Z79899 Other long term (current) drug therapy: Secondary | ICD-10-CM | POA: Diagnosis not present

## 2022-02-27 LAB — CBC WITH DIFFERENTIAL/PLATELET
Abs Immature Granulocytes: 0.1 10*3/uL — ABNORMAL HIGH (ref 0.00–0.07)
Basophils Absolute: 0 10*3/uL (ref 0.0–0.1)
Basophils Relative: 0 %
Eosinophils Absolute: 0 10*3/uL (ref 0.0–0.5)
Eosinophils Relative: 0 %
HCT: 30.3 % — ABNORMAL LOW (ref 36.0–46.0)
Hemoglobin: 9.6 g/dL — ABNORMAL LOW (ref 12.0–15.0)
Immature Granulocytes: 1 %
Lymphocytes Relative: 18 %
Lymphs Abs: 2 10*3/uL (ref 0.7–4.0)
MCH: 25.9 pg — ABNORMAL LOW (ref 26.0–34.0)
MCHC: 31.7 g/dL (ref 30.0–36.0)
MCV: 81.7 fL (ref 80.0–100.0)
Monocytes Absolute: 0.6 10*3/uL (ref 0.1–1.0)
Monocytes Relative: 6 %
Neutro Abs: 8.7 10*3/uL — ABNORMAL HIGH (ref 1.7–7.7)
Neutrophils Relative %: 75 %
Platelets: 315 10*3/uL (ref 150–400)
RBC: 3.71 MIL/uL — ABNORMAL LOW (ref 3.87–5.11)
RDW: 17.6 % — ABNORMAL HIGH (ref 11.5–15.5)
WBC: 11.4 10*3/uL — ABNORMAL HIGH (ref 4.0–10.5)
nRBC: 0 % (ref 0.0–0.2)

## 2022-02-27 LAB — COMPREHENSIVE METABOLIC PANEL
ALT: 19 U/L (ref 0–44)
AST: 13 U/L — ABNORMAL LOW (ref 15–41)
Albumin: 4.4 g/dL (ref 3.5–5.0)
Alkaline Phosphatase: 76 U/L (ref 38–126)
Anion gap: 9 (ref 5–15)
BUN: 18 mg/dL (ref 6–20)
CO2: 25 mmol/L (ref 22–32)
Calcium: 9.8 mg/dL (ref 8.9–10.3)
Chloride: 105 mmol/L (ref 98–111)
Creatinine, Ser: 0.93 mg/dL (ref 0.44–1.00)
GFR, Estimated: >60 mL/min (ref >60)
Glucose, Bld: 177 mg/dL — ABNORMAL HIGH (ref 70–99)
Potassium: 4 mmol/L (ref 3.5–5.1)
Sodium: 139 mmol/L (ref 135–145)
Total Bilirubin: 0.3 mg/dL (ref 0.3–1.2)
Total Protein: 7.1 g/dL (ref 6.5–8.1)

## 2022-02-27 MED ORDER — HEPARIN SOD (PORK) LOCK FLUSH 100 UNIT/ML IV SOLN
500.0000 [IU] | Freq: Once | INTRAVENOUS | Status: AC | PRN
  Administered 2022-02-27: 500 [IU]

## 2022-02-27 MED ORDER — SODIUM CHLORIDE 0.9 % IV SOLN
420.0000 mg | Freq: Once | INTRAVENOUS | Status: AC
  Administered 2022-02-27: 420 mg via INTRAVENOUS
  Filled 2022-02-27: qty 14

## 2022-02-27 MED ORDER — ACETAMINOPHEN 325 MG PO TABS
650.0000 mg | ORAL_TABLET | Freq: Once | ORAL | Status: AC
  Administered 2022-02-27: 650 mg via ORAL
  Filled 2022-02-27: qty 2

## 2022-02-27 MED ORDER — SODIUM CHLORIDE 0.9 % IV SOLN
150.0000 mg | Freq: Once | INTRAVENOUS | Status: AC
  Administered 2022-02-27: 150 mg via INTRAVENOUS
  Filled 2022-02-27: qty 150

## 2022-02-27 MED ORDER — PALONOSETRON HCL INJECTION 0.25 MG/5ML
0.2500 mg | Freq: Once | INTRAVENOUS | Status: AC
  Administered 2022-02-27: 0.25 mg via INTRAVENOUS
  Filled 2022-02-27: qty 5

## 2022-02-27 MED ORDER — SODIUM CHLORIDE 0.9% FLUSH
10.0000 mL | INTRAVENOUS | Status: DC | PRN
  Administered 2022-02-27: 10 mL via INTRAVENOUS

## 2022-02-27 MED ORDER — SODIUM CHLORIDE 0.9% FLUSH
10.0000 mL | INTRAVENOUS | Status: DC | PRN
  Administered 2022-02-27: 10 mL

## 2022-02-27 MED ORDER — SODIUM CHLORIDE 0.9 % IV SOLN
700.0000 mg | Freq: Once | INTRAVENOUS | Status: AC
  Administered 2022-02-27: 700 mg via INTRAVENOUS
  Filled 2022-02-27: qty 70

## 2022-02-27 MED ORDER — SODIUM CHLORIDE 0.9 % IV SOLN
75.0000 mg/m2 | Freq: Once | INTRAVENOUS | Status: AC
  Administered 2022-02-27: 150 mg via INTRAVENOUS
  Filled 2022-02-27: qty 15

## 2022-02-27 MED ORDER — SODIUM CHLORIDE 0.9 % IV SOLN
10.0000 mg | Freq: Once | INTRAVENOUS | Status: AC
  Administered 2022-02-27: 10 mg via INTRAVENOUS
  Filled 2022-02-27: qty 10

## 2022-02-27 MED ORDER — SODIUM CHLORIDE 0.9 % IV SOLN: Freq: Once | INTRAVENOUS | Status: AC

## 2022-02-27 MED ORDER — TRASTUZUMAB-DKST CHEMO 150 MG IV SOLR
6.0000 mg/kg | Freq: Once | INTRAVENOUS | Status: AC
  Administered 2022-02-27: 546 mg via INTRAVENOUS
  Filled 2022-02-27: qty 26

## 2022-02-27 MED ORDER — DIPHENHYDRAMINE HCL 25 MG PO CAPS
50.0000 mg | ORAL_CAPSULE | Freq: Once | ORAL | Status: AC
  Administered 2022-02-27: 50 mg via ORAL
  Filled 2022-02-27: qty 2

## 2022-02-27 NOTE — Progress Notes (Addendum)
[facility] Follow up:    Pcp, No No address on file   DIAGNOSIS:  Cancer Staging  Malignant neoplasm of upper-outer quadrant of right breast in female, estrogen receptor positive ([facility]) Staging form: Breast, AJCC 8th Edition - Clinical stage from 01/02/2022: Stage IB (cT2, cN0, cM0, G3, ER+, PR+, HER2+) - Signed by Benay Pike, MD on 01/02/2022 Histologic grading system: 3 grade system   SUMMARY OF ONCOLOGIC HISTORY: 1) 12/11/2021: Diagnostic mammogram and ultrasound showed suspicious mass in the 9 o'clock region of the right breast. No evidence of enlarged adenopathy.   2) 12/25/2021: Underwent US guided right breast core needle biopsy of breast mass at 9 o'clock. Pathology showed invasive ductal carcinoma grade 3, ductal carcinoma in situ and calcifications, 5 cm from the nipple.  Prognostics from this mass showed ER 70% moderate, PR 20% moderate, HER2 positive by IHC, Ki-67 of 30%.   3) 01/09/2022:  MR Breast Bilateral: Biopsy proven malignancy in the outer right breast with associated linear/nodular enhancement extending anteriorly altogether measuring up to 5.1 cm. Indeterminate 0.6 cm tubular enhancing mass in the outer left breast.   4) 01/16/2022: Initiated neoadjuvant therapy with TCHP q 21 days given T2 tumor with HER2 amplification.    5) 01/17/2022: Underwent MRI guided biopsy of indeterminate 6 mm enhancing mass over the left outer lower quadrant. 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 No evidence of malignancy.     CURRENT THERAPY: TCHP cycle 3  INTERVAL HISTORY: Tonya Blair 46 y.o. female returns for evaluation prior to receiving her third cycle of neoadjuvant chemotherapy with TCHP.  She notes that she is tolerating this well.  She denies any peripheral neuropathy.  She is not experiencing any nausea or vomiting.  She is not having any diarrhea.  She is experiencing some fatigue that is worse with day 8 after  receiving her treatment.  She notes on those days she will have her daughter who is 72 and lives with her walk her dog.  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  She has no new concerns today and is tolerating treatment well.  She has never been able to fill her breast cancer and neither has our oncology team.  She does not notice any new breast concerns today.   Patient Active Problem List   Diagnosis Date Noted   Genetic testing 01/18/2022   Family history of prostate cancer 01/02/2022   Malignant neoplasm of upper-outer quadrant of right breast in female, estrogen receptor positive ([facility]) 12/31/2021   Cervical intraepithelial neoplasia grade 2 10/28/2019    is allergic to codeine.  MEDICAL HISTORY: Past Medical History:  Diagnosis Date   Anemia    IDA   Cancer ([place])    right breast cancer   Family history of prostate cancer 01/02/2022    SURGICAL HISTORY: Past Surgical History:  Procedure Laterality Date   BREAST BIOPSY Right 12/25/2021   PORTACATH PLACEMENT N/A 01/14/2022   Procedure: INSERTION PORT-A-CATH;  Surgeon: Rolm Bookbinder, MD;  Location: [facility];  Service: General;  Laterality: N/A;   THERAPEUTIC ABORTION     WISDOM TOOTH EXTRACTION      SOCIAL HISTORY: Social History   Socioeconomic History   Marital status: Single    Spouse name: Not on file   Number of children: Not on file   Years of education: Not on file  Highest education level: Not on file  Occupational History   Not on file  Tobacco Use   Smoking status: Never   Smokeless tobacco: Never  Vaping Use   Vaping Use: Never used  Substance and Sexual Activity   Alcohol use: Yes    Comment: occasional wine   Drug use: Never   Sexual activity: Yes    Birth control/protection: I.U.D.    Comment: Mirena IUD  Other Topics Concern   Not on file  Social History Narrative    Not on file   Social Determinants of Health   Financial Resource Strain: Low Risk    Difficulty of Paying Living Expenses: Not hard at all  Food Insecurity: No Food Insecurity   Worried About Charity fundraiser in the Last Year: Never true   Arboriculturist in the Last Year: Never true  Transportation Needs: No Transportation Needs   Lack of Transportation (Medical): No   Lack of Transportation (Non-Medical): No  Physical Activity: Not on file  Stress: Not on file  Social Connections: Not on file  Intimate Partner Violence: Not on file    FAMILY HISTORY: Family History  Problem Relation Age of Onset   Prostate cancer Father 22    Review of Systems  Constitutional:  Positive for fatigue. Negative for appetite change, chills, fever and unexpected weight change.  HENT:   Negative for hearing loss, lump/mass and trouble swallowing.   Eyes:  Negative for eye problems and icterus.  Respiratory:  Negative for chest tightness, cough and shortness of breath.   Cardiovascular:  Negative for chest pain, leg swelling and palpitations.  Gastrointestinal:  Negative for abdominal distention, abdominal pain, constipation, diarrhea, nausea and vomiting.  Endocrine: Negative for hot flashes.  Genitourinary:  Negative for difficulty urinating.   Musculoskeletal:  Negative for arthralgias.  Skin:  Negative for itching and rash.  Neurological:  Negative for dizziness, extremity weakness, headaches and numbness.  Hematological:  Negative for adenopathy. Does not bruise/bleed easily.  Psychiatric/Behavioral:  Negative for depression. The patient is not nervous/anxious.      PHYSICAL EXAMINATION  ECOG PERFORMANCE STATUS: 1 - Symptomatic but completely ambulatory  Vitals:   02/27/22 0833  BP: 128/75  Pulse: 96  Resp: 18  Temp: 97.7 F (36.5 C)  SpO2: 100%    Physical Exam Constitutional:      General: She is not in acute distress.    Appearance: Normal appearance. She is not  toxic-appearing.  HENT:     Head: Normocephalic and atraumatic.  Eyes:     General: No scleral icterus. Cardiovascular:     Rate and Rhythm: Normal rate and regular rhythm.     Pulses: Normal pulses.     Heart sounds: Normal heart sounds.  Pulmonary:     Effort: Pulmonary effort is normal.     Breath sounds: Normal breath sounds.  Abdominal:     General: Abdomen is flat. Bowel sounds are normal. There is no distension.     Palpations: Abdomen is soft.     Tenderness: There is no abdominal tenderness.  Musculoskeletal:        General: No swelling.     Cervical back: Neck supple.  Lymphadenopathy:     Cervical: No cervical adenopathy.  Skin:    General: Skin is warm and dry.     Findings: No rash.  Neurological:     General: No focal deficit present.     Mental Status: She is alert.  Psychiatric:  Mood and Affect: Mood normal.        Behavior: Behavior normal.    LABORATORY DATA:  CBC    Component Value Date/Time   WBC 11.4 (H) 02/27/2022 0819   RBC 3.71 (L) 02/27/2022 0819   HGB 9.6 (L) 02/27/2022 0819   HGB 12.5 01/02/2022 0833   HCT 30.3 (L) 02/27/2022 0819   PLT 315 02/27/2022 0819   PLT 304 01/02/2022 0833   MCV 81.7 02/27/2022 0819   MCH 25.9 (L) 02/27/2022 0819   MCHC 31.7 02/27/2022 0819   RDW 17.6 (H) 02/27/2022 0819   LYMPHSABS 2.0 02/27/2022 0819   MONOABS 0.6 02/27/2022 0819   EOSABS 0.0 02/27/2022 0819   BASOSABS 0.0 02/27/2022 0819    CMP     Component Value Date/Time   NA 139 02/27/2022 0819   K 4.0 02/27/2022 0819   CL 105 02/27/2022 0819   CO2 25 02/27/2022 0819   GLUCOSE 177 (H) 02/27/2022 0819   BUN 18 02/27/2022 0819   CREATININE 0.93 02/27/2022 0819   CREATININE 1.07 (H) 01/02/2022 0833   CALCIUM 9.8 02/27/2022 0819   PROT 7.1 02/27/2022 0819   ALBUMIN 4.4 02/27/2022 0819   AST 13 (L) 02/27/2022 0819   AST 15 01/02/2022 0833   ALT 19 02/27/2022 0819   ALT 15 01/02/2022 0833   ALKPHOS 76 02/27/2022 0819   BILITOT 0.3  02/27/2022 0819   BILITOT 0.5 01/02/2022 0833   GFRNONAA >60 02/27/2022 0819   GFRNONAA >60 01/02/2022 0833      ASSESSMENT and THERAPY PLAN:   Malignant neoplasm of upper-outer quadrant of right breast in female, estrogen receptor positive ([place]) Tonya Blair is a 46 year old woman who is here today with stage Ib triple positive breast cancer currently undergoing treatment with neoadjuvant chemotherapy with TCHP.  1.  Stage Ib triple positive breast cancer: She continues on treatment with TCHP.  Her most recent echo was normal.  This was completed in January, therefore I have ordered repeat in April.  2.  Fatigue: She is managing this with activity conservation on her lowest days.  She does have help with her daughter and seems to be active outside of those days.  I encouraged her to continue with her regular activities and energy conservation as she has been.  Tonya Blair return in 3 weeks for labs, follow-up, cycle 4 of TCHP.   All questions were answered. The patient knows to call the clinic with any problems, questions or concerns. We can certainly see the patient much sooner if necessary.  Total encounter time: 20 minutes in face-to-face visit time, chart review, lab review, care coordination, order entry, and documentation of the encounter.  Wilber Bihari, NP 02/27/22 9:22 AM Medical Oncology and Hematology [facility] [place], [place] [address] Tel. [phone]    Fax. [phone]  *Total Encounter Time as defined by the Centers for Medicare and Medicaid Services includes, in addition to the face-to-face time of a patient visit (documented in the note above) non-face-to-face time: obtaining and reviewing outside history, ordering and reviewing medications, tests or procedures, care coordination (communications with other health care professionals or caregivers) and documentation in the medical record.

## 2022-02-27 NOTE — Patient Instructions (Signed)
[facility]  Discharge Instructions: ?Thank you for choosing [facility] to provide your oncology and hematology care.  ? ?If you have a lab appointment with the [facility], please go directly to the [facility] and check in at the registration area. ?  ?Wear comfortable clothing and clothing appropriate for easy access to any Portacath or PICC line.  ? ?We strive to give you quality time with your provider. You may need to reschedule your appointment if you arrive late (15 or more minutes).  Arriving late affects you and other patients whose appointments are after yours.  Also, if you miss three or more appointments without notifying the office, you may be dismissed from the clinic at the provider?s discretion.    ?  ?For prescription refill requests, have your pharmacy contact our office and allow 72 hours for refills to be completed.   ? ?Today you received the following chemotherapy and/or immunotherapy agents: Ogivri/Perjeta/Docetaxel/Carboplatin   ?  ?To help prevent nausea and vomiting after your treatment, we encourage you to take your nausea medication as directed. ? ?BELOW ARE SYMPTOMS THAT SHOULD BE REPORTED IMMEDIATELY: ?*FEVER GREATER THAN 100.4 F (38 ?C) OR HIGHER ?*CHILLS OR SWEATING ?*NAUSEA AND VOMITING THAT IS NOT CONTROLLED WITH YOUR NAUSEA MEDICATION ?*UNUSUAL SHORTNESS OF BREATH ?*UNUSUAL BRUISING OR BLEEDING ?*URINARY PROBLEMS (pain or burning when urinating, or frequent urination) ?*BOWEL PROBLEMS (unusual diarrhea, constipation, pain near the anus) ?TENDERNESS IN MOUTH AND THROAT WITH OR WITHOUT PRESENCE OF ULCERS (sore throat, sores in mouth, or a toothache) ?UNUSUAL RASH, SWELLING OR PAIN  ?UNUSUAL VAGINAL DISCHARGE OR ITCHING  ? ?Items with * indicate a potential emergency and should be followed up as soon as possible or go to the Emergency Department if any problems should occur. ? ?Please show the CHEMOTHERAPY ALERT CARD or IMMUNOTHERAPY  ALERT CARD at check-in to the Emergency Department and triage nurse. ? ?Should you have questions after your visit or need to cancel or reschedule your appointment, please contact [facility]  Dept: [phone]  and follow the prompts.  Office hours are 8:00 a.m. to 4:30 p.m. Monday - Friday. Please note that voicemails left after 4:00 p.m. may not be returned until the following business day.  We are closed weekends and major holidays. You have access to a nurse at all times for urgent questions. Please call the main number to the clinic Dept: [phone] and follow the prompts. ? ? ?For any non-urgent questions, you may also contact your provider using MyChart. We now offer e-Visits for anyone 2 and older to request care online for non-urgent symptoms. For details visit [email or web address]. ?  ?Also download the MyChart app! Go to the app store, search "MyChart", open the app, select [facility], and log in with your MyChart username and password. ? ?Due to Covid, a mask is required upon entering the hospital/clinic. If you do not have a mask, one will be given to you upon arrival. For doctor visits, patients may have 1 support person aged 51 or older with them. For treatment visits, patients cannot have anyone with them due to current Covid guidelines and our immunocompromised population.  ? ?

## 2022-02-27 NOTE — Assessment & Plan Note (Signed)
Tonya Blair is a 46 year old woman who is here today with stage Ib triple positive breast cancer currently undergoing treatment with neoadjuvant chemotherapy with TCHP. ? ?1.  Stage Ib triple positive breast cancer: She continues on treatment with TCHP.  Her most recent echo was normal.  This was completed in January, therefore I have ordered repeat in April. ? ?2.  Fatigue: She is managing this with activity conservation on her lowest days.  She does have help with her daughter and seems to be active outside of those days.  I encouraged her to continue with her regular activities and energy conservation as she has been. ? ?Tonya Blair return in 3 weeks for labs, follow-up, cycle 4 of TCHP. ?

## 2022-03-01 ENCOUNTER — Inpatient Hospital Stay: Payer: 59

## 2022-03-01 ENCOUNTER — Other Ambulatory Visit: Payer: Self-pay

## 2022-03-01 VITALS — BP 119/68 | HR 77 | Temp 98.8°F | Resp 18

## 2022-03-01 DIAGNOSIS — C50411 Malignant neoplasm of upper-outer quadrant of right female breast: Secondary | ICD-10-CM

## 2022-03-01 DIAGNOSIS — Z5111 Encounter for antineoplastic chemotherapy: Secondary | ICD-10-CM | POA: Diagnosis not present

## 2022-03-01 MED ORDER — PEGFILGRASTIM-BMEZ 6 MG/0.6ML ~~LOC~~ SOSY
6.0000 mg | PREFILLED_SYRINGE | Freq: Once | SUBCUTANEOUS | Status: AC
  Administered 2022-03-01: 6 mg via SUBCUTANEOUS
  Filled 2022-03-01: qty 0.6

## 2022-03-19 ENCOUNTER — Inpatient Hospital Stay: Payer: 59

## 2022-03-19 ENCOUNTER — Encounter: Payer: Self-pay | Admitting: Hematology and Oncology

## 2022-03-19 ENCOUNTER — Encounter: Payer: Self-pay | Admitting: *Deleted

## 2022-03-19 ENCOUNTER — Inpatient Hospital Stay (HOSPITAL_BASED_OUTPATIENT_CLINIC_OR_DEPARTMENT_OTHER): Payer: 59 | Admitting: Hematology and Oncology

## 2022-03-19 ENCOUNTER — Other Ambulatory Visit: Payer: Self-pay

## 2022-03-19 DIAGNOSIS — C50411 Malignant neoplasm of upper-outer quadrant of right female breast: Secondary | ICD-10-CM

## 2022-03-19 DIAGNOSIS — T451X5A Adverse effect of antineoplastic and immunosuppressive drugs, initial encounter: Secondary | ICD-10-CM

## 2022-03-19 DIAGNOSIS — K521 Toxic gastroenteritis and colitis: Secondary | ICD-10-CM | POA: Insufficient documentation

## 2022-03-19 DIAGNOSIS — Z170 Estrogen receptor positive status [ER+]: Secondary | ICD-10-CM | POA: Diagnosis not present

## 2022-03-19 DIAGNOSIS — Z95828 Presence of other vascular implants and grafts: Secondary | ICD-10-CM

## 2022-03-19 DIAGNOSIS — Z5111 Encounter for antineoplastic chemotherapy: Secondary | ICD-10-CM | POA: Diagnosis not present

## 2022-03-19 LAB — COMPREHENSIVE METABOLIC PANEL
ALT: 17 U/L (ref 0–44)
AST: 15 U/L (ref 15–41)
Albumin: 3.7 g/dL (ref 3.5–5.0)
Alkaline Phosphatase: 64 U/L (ref 38–126)
Anion gap: 5 (ref 5–15)
BUN: 16 mg/dL (ref 6–20)
CO2: 27 mmol/L (ref 22–32)
Calcium: 9 mg/dL (ref 8.9–10.3)
Chloride: 108 mmol/L (ref 98–111)
Creatinine, Ser: 0.9 mg/dL (ref 0.44–1.00)
GFR, Estimated: >60 mL/min (ref >60)
Glucose, Bld: 123 mg/dL — ABNORMAL HIGH (ref 70–99)
Potassium: 4 mmol/L (ref 3.5–5.1)
Sodium: 140 mmol/L (ref 135–145)
Total Bilirubin: 0.3 mg/dL (ref 0.3–1.2)
Total Protein: 6.3 g/dL — ABNORMAL LOW (ref 6.5–8.1)

## 2022-03-19 LAB — CBC WITH DIFFERENTIAL/PLATELET
Abs Immature Granulocytes: 0.04 10*3/uL (ref 0.00–0.07)
Basophils Absolute: 0 10*3/uL (ref 0.0–0.1)
Basophils Relative: 0 %
Eosinophils Absolute: 0 10*3/uL (ref 0.0–0.5)
Eosinophils Relative: 0 %
HCT: 27.2 % — ABNORMAL LOW (ref 36.0–46.0)
Hemoglobin: 8.6 g/dL — ABNORMAL LOW (ref 12.0–15.0)
Immature Granulocytes: 1 %
Lymphocytes Relative: 37 %
Lymphs Abs: 2.1 10*3/uL (ref 0.7–4.0)
MCH: 26.5 pg (ref 26.0–34.0)
MCHC: 31.6 g/dL (ref 30.0–36.0)
MCV: 83.7 fL (ref 80.0–100.0)
Monocytes Absolute: 0.8 10*3/uL (ref 0.1–1.0)
Monocytes Relative: 13 %
Neutro Abs: 2.9 10*3/uL (ref 1.7–7.7)
Neutrophils Relative %: 49 %
Platelets: 278 10*3/uL (ref 150–400)
RBC: 3.25 MIL/uL — ABNORMAL LOW (ref 3.87–5.11)
RDW: 19.5 % — ABNORMAL HIGH (ref 11.5–15.5)
WBC: 5.8 10*3/uL (ref 4.0–10.5)
nRBC: 0.5 % — ABNORMAL HIGH (ref 0.0–0.2)

## 2022-03-19 LAB — FERRITIN: Ferritin: 775 ng/mL — ABNORMAL HIGH (ref 11–307)

## 2022-03-19 MED ORDER — HEPARIN SOD (PORK) LOCK FLUSH 100 UNIT/ML IV SOLN
500.0000 [IU] | Freq: Once | INTRAVENOUS | Status: AC
  Administered 2022-03-19: 500 [IU] via INTRAVENOUS

## 2022-03-19 MED ORDER — SODIUM CHLORIDE 0.9% FLUSH
10.0000 mL | INTRAVENOUS | Status: DC | PRN
  Administered 2022-03-19: 10 mL via INTRAVENOUS

## 2022-03-19 NOTE — Progress Notes (Signed)
[facility] ? ?PROGRESS NOTE ? ?Patient Care Team: ?Pcp, No as PCP - General ?Mauro Kaufmann, RN as Oncology Nurse Navigator ?Rockwell Germany, RN as Oncology Nurse Navigator ?Rolm Bookbinder, MD as Consulting Physician (General Surgery) ?Benay Pike, MD as Consulting Physician (Hematology and Oncology) ?Eppie Gibson, MD as Attending Physician (Radiation Oncology) ? ?CHIEF COMPLAINTS:  ?Right breast invasive ductal carcinoma. ? ?Oncology History  ?Malignant neoplasm of upper-outer quadrant of right breast in female, estrogen receptor positive ([facility])  ?12/31/2021 Initial Diagnosis  ? Malignant neoplasm of upper-outer quadrant of right breast in female, estrogen receptor positive ([facility]) ?  ?01/02/2022 Cancer Staging  ? Staging form: Breast, AJCC 8th Edition ?- Clinical stage from 01/02/2022: Stage IB (cT2, cN0, cM0, G3, ER+, PR+, HER2+) - Signed by Benay Pike, MD on 01/02/2022 ?Histologic grading system: 3 grade system ? ?  ?01/14/2022 Genetic Testing  ? Negative hereditary cancer genetic testing: no pathogenic variants detected in [facility] CustomNext-Cancer +RNAinsight Panel.  The report date is January 14, 2022. ? ?The CustomNext-Cancer+RNAinsight panel offered by [facility] includes sequencing and rearrangement analysis for the following 47 genes:  APC, ATM, AXIN2, BARD1, BMPR1A, BRCA1, BRCA2, BRIP1, CDH1, CDK4, CDKN2A, CHEK2, DICER1, EPCAM, GREM1, HOXB13, MEN1, MLH1, MSH2, MSH3, MSH6, MUTYH, NBN, NF1, NF2, NTHL1, PALB2, PMS2, POLD1, POLE, PTEN, RAD51C, RAD51D, RECQL, RET, SDHA, SDHAF2, SDHB, SDHC, SDHD, SMAD4, SMARCA4, STK11, TP53, TSC1, TSC2, and VHL.  RNA data is routinely analyzed for use in variant interpretation for all genes. ?  ?01/16/2022 -  Chemotherapy  ? Patient is on Treatment Plan : BREAST  Docetaxel + Carboplatin + Trastuzumab + Pertuzumab  (TCHP) q21d   ?   ? ? ?ONCOLOGIC HISTORY: ?1) 12/11/2021: Diagnostic mammogram and ultrasound showed suspicious mass in the 9 o'clock region  of the right breast. No evidence of enlarged adenopathy. ? ?2) 12/25/2021: Underwent US guided right breast core needle biopsy of breast mass at 9 o'clock. Pathology showed invasive ductal carcinoma grade 3, ductal carcinoma in situ and calcifications, 5 cm from the nipple.  Prognostics from this mass showed ER 70% moderate, PR 20% moderate, HER2 positive by IHC, Ki-67 of 30%. ? ?3) 01/09/2022:  MR Breast Bilateral: Biopsy proven malignancy in the outer right breast with ?associated linear/nodular enhancement extending anteriorly altogether measuring up to 5.1 cm. Indeterminate 0.6 cm tubular enhancing mass in the outer left breast. ? ?4) 01/16/2022: Initiated neoadjuvant therapy with TCHP q 21 days given T2 tumor with HER2 amplification.  ? ?5) 01/17/2022: Underwent MRI guided biopsy of indeterminate 6 mm enhancing mass over the left outer lower quadrant. 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 No evidence of malignancy.  ? ? ?HISTORY OF PRESENTING ILLNESS:  ? ?Tonya Blair 46 y.o. female is here for a toxicity evaluation after C 3 neoadjuvant TCHP therapy on 01/16/2022. ?Patient is here for follow-up before cycle 4 of TCHP.  ?She is doing remarkably well, tolerating chemo very well so far.  For the first week after chemotherapy, she has about 3 bowel movements every day, loose and watery but she does not have to take Imodium although she has some.  She denies any nausea or vomiting.  Her appetite is good.  Urine is clear.  No fevers or chills.  No neuropathy.  She has however noticed that her nail beds have become tender and discolored.  She has not been checking her breast mass and she cannot comment on changes. ? ?Rest of the pertinent 10 point ROS reviewed and  negative. ? ?MEDICAL HISTORY:  ?Past Medical History:  ?Diagnosis Date  ? Anemia   ? IDA  ? Cancer [facility])   ? right breast cancer  ? Family history of prostate cancer 01/02/2022  ? ? ?SURGICAL HISTORY: ?Past Surgical History:   ?Procedure Laterality Date  ? BREAST BIOPSY Right 12/25/2021  ? PORTACATH PLACEMENT N/A 01/14/2022  ? Procedure: INSERTION PORT-A-CATH;  Surgeon: Rolm Bookbinder, MD;  Location: [facility];  Service: General;  Laterality: N/A;  ? THERAPEUTIC ABORTION    ? WISDOM TOOTH EXTRACTION    ? ? ?SOCIAL HISTORY: ?Social History  ? ?Socioeconomic History  ? Marital status: Single  ?  Spouse name: Not on file  ? Number of children: Not on file  ? Years of education: Not on file  ? Highest education level: Not on file  ?Occupational History  ? Not on file  ?Tobacco Use  ? Smoking status: Never  ? Smokeless tobacco: Never  ?Vaping Use  ? Vaping Use: Never used  ?Substance and Sexual Activity  ? Alcohol use: Yes  ?  Comment: occasional wine  ? Drug use: Never  ? Sexual activity: Yes  ?  Birth control/protection: I.U.D.  ?  Comment: Mirena IUD  ?Other Topics Concern  ? Not on file  ?Social History Narrative  ? Not on file  ? ?Social Determinants of Health  ? ?Financial Resource Strain: Low Risk   ? Difficulty of Paying Living Expenses: Not hard at all  ?Food Insecurity: No Food Insecurity  ? Worried About Charity fundraiser in the Last Year: Never true  ? Ran Out of Food in the Last Year: Never true  ?Transportation Needs: No Transportation Needs  ? Lack of Transportation (Medical): No  ? Lack of Transportation (Non-Medical): No  ?Physical Activity: Not on file  ?Stress: Not on file  ?Social Connections: Not on file  ?Intimate Partner Violence: Not on file  ? ? ?FAMILY HISTORY: ?Family History  ?Problem Relation Age of Onset  ? Prostate cancer Father 72  ? ? ?ALLERGIES:  is allergic to codeine. ? ?MEDICATIONS:  ?Current Outpatient Medications  ?Medication Sig Dispense Refill  ? Ascorbic Acid (VITAMIN C PO) Take 1 tablet by mouth in the morning.    ? BIOTIN PO Take 1 tablet by mouth in the morning.    ? Cholecalciferol (VITAMIN D3 PO) Take 1 tablet by mouth in the morning.    ? COD LIVER OIL PO Take by mouth.    ? dexamethasone  (DECADRON) 4 MG tablet Take 2 tablets (8 mg total) by mouth 2 (two) times daily. Start the day before Taxotere. Then take daily x 3 days after chemotherapy. 30 tablet 1  ? FERROUS SULFATE PO Take 1 tablet by mouth in the morning.    ? ibuprofen (ADVIL) 200 MG tablet Take 200 mg by mouth every 8 (eight) hours as needed (pain.).    ? levonorgestrel (MIRENA, 52 MG,) 20 MCG/DAY IUD Mirena 20 mcg/24 hours (8 yrs) 52 mg intrauterine device    ? lidocaine-prilocaine (EMLA) cream Apply to affected area once 30 g 3  ? LORazepam (ATIVAN) 0.5 MG tablet Take 1 tablet (0.5 mg total) by mouth every 6 (six) hours as needed (Nausea or vomiting). 30 tablet 0  ? Multiple Minerals-Vitamins (CAL MAG ZINC +D3 PO) Take 1 tablet by mouth in the morning.    ? ondansetron (ZOFRAN) 8 MG tablet Take 1 tablet (8 mg total) by mouth 2 (two) times daily as needed (Nausea  or vomiting). Start on the third day after chemotherapy. 30 tablet 1  ? OVER THE COUNTER MEDICATION Take 1 capsule by mouth in the morning. [place] Superfood Capsule    ? prochlorperazine (COMPAZINE) 10 MG tablet Take 1 tablet (10 mg total) by mouth every 6 (six) hours as needed (Nausea or vomiting). 30 tablet 1  ? traMADol (ULTRAM) 50 MG tablet Take 2 tablets (100 mg total) by mouth every 6 (six) hours as needed. 10 tablet 0  ? ?No current facility-administered medications for this visit.  ? ?Facility-Administered Medications Ordered in Other Visits  ?Medication Dose Route Frequency Provider Last Rate Last Admin  ? sodium chloride flush (NS) 0.9 % injection 10 mL  10 mL Intravenous PRN Benay Pike, MD   10 mL at 03/19/22 0413  ? ? ?PHYSICAL EXAMINATION: ?ECOG PERFORMANCE STATUS: 1 - Symptomatic but completely ambulatory ? ?Vitals:  ? 03/19/22 0838  ?BP: 113/69  ?Pulse: 90  ?Resp: 16  ?Temp: 97.9 ?F (36.6 ?C)  ?SpO2: 100%  ? ? ? ?Filed Weights  ? 03/19/22 0838  ?Weight: 193 lb 14.4 oz (88 kg)  ? ? ?GENERAL:alert, no distress and comfortable ?SKIN: tender nail beds, no skin  rash   ?Palpable right breast mass at 9 o'clock position measuring just under a cm on physical exam, not well defined anymore consistent with response. NO regional adenopathy. ?BLE: NO LE edema. ? ?LABORATORY

## 2022-03-19 NOTE — Assessment & Plan Note (Signed)
?#  Right breast invasive ductal carcinoma ?--Biopsy proved on 12/25/2021. Pathology showed invasive ductal carcinoma grade 3, ductal carcinoma in situ and calcifications, 5 cm from the nipple.  Prognostics from this mass showed ER 70% moderate, PR 20% moderate, HER2 positive by IHC, Ki-67 of 30%. ?--Recommendation is neoadjuvant TCHP q 21 days given T2 tumor with HER2 amplification. ?-- She has completed 3 cycles so far and has tolerated it remarkably well.   ?--- Okay to proceed with cycle 4 as planned today if labs are within parameters.  Clinical exam with a significant decrease in size of the right breast mass, previously measured about 2 cm and now definitely under a centimeter and a barely palpable.   ?Return to clinic in 3 weeks before cycle 5-day 1. ?

## 2022-03-19 NOTE — Assessment & Plan Note (Signed)
Grade 1 for about a week after chemotherapy cycle. ?Use Imodium as needed. ?No need for dose reduction at this time ?

## 2022-03-20 ENCOUNTER — Inpatient Hospital Stay: Payer: 59

## 2022-03-20 VITALS — BP 114/64 | HR 100 | Temp 98.7°F | Resp 18

## 2022-03-20 DIAGNOSIS — Z5111 Encounter for antineoplastic chemotherapy: Secondary | ICD-10-CM | POA: Diagnosis not present

## 2022-03-20 DIAGNOSIS — Z170 Estrogen receptor positive status [ER+]: Secondary | ICD-10-CM

## 2022-03-20 MED ORDER — SODIUM CHLORIDE 0.9 % IV SOLN
10.0000 mg | Freq: Once | INTRAVENOUS | Status: AC
  Administered 2022-03-20: 10 mg via INTRAVENOUS
  Filled 2022-03-20: qty 10

## 2022-03-20 MED ORDER — ACETAMINOPHEN 325 MG PO TABS
650.0000 mg | ORAL_TABLET | Freq: Once | ORAL | Status: AC
  Administered 2022-03-20: 650 mg via ORAL
  Filled 2022-03-20: qty 2

## 2022-03-20 MED ORDER — HEPARIN SOD (PORK) LOCK FLUSH 100 UNIT/ML IV SOLN
500.0000 [IU] | Freq: Once | INTRAVENOUS | Status: AC | PRN
  Administered 2022-03-20: 500 [IU]

## 2022-03-20 MED ORDER — TRASTUZUMAB-DKST CHEMO 150 MG IV SOLR
6.0000 mg/kg | Freq: Once | INTRAVENOUS | Status: AC
  Administered 2022-03-20: 546 mg via INTRAVENOUS
  Filled 2022-03-20: qty 26

## 2022-03-20 MED ORDER — SODIUM CHLORIDE 0.9 % IV SOLN
75.0000 mg/m2 | Freq: Once | INTRAVENOUS | Status: AC
  Administered 2022-03-20: 150 mg via INTRAVENOUS
  Filled 2022-03-20: qty 15

## 2022-03-20 MED ORDER — SODIUM CHLORIDE 0.9 % IV SOLN
700.0000 mg | Freq: Once | INTRAVENOUS | Status: AC
  Administered 2022-03-20: 700 mg via INTRAVENOUS
  Filled 2022-03-20: qty 70

## 2022-03-20 MED ORDER — SODIUM CHLORIDE 0.9% FLUSH
10.0000 mL | INTRAVENOUS | Status: DC | PRN
  Administered 2022-03-20: 10 mL

## 2022-03-20 MED ORDER — SODIUM CHLORIDE 0.9 % IV SOLN: Freq: Once | INTRAVENOUS | Status: AC

## 2022-03-20 MED ORDER — DIPHENHYDRAMINE HCL 25 MG PO CAPS
50.0000 mg | ORAL_CAPSULE | Freq: Once | ORAL | Status: AC
  Administered 2022-03-20: 50 mg via ORAL
  Filled 2022-03-20: qty 2

## 2022-03-20 MED ORDER — PALONOSETRON HCL INJECTION 0.25 MG/5ML
0.2500 mg | Freq: Once | INTRAVENOUS | Status: AC
  Administered 2022-03-20: 0.25 mg via INTRAVENOUS
  Filled 2022-03-20: qty 5

## 2022-03-20 MED ORDER — SODIUM CHLORIDE 0.9 % IV SOLN
150.0000 mg | Freq: Once | INTRAVENOUS | Status: AC
  Administered 2022-03-20: 150 mg via INTRAVENOUS
  Filled 2022-03-20: qty 150

## 2022-03-20 MED ORDER — SODIUM CHLORIDE 0.9 % IV SOLN
420.0000 mg | Freq: Once | INTRAVENOUS | Status: AC
  Administered 2022-03-20: 420 mg via INTRAVENOUS
  Filled 2022-03-20: qty 14

## 2022-03-20 NOTE — Patient Instructions (Signed)
[facility]  Discharge Instructions: ?Thank you for choosing [facility] to provide your oncology and hematology care.  ? ?If you have a lab appointment with the [facility], please go directly to the [facility] and check in at the registration area. ?  ?Wear comfortable clothing and clothing appropriate for easy access to any Portacath or PICC line.  ? ?We strive to give you quality time with your provider. You may need to reschedule your appointment if you arrive late (15 or more minutes).  Arriving late affects you and other patients whose appointments are after yours.  Also, if you miss three or more appointments without notifying the office, you may be dismissed from the clinic at the provider?s discretion.    ?  ?For prescription refill requests, have your pharmacy contact our office and allow 72 hours for refills to be completed.   ? ?Today you received the following chemotherapy and/or immunotherapy agents: Ogivri/Perjeta/Docetaxel/Carboplatin   ?  ?To help prevent nausea and vomiting after your treatment, we encourage you to take your nausea medication as directed. ? ?BELOW ARE SYMPTOMS THAT SHOULD BE REPORTED IMMEDIATELY: ?*FEVER GREATER THAN 100.4 F (38 ?C) OR HIGHER ?*CHILLS OR SWEATING ?*NAUSEA AND VOMITING THAT IS NOT CONTROLLED WITH YOUR NAUSEA MEDICATION ?*UNUSUAL SHORTNESS OF BREATH ?*UNUSUAL BRUISING OR BLEEDING ?*URINARY PROBLEMS (pain or burning when urinating, or frequent urination) ?*BOWEL PROBLEMS (unusual diarrhea, constipation, pain near the anus) ?TENDERNESS IN MOUTH AND THROAT WITH OR WITHOUT PRESENCE OF ULCERS (sore throat, sores in mouth, or a toothache) ?UNUSUAL RASH, SWELLING OR PAIN  ?UNUSUAL VAGINAL DISCHARGE OR ITCHING  ? ?Items with * indicate a potential emergency and should be followed up as soon as possible or go to the Emergency Department if any problems should occur. ? ?Please show the CHEMOTHERAPY ALERT CARD or IMMUNOTHERAPY  ALERT CARD at check-in to the Emergency Department and triage nurse. ? ?Should you have questions after your visit or need to cancel or reschedule your appointment, please contact [facility]  Dept: [phone]  and follow the prompts.  Office hours are 8:00 a.m. to 4:30 p.m. Monday - Friday. Please note that voicemails left after 4:00 p.m. may not be returned until the following business day.  We are closed weekends and major holidays. You have access to a nurse at all times for urgent questions. Please call the main number to the clinic Dept: [phone] and follow the prompts. ? ? ?For any non-urgent questions, you may also contact your provider using MyChart. We now offer e-Visits for anyone 3 and older to request care online for non-urgent symptoms. For details visit [email or web address]. ?  ?Also download the MyChart app! Go to the app store, search "MyChart", open the app, select [facility], and log in with your MyChart username and password. ? ?Due to Covid, a mask is required upon entering the hospital/clinic. If you do not have a mask, one will be given to you upon arrival. For doctor visits, patients may have 1 support person aged 12 or older with them. For treatment visits, patients cannot have anyone with them due to current Covid guidelines and our immunocompromised population.  ? ?

## 2022-03-22 ENCOUNTER — Other Ambulatory Visit: Payer: Self-pay

## 2022-03-22 ENCOUNTER — Inpatient Hospital Stay: Payer: 59

## 2022-03-22 VITALS — BP 131/79 | HR 76 | Temp 98.1°F | Resp 18

## 2022-03-22 DIAGNOSIS — Z170 Estrogen receptor positive status [ER+]: Secondary | ICD-10-CM

## 2022-03-22 DIAGNOSIS — Z5111 Encounter for antineoplastic chemotherapy: Secondary | ICD-10-CM | POA: Diagnosis not present

## 2022-03-22 MED ORDER — PEGFILGRASTIM-BMEZ 6 MG/0.6ML ~~LOC~~ SOSY
6.0000 mg | PREFILLED_SYRINGE | Freq: Once | SUBCUTANEOUS | Status: AC
  Administered 2022-03-22: 6 mg via SUBCUTANEOUS
  Filled 2022-03-22: qty 0.6

## 2022-03-27 ENCOUNTER — Other Ambulatory Visit: Payer: Self-pay | Admitting: *Deleted

## 2022-03-27 ENCOUNTER — Telehealth: Payer: Self-pay | Admitting: *Deleted

## 2022-03-27 ENCOUNTER — Encounter: Payer: Self-pay | Admitting: *Deleted

## 2022-03-27 DIAGNOSIS — Z170 Estrogen receptor positive status [ER+]: Secondary | ICD-10-CM

## 2022-03-27 NOTE — Telephone Encounter (Signed)
Left message on patient's identified VM with appt date/time for Dr. Cristal Generous appt for follow up post neo for 5/5 at 11:15. ?

## 2022-03-29 ENCOUNTER — Ambulatory Visit (HOSPITAL_COMMUNITY)
Admission: RE | Admit: 2022-03-29 | Discharge: 2022-03-29 | Disposition: A | Discharge status: Home / Self Care | Payer: 59 | Source: Ambulatory Visit | Attending: Adult Health | Admitting: Adult Health

## 2022-03-29 DIAGNOSIS — C50411 Malignant neoplasm of upper-outer quadrant of right female breast: Secondary | ICD-10-CM | POA: Insufficient documentation

## 2022-03-29 DIAGNOSIS — Z0189 Encounter for other specified special examinations: Secondary | ICD-10-CM

## 2022-03-29 DIAGNOSIS — Z170 Estrogen receptor positive status [ER+]: Secondary | ICD-10-CM

## 2022-03-29 DIAGNOSIS — Z09 Encounter for follow-up examination after completed treatment for conditions other than malignant neoplasm: Secondary | ICD-10-CM | POA: Diagnosis not present

## 2022-03-29 LAB — ECHOCARDIOGRAM COMPLETE
Area-P 1/2: 2.91 cm2
Calc EF: 59.7 %
S' Lateral: 2.5 cm
Single Plane A2C EF: 55.2 %
Single Plane A4C EF: 58.4 %

## 2022-03-29 NOTE — Progress Notes (Signed)
?  Echocardiogram ?2D Echocardiogram has been performed. ? ?Tonya Blair ?03/29/2022, 10:03 AM ?

## 2022-04-10 ENCOUNTER — Encounter: Payer: Self-pay | Admitting: Hematology and Oncology

## 2022-04-10 ENCOUNTER — Encounter: Payer: Self-pay | Admitting: *Deleted

## 2022-04-10 ENCOUNTER — Inpatient Hospital Stay (HOSPITAL_BASED_OUTPATIENT_CLINIC_OR_DEPARTMENT_OTHER): Payer: 59 | Admitting: Hematology and Oncology

## 2022-04-10 ENCOUNTER — Inpatient Hospital Stay: Payer: 59

## 2022-04-10 ENCOUNTER — Inpatient Hospital Stay: Payer: 59 | Attending: Hematology and Oncology

## 2022-04-10 VITALS — BP 134/79 | HR 80 | Temp 98.0°F | Resp 18

## 2022-04-10 DIAGNOSIS — C50411 Malignant neoplasm of upper-outer quadrant of right female breast: Secondary | ICD-10-CM | POA: Insufficient documentation

## 2022-04-10 DIAGNOSIS — T451X5A Adverse effect of antineoplastic and immunosuppressive drugs, initial encounter: Secondary | ICD-10-CM | POA: Diagnosis not present

## 2022-04-10 DIAGNOSIS — Z170 Estrogen receptor positive status [ER+]: Secondary | ICD-10-CM

## 2022-04-10 DIAGNOSIS — Z95828 Presence of other vascular implants and grafts: Secondary | ICD-10-CM | POA: Insufficient documentation

## 2022-04-10 DIAGNOSIS — Z5112 Encounter for antineoplastic immunotherapy: Secondary | ICD-10-CM | POA: Insufficient documentation

## 2022-04-10 DIAGNOSIS — D6481 Anemia due to antineoplastic chemotherapy: Secondary | ICD-10-CM | POA: Diagnosis not present

## 2022-04-10 DIAGNOSIS — Z5111 Encounter for antineoplastic chemotherapy: Secondary | ICD-10-CM | POA: Insufficient documentation

## 2022-04-10 DIAGNOSIS — Z79899 Other long term (current) drug therapy: Secondary | ICD-10-CM | POA: Insufficient documentation

## 2022-04-10 HISTORY — DX: Presence of other vascular implants and grafts: Z95.828

## 2022-04-10 LAB — CBC WITH DIFFERENTIAL/PLATELET
Abs Immature Granulocytes: 0.12 10*3/uL — ABNORMAL HIGH (ref 0.00–0.07)
Basophils Absolute: 0 10*3/uL (ref 0.0–0.1)
Basophils Relative: 0 %
Eosinophils Absolute: 0 10*3/uL (ref 0.0–0.5)
Eosinophils Relative: 0 %
HCT: 27.6 % — ABNORMAL LOW (ref 36.0–46.0)
Hemoglobin: 8.7 g/dL — ABNORMAL LOW (ref 12.0–15.0)
Immature Granulocytes: 1 %
Lymphocytes Relative: 20 %
Lymphs Abs: 2.3 10*3/uL (ref 0.7–4.0)
MCH: 27 pg (ref 26.0–34.0)
MCHC: 31.5 g/dL (ref 30.0–36.0)
MCV: 85.7 fL (ref 80.0–100.0)
Monocytes Absolute: 1 10*3/uL (ref 0.1–1.0)
Monocytes Relative: 9 %
Neutro Abs: 8.3 10*3/uL — ABNORMAL HIGH (ref 1.7–7.7)
Neutrophils Relative %: 70 %
Platelets: 273 10*3/uL (ref 150–400)
RBC: 3.22 MIL/uL — ABNORMAL LOW (ref 3.87–5.11)
RDW: 22.1 % — ABNORMAL HIGH (ref 11.5–15.5)
WBC: 11.7 10*3/uL — ABNORMAL HIGH (ref 4.0–10.5)
nRBC: 0.4 % — ABNORMAL HIGH (ref 0.0–0.2)

## 2022-04-10 LAB — COMPREHENSIVE METABOLIC PANEL
ALT: 21 U/L (ref 0–44)
AST: 15 U/L (ref 15–41)
Albumin: 4.2 g/dL (ref 3.5–5.0)
Alkaline Phosphatase: 65 U/L (ref 38–126)
Anion gap: 7 (ref 5–15)
BUN: 17 mg/dL (ref 6–20)
CO2: 27 mmol/L (ref 22–32)
Calcium: 9.9 mg/dL (ref 8.9–10.3)
Chloride: 106 mmol/L (ref 98–111)
Creatinine, Ser: 0.9 mg/dL (ref 0.44–1.00)
GFR, Estimated: >60 mL/min (ref >60)
Glucose, Bld: 166 mg/dL — ABNORMAL HIGH (ref 70–99)
Potassium: 3.9 mmol/L (ref 3.5–5.1)
Sodium: 140 mmol/L (ref 135–145)
Total Bilirubin: 0.3 mg/dL (ref 0.3–1.2)
Total Protein: 7 g/dL (ref 6.5–8.1)

## 2022-04-10 MED ORDER — ACETAMINOPHEN 325 MG PO TABS
650.0000 mg | ORAL_TABLET | Freq: Once | ORAL | Status: AC
  Administered 2022-04-10: 650 mg via ORAL

## 2022-04-10 MED ORDER — SODIUM CHLORIDE 0.9 % IV SOLN
75.0000 mg/m2 | Freq: Once | INTRAVENOUS | Status: AC
  Administered 2022-04-10: 150 mg via INTRAVENOUS
  Filled 2022-04-10: qty 15

## 2022-04-10 MED ORDER — HEPARIN SOD (PORK) LOCK FLUSH 100 UNIT/ML IV SOLN
500.0000 [IU] | Freq: Once | INTRAVENOUS | Status: AC | PRN
  Administered 2022-04-10: 500 [IU]

## 2022-04-10 MED ORDER — SODIUM CHLORIDE 0.9 % IV SOLN
420.0000 mg | Freq: Once | INTRAVENOUS | Status: AC
  Administered 2022-04-10: 420 mg via INTRAVENOUS
  Filled 2022-04-10: qty 14

## 2022-04-10 MED ORDER — SODIUM CHLORIDE 0.9% FLUSH
10.0000 mL | INTRAVENOUS | Status: DC | PRN
  Administered 2022-04-10: 10 mL

## 2022-04-10 MED ORDER — PALONOSETRON HCL INJECTION 0.25 MG/5ML
0.2500 mg | Freq: Once | INTRAVENOUS | Status: AC
  Administered 2022-04-10: 0.25 mg via INTRAVENOUS

## 2022-04-10 MED ORDER — DIPHENHYDRAMINE HCL 25 MG PO CAPS
50.0000 mg | ORAL_CAPSULE | Freq: Once | ORAL | Status: AC
  Administered 2022-04-10: 50 mg via ORAL

## 2022-04-10 MED ORDER — SODIUM CHLORIDE 0.9 % IV SOLN: Freq: Once | INTRAVENOUS | Status: AC

## 2022-04-10 MED ORDER — SODIUM CHLORIDE 0.9 % IV SOLN
700.0000 mg | Freq: Once | INTRAVENOUS | Status: AC
  Administered 2022-04-10: 700 mg via INTRAVENOUS
  Filled 2022-04-10: qty 70

## 2022-04-10 MED ORDER — SODIUM CHLORIDE 0.9 % IV SOLN
10.0000 mg | Freq: Once | INTRAVENOUS | Status: AC
  Administered 2022-04-10: 10 mg via INTRAVENOUS
  Filled 2022-04-10: qty 10

## 2022-04-10 MED ORDER — TRASTUZUMAB-DKST CHEMO 150 MG IV SOLR
6.0000 mg/kg | Freq: Once | INTRAVENOUS | Status: AC
  Administered 2022-04-10: 546 mg via INTRAVENOUS
  Filled 2022-04-10: qty 26

## 2022-04-10 MED ORDER — SODIUM CHLORIDE 0.9% FLUSH
10.0000 mL | Freq: Once | INTRAVENOUS | Status: AC
  Administered 2022-04-10: 10 mL

## 2022-04-10 MED ORDER — SODIUM CHLORIDE 0.9 % IV SOLN
150.0000 mg | Freq: Once | INTRAVENOUS | Status: AC
  Administered 2022-04-10: 150 mg via INTRAVENOUS
  Filled 2022-04-10: qty 150

## 2022-04-10 NOTE — Assessment & Plan Note (Signed)
Hemoglobin today is 8.7.  She is starting to feel little tired.  No indication for blood transfusion at this time.  We will continue to monitor this. ?

## 2022-04-10 NOTE — Patient Instructions (Signed)
[facility]  Discharge Instructions: ?Thank you for choosing [facility] to provide your oncology and hematology care.  ? ?If you have a lab appointment with the [facility], please go directly to the [facility] and check in at the registration area. ?  ?Wear comfortable clothing and clothing appropriate for easy access to any Portacath or PICC line.  ? ?We strive to give you quality time with your provider. You may need to reschedule your appointment if you arrive late (15 or more minutes).  Arriving late affects you and other patients whose appointments are after yours.  Also, if you miss three or more appointments without notifying the office, you may be dismissed from the clinic at the provider?s discretion.    ?  ?For prescription refill requests, have your pharmacy contact our office and allow 72 hours for refills to be completed.   ? ?Today you received the following chemotherapy and/or immunotherapy agents: Ogivri/Perjeta/Docetaxel/Carboplatin   ?  ?To help prevent nausea and vomiting after your treatment, we encourage you to take your nausea medication as directed. ? ?BELOW ARE SYMPTOMS THAT SHOULD BE REPORTED IMMEDIATELY: ?*FEVER GREATER THAN 100.4 F (38 ?C) OR HIGHER ?*CHILLS OR SWEATING ?*NAUSEA AND VOMITING THAT IS NOT CONTROLLED WITH YOUR NAUSEA MEDICATION ?*UNUSUAL SHORTNESS OF BREATH ?*UNUSUAL BRUISING OR BLEEDING ?*URINARY PROBLEMS (pain or burning when urinating, or frequent urination) ?*BOWEL PROBLEMS (unusual diarrhea, constipation, pain near the anus) ?TENDERNESS IN MOUTH AND THROAT WITH OR WITHOUT PRESENCE OF ULCERS (sore throat, sores in mouth, or a toothache) ?UNUSUAL RASH, SWELLING OR PAIN  ?UNUSUAL VAGINAL DISCHARGE OR ITCHING  ? ?Items with * indicate a potential emergency and should be followed up as soon as possible or go to the Emergency Department if any problems should occur. ? ?Please show the CHEMOTHERAPY ALERT CARD or IMMUNOTHERAPY  ALERT CARD at check-in to the Emergency Department and triage nurse. ? ?Should you have questions after your visit or need to cancel or reschedule your appointment, please contact [facility]  Dept: [phone]  and follow the prompts.  Office hours are 8:00 a.m. to 4:30 p.m. Monday - Friday. Please note that voicemails left after 4:00 p.m. may not be returned until the following business day.  We are closed weekends and major holidays. You have access to a nurse at all times for urgent questions. Please call the main number to the clinic Dept: [phone] and follow the prompts. ? ? ?For any non-urgent questions, you may also contact your provider using MyChart. We now offer e-Visits for anyone 53 and older to request care online for non-urgent symptoms. For details visit [email or web address]. ?  ?Also download the MyChart app! Go to the app store, search "MyChart", open the app, select [facility], and log in with your MyChart username and password. ? ?Due to Covid, a mask is required upon entering the hospital/clinic. If you do not have a mask, one will be given to you upon arrival. For doctor visits, patients may have 1 support person aged 21 or older with them. For treatment visits, patients cannot have anyone with them due to current Covid guidelines and our immunocompromised population.  ? ?

## 2022-04-10 NOTE — Assessment & Plan Note (Signed)
?#  Right breast invasive ductal carcinoma ?--Biopsy proved on 12/25/2021. Pathology showed invasive ductal carcinoma grade 3, ductal carcinoma in situ and calcifications, 5 cm from the nipple.  Prognostics from this mass showed ER 70% moderate, PR 20% moderate, HER2 positive by IHC, Ki-67 of 30%. ?--Recommendation is neoadjuvant TCHP q 21 days given T2 tumor with HER2 amplification. ?-- She has completed 4 cycles so far and has tolerated it remarkably well.  Okay to proceed with cycle 5 today if CMP is within parameters.  CBC reviewed and satisfactory.  Clinical exam, I do not feel any breast mass in the right breast today.  No palpable regional adenopathy.  This is consistent with complete clinical response.  I have sent an in basket message to our nurse navigation team to schedule a follow-up with her surgeon Dr. Donne Hazel to discuss surgery. ?She will also be a candidate for her2 compass study if she has residual disease in the final pathology report. ?

## 2022-04-10 NOTE — Progress Notes (Signed)
[place] ? ?PROGRESS NOTE ? ?Patient Care Team: ?Pcp, No as PCP - General ?Mauro Kaufmann, RN as Oncology Nurse Navigator ?Rockwell Germany, RN as Oncology Nurse Navigator ?Rolm Bookbinder, MD as Consulting Physician (General Surgery) ?Benay Pike, MD as Consulting Physician (Hematology and Oncology) ?Eppie Gibson, MD as Attending Physician (Radiation Oncology) ? ?CHIEF COMPLAINTS:  ?Right breast invasive ductal carcinoma. ? ?Oncology History  ?Malignant neoplasm of upper-outer quadrant of right breast in female, estrogen receptor positive ([employer])  ?12/31/2021 Initial Diagnosis  ? Malignant neoplasm of upper-outer quadrant of right breast in female, estrogen receptor positive ([place]) ? ?  ?01/02/2022 Cancer Staging  ? Staging form: Breast, AJCC 8th Edition ?- Clinical stage from 01/02/2022: Stage IB (cT2, cN0, cM0, G3, ER+, PR+, HER2+) - Signed by Benay Pike, MD on 01/02/2022 ?Histologic grading system: 3 grade system ? ?  ?01/14/2022 Genetic Testing  ? Negative hereditary cancer genetic testing: no pathogenic variants detected in [facility] CustomNext-Cancer +RNAinsight Panel.  The report date is January 14, 2022. ? ?The CustomNext-Cancer+RNAinsight panel offered by [facility] includes sequencing and rearrangement analysis for the following 47 genes:  APC, ATM, AXIN2, BARD1, BMPR1A, BRCA1, BRCA2, BRIP1, CDH1, CDK4, CDKN2A, CHEK2, DICER1, EPCAM, GREM1, HOXB13, MEN1, MLH1, MSH2, MSH3, MSH6, MUTYH, NBN, NF1, NF2, NTHL1, PALB2, PMS2, POLD1, POLE, PTEN, RAD51C, RAD51D, RECQL, RET, SDHA, SDHAF2, SDHB, SDHC, SDHD, SMAD4, SMARCA4, STK11, TP53, TSC1, TSC2, and VHL.  RNA data is routinely analyzed for use in variant interpretation for all genes. ?  ?01/16/2022 -  Chemotherapy  ? Patient is on Treatment Plan : BREAST  Docetaxel + Carboplatin + Trastuzumab + Pertuzumab  (TCHP) q21d   ? ?  ?  ? ? ?ONCOLOGIC HISTORY: ?1) 12/11/2021: Diagnostic mammogram and ultrasound showed suspicious mass in the 9 o'clock  region of the right breast. No evidence of enlarged adenopathy. ? ?2) 12/25/2021: Underwent US guided right breast core needle biopsy of breast mass at 9 o'clock. Pathology showed invasive ductal carcinoma grade 3, ductal carcinoma in situ and calcifications, 5 cm from the nipple.  Prognostics from this mass showed ER 70% moderate, PR 20% moderate, HER2 positive by IHC, Ki-67 of 30%. ? ?3) 01/09/2022:  MR Breast Bilateral: Biopsy proven malignancy in the outer right breast with ?associated linear/nodular enhancement extending anteriorly altogether measuring up to 5.1 cm. Indeterminate 0.6 cm tubular enhancing mass in the outer left breast. ? ?4) 01/16/2022: Initiated neoadjuvant therapy with TCHP q 21 days given T2 tumor with HER2 amplification.  ? ?5) 01/17/2022: Underwent MRI guided biopsy of indeterminate 6 mm enhancing mass over the left outer lower quadrant. 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 No evidence of malignancy.  ? ? ?HISTORY OF PRESENTING ILLNESS:  ? ?Tonya Blair 46 y.o. female is here for a toxicity evaluation after C 4 neoadjuvant TCHP therapy on 3/29 ?Patient is here for follow-up before cycle 5 of TCHP.  ?She is doing remarkably well, tolerating chemo very well so far.   ?Fatigue for the first one week ?No nausea, vomiting, diarrhea, mouth sores. ?Appetite is good, but cant taste a lot of things ?Milk ankle swelling bilaterally. ?Mild neuropathy, transient, stays for a few seconds to minutes. ? ?Rest of the pertinent 10 point ROS reviewed and negative. ? ?MEDICAL HISTORY:  ?Past Medical History:  ?Diagnosis Date  ? Anemia   ? IDA  ? Cancer [facility])   ? right breast cancer  ? Family history of prostate cancer 01/02/2022  ? ? ?SURGICAL HISTORY: ?Past  Surgical History:  ?Procedure Laterality Date  ? BREAST BIOPSY Right 12/25/2021  ? PORTACATH PLACEMENT N/A 01/14/2022  ? Procedure: INSERTION PORT-A-CATH;  Surgeon: Rolm Bookbinder, MD;  Location: [facility];  Service:  General;  Laterality: N/A;  ? THERAPEUTIC ABORTION    ? WISDOM TOOTH EXTRACTION    ? ? ?SOCIAL HISTORY: ?Social History  ? ?Socioeconomic History  ? Marital status: Single  ?  Spouse name: Not on file  ? Number of children: Not on file  ? Years of education: Not on file  ? Highest education level: Not on file  ?Occupational History  ? Not on file  ?Tobacco Use  ? Smoking status: Never  ? Smokeless tobacco: Never  ?Vaping Use  ? Vaping Use: Never used  ?Substance and Sexual Activity  ? Alcohol use: Yes  ?  Comment: occasional wine  ? Drug use: Never  ? Sexual activity: Yes  ?  Birth control/protection: I.U.D.  ?  Comment: Mirena IUD  ?Other Topics Concern  ? Not on file  ?Social History Narrative  ? Not on file  ? ?Social Determinants of Health  ? ?Financial Resource Strain: Low Risk   ? Difficulty of Paying Living Expenses: Not hard at all  ?Food Insecurity: No Food Insecurity  ? Worried About Charity fundraiser in the Last Year: Never true  ? Ran Out of Food in the Last Year: Never true  ?Transportation Needs: No Transportation Needs  ? Lack of Transportation (Medical): No  ? Lack of Transportation (Non-Medical): No  ?Physical Activity: Not on file  ?Stress: Not on file  ?Social Connections: Not on file  ?Intimate Partner Violence: Not on file  ? ? ?FAMILY HISTORY: ?Family History  ?Problem Relation Age of Onset  ? Prostate cancer Father 28  ? ? ?ALLERGIES:  is allergic to codeine. ? ?MEDICATIONS:  ?Current Outpatient Medications  ?Medication Sig Dispense Refill  ? Ascorbic Acid (VITAMIN C PO) Take 1 tablet by mouth in the morning.    ? BIOTIN PO Take 1 tablet by mouth in the morning.    ? Cholecalciferol (VITAMIN D3 PO) Take 1 tablet by mouth in the morning.    ? COD LIVER OIL PO Take by mouth.    ? dexamethasone (DECADRON) 4 MG tablet Take 2 tablets (8 mg total) by mouth 2 (two) times daily. Start the day before Taxotere. Then take daily x 3 days after chemotherapy. 30 tablet 1  ? FERROUS SULFATE PO Take 1  tablet by mouth in the morning.    ? ibuprofen (ADVIL) 200 MG tablet Take 200 mg by mouth every 8 (eight) hours as needed (pain.).    ? levonorgestrel (MIRENA, 52 MG,) 20 MCG/DAY IUD Mirena 20 mcg/24 hours (8 yrs) 52 mg intrauterine device    ? lidocaine-prilocaine (EMLA) cream Apply to affected area once 30 g 3  ? LORazepam (ATIVAN) 0.5 MG tablet Take 1 tablet (0.5 mg total) by mouth every 6 (six) hours as needed (Nausea or vomiting). 30 tablet 0  ? Multiple Minerals-Vitamins (CAL MAG ZINC +D3 PO) Take 1 tablet by mouth in the morning.    ? ondansetron (ZOFRAN) 8 MG tablet Take 1 tablet (8 mg total) by mouth 2 (two) times daily as needed (Nausea or vomiting). Start on the third day after chemotherapy. 30 tablet 1  ? OVER THE COUNTER MEDICATION Take 1 capsule by mouth in the morning. [place] Superfood Capsule    ? prochlorperazine (COMPAZINE) 10 MG tablet Take 1 tablet (  10 mg total) by mouth every 6 (six) hours as needed (Nausea or vomiting). 30 tablet 1  ? traMADol (ULTRAM) 50 MG tablet Take 2 tablets (100 mg total) by mouth every 6 (six) hours as needed. 10 tablet 0  ? ?No current facility-administered medications for this visit.  ? ? ?PHYSICAL EXAMINATION: ?ECOG PERFORMANCE STATUS: 1 - Symptomatic but completely ambulatory ? ?Vitals:  ? 04/10/22 0909  ?BP: 135/77  ?Pulse: 97  ?Resp: 18  ?Temp: 97.9 ?F (36.6 ?C)  ?SpO2: 100%  ? ? ? ? ?Filed Weights  ? 04/10/22 0909  ?Weight: 190 lb (86.2 kg)  ? ? ? ?GENERAL:alert, no distress and comfortable ?SKIN: tender nail beds, no skin rash   ?Breast: complete clinical response, breast mass not palpable in the right breast. No regional lymphadenopathy palpable. ?BLE: NO LE edema. ? ?LABORATORY DATA:  ?I have reviewed the data as listed ?Lab Results  ?Component Value Date  ? WBC 11.7 (H) 04/10/2022  ? HGB 8.7 (L) 04/10/2022  ? HCT 27.6 (L) 04/10/2022  ? MCV 85.7 04/10/2022  ? PLT 273 04/10/2022  ? ?Lab Results  ?Component Value Date  ? NA 140 03/19/2022  ? K 4.0 03/19/2022  ?  CL 108 03/19/2022  ? CO2 27 03/19/2022  ? ? ?RADIOGRAPHIC STUDIES: ?I have personally reviewed the radiological reports and agreed with the findings in the report. ? ?ASSESSMENT AND PLAN: ? ?Loyal Buba is a

## 2022-04-12 ENCOUNTER — Inpatient Hospital Stay: Payer: 59

## 2022-04-12 ENCOUNTER — Other Ambulatory Visit: Payer: Self-pay

## 2022-04-12 DIAGNOSIS — Z5111 Encounter for antineoplastic chemotherapy: Secondary | ICD-10-CM | POA: Diagnosis not present

## 2022-04-12 DIAGNOSIS — C50411 Malignant neoplasm of upper-outer quadrant of right female breast: Secondary | ICD-10-CM

## 2022-04-12 DIAGNOSIS — Z5112 Encounter for antineoplastic immunotherapy: Secondary | ICD-10-CM | POA: Diagnosis not present

## 2022-04-12 DIAGNOSIS — Z79899 Other long term (current) drug therapy: Secondary | ICD-10-CM | POA: Diagnosis not present

## 2022-04-12 MED ORDER — PEGFILGRASTIM-BMEZ 6 MG/0.6ML ~~LOC~~ SOSY
6.0000 mg | PREFILLED_SYRINGE | Freq: Once | SUBCUTANEOUS | Status: AC
  Administered 2022-04-12: 6 mg via SUBCUTANEOUS
  Filled 2022-04-12: qty 0.6

## 2022-04-12 NOTE — Patient Instructions (Signed)

## 2022-04-26 DIAGNOSIS — C50411 Malignant neoplasm of upper-outer quadrant of right female breast: Secondary | ICD-10-CM | POA: Diagnosis not present

## 2022-04-26 DIAGNOSIS — Z170 Estrogen receptor positive status [ER+]: Secondary | ICD-10-CM | POA: Diagnosis not present

## 2022-04-29 ENCOUNTER — Encounter: Payer: Self-pay | Admitting: Hematology and Oncology

## 2022-05-01 ENCOUNTER — Inpatient Hospital Stay: Payer: 59

## 2022-05-01 ENCOUNTER — Encounter: Payer: Self-pay | Admitting: Adult Health

## 2022-05-01 ENCOUNTER — Other Ambulatory Visit: Payer: Self-pay

## 2022-05-01 ENCOUNTER — Inpatient Hospital Stay: Payer: 59 | Attending: Adult Health

## 2022-05-01 ENCOUNTER — Encounter: Payer: Self-pay | Admitting: *Deleted

## 2022-05-01 ENCOUNTER — Inpatient Hospital Stay (HOSPITAL_BASED_OUTPATIENT_CLINIC_OR_DEPARTMENT_OTHER): Payer: 59 | Admitting: Adult Health

## 2022-05-01 VITALS — BP 130/69 | HR 107 | Temp 97.6°F | Resp 18 | Ht 62.0 in | Wt 189.5 lb

## 2022-05-01 VITALS — HR 99

## 2022-05-01 DIAGNOSIS — C50411 Malignant neoplasm of upper-outer quadrant of right female breast: Secondary | ICD-10-CM | POA: Insufficient documentation

## 2022-05-01 DIAGNOSIS — Z5112 Encounter for antineoplastic immunotherapy: Secondary | ICD-10-CM | POA: Insufficient documentation

## 2022-05-01 DIAGNOSIS — Z5189 Encounter for other specified aftercare: Secondary | ICD-10-CM | POA: Diagnosis not present

## 2022-05-01 DIAGNOSIS — Z95828 Presence of other vascular implants and grafts: Secondary | ICD-10-CM

## 2022-05-01 DIAGNOSIS — Z5111 Encounter for antineoplastic chemotherapy: Secondary | ICD-10-CM | POA: Insufficient documentation

## 2022-05-01 DIAGNOSIS — Z170 Estrogen receptor positive status [ER+]: Secondary | ICD-10-CM

## 2022-05-01 LAB — CBC WITH DIFFERENTIAL/PLATELET
Abs Immature Granulocytes: 0.13 10*3/uL — ABNORMAL HIGH (ref 0.00–0.07)
Basophils Absolute: 0 10*3/uL (ref 0.0–0.1)
Basophils Relative: 0 %
Eosinophils Absolute: 0 10*3/uL (ref 0.0–0.5)
Eosinophils Relative: 0 %
HCT: 28 % — ABNORMAL LOW (ref 36.0–46.0)
Hemoglobin: 8.6 g/dL — ABNORMAL LOW (ref 12.0–15.0)
Immature Granulocytes: 1 %
Lymphocytes Relative: 17 %
Lymphs Abs: 2 10*3/uL (ref 0.7–4.0)
MCH: 27.5 pg (ref 26.0–34.0)
MCHC: 30.7 g/dL (ref 30.0–36.0)
MCV: 89.5 fL (ref 80.0–100.0)
Monocytes Absolute: 1 10*3/uL (ref 0.1–1.0)
Monocytes Relative: 9 %
Neutro Abs: 8.4 10*3/uL — ABNORMAL HIGH (ref 1.7–7.7)
Neutrophils Relative %: 73 %
Platelets: 260 10*3/uL (ref 150–400)
RBC: 3.13 MIL/uL — ABNORMAL LOW (ref 3.87–5.11)
RDW: 23 % — ABNORMAL HIGH (ref 11.5–15.5)
WBC: 11.5 10*3/uL — ABNORMAL HIGH (ref 4.0–10.5)
nRBC: 0.3 % — ABNORMAL HIGH (ref 0.0–0.2)

## 2022-05-01 LAB — COMPREHENSIVE METABOLIC PANEL
ALT: 22 U/L (ref 0–44)
AST: 17 U/L (ref 15–41)
Albumin: 4.2 g/dL (ref 3.5–5.0)
Alkaline Phosphatase: 60 U/L (ref 38–126)
Anion gap: 7 (ref 5–15)
BUN: 18 mg/dL (ref 6–20)
CO2: 26 mmol/L (ref 22–32)
Calcium: 9.8 mg/dL (ref 8.9–10.3)
Chloride: 107 mmol/L (ref 98–111)
Creatinine, Ser: 0.88 mg/dL (ref 0.44–1.00)
GFR, Estimated: >60 mL/min (ref >60)
Glucose, Bld: 146 mg/dL — ABNORMAL HIGH (ref 70–99)
Potassium: 3.6 mmol/L (ref 3.5–5.1)
Sodium: 140 mmol/L (ref 135–145)
Total Bilirubin: 0.4 mg/dL (ref 0.3–1.2)
Total Protein: 7 g/dL (ref 6.5–8.1)

## 2022-05-01 MED ORDER — SODIUM CHLORIDE 0.9% FLUSH
10.0000 mL | Freq: Once | INTRAVENOUS | Status: AC
  Administered 2022-05-01: 10 mL

## 2022-05-01 MED ORDER — DIPHENHYDRAMINE HCL 25 MG PO CAPS
50.0000 mg | ORAL_CAPSULE | Freq: Once | ORAL | Status: AC
  Administered 2022-05-01: 50 mg via ORAL
  Filled 2022-05-01: qty 2

## 2022-05-01 MED ORDER — SODIUM CHLORIDE 0.9% FLUSH
10.0000 mL | INTRAVENOUS | Status: DC | PRN
  Administered 2022-05-01: 10 mL

## 2022-05-01 MED ORDER — SODIUM CHLORIDE 0.9 % IV SOLN
150.0000 mg | Freq: Once | INTRAVENOUS | Status: AC
  Administered 2022-05-01: 150 mg via INTRAVENOUS
  Filled 2022-05-01: qty 150

## 2022-05-01 MED ORDER — SODIUM CHLORIDE 0.9 % IV SOLN
75.0000 mg/m2 | Freq: Once | INTRAVENOUS | Status: AC
  Administered 2022-05-01: 150 mg via INTRAVENOUS
  Filled 2022-05-01: qty 15

## 2022-05-01 MED ORDER — HEPARIN SOD (PORK) LOCK FLUSH 100 UNIT/ML IV SOLN
500.0000 [IU] | Freq: Once | INTRAVENOUS | Status: AC | PRN
  Administered 2022-05-01: 500 [IU]

## 2022-05-01 MED ORDER — ACETAMINOPHEN 325 MG PO TABS
650.0000 mg | ORAL_TABLET | Freq: Once | ORAL | Status: AC
  Administered 2022-05-01: 650 mg via ORAL
  Filled 2022-05-01: qty 2

## 2022-05-01 MED ORDER — SODIUM CHLORIDE 0.9 % IV SOLN: Freq: Once | INTRAVENOUS | Status: AC

## 2022-05-01 MED ORDER — PALONOSETRON HCL INJECTION 0.25 MG/5ML
0.2500 mg | Freq: Once | INTRAVENOUS | Status: AC
  Administered 2022-05-01: 0.25 mg via INTRAVENOUS
  Filled 2022-05-01: qty 5

## 2022-05-01 MED ORDER — SODIUM CHLORIDE 0.9 % IV SOLN
10.0000 mg | Freq: Once | INTRAVENOUS | Status: AC
  Administered 2022-05-01: 10 mg via INTRAVENOUS
  Filled 2022-05-01: qty 10

## 2022-05-01 MED ORDER — SODIUM CHLORIDE 0.9 % IV SOLN
420.0000 mg | Freq: Once | INTRAVENOUS | Status: AC
  Administered 2022-05-01: 420 mg via INTRAVENOUS
  Filled 2022-05-01: qty 14

## 2022-05-01 MED ORDER — TRASTUZUMAB-DKST CHEMO 150 MG IV SOLR
6.0000 mg/kg | Freq: Once | INTRAVENOUS | Status: AC
  Administered 2022-05-01: 546 mg via INTRAVENOUS
  Filled 2022-05-01: qty 26

## 2022-05-01 MED ORDER — SODIUM CHLORIDE 0.9 % IV SOLN
700.0000 mg | Freq: Once | INTRAVENOUS | Status: AC
  Administered 2022-05-01: 700 mg via INTRAVENOUS
  Filled 2022-05-01: qty 70

## 2022-05-01 NOTE — Progress Notes (Signed)
[place] [facility] Follow up: ?  ? ?Pcp, No ?No address on file ? ? ?DIAGNOSIS:  Cancer Staging  ?Malignant neoplasm of upper-outer quadrant of right breast in female, estrogen receptor positive ([place]) ?Staging form: Breast, AJCC 8th Edition ?- Clinical stage from 01/02/2022: Stage IB (cT2, cN0, cM0, G3, ER+, PR+, HER2+) - Signed by Benay Pike, MD on 01/02/2022 ?Histologic grading system: 3 grade system ? ? ?SUMMARY OF ONCOLOGIC HISTORY: ?Oncology History  ?Malignant neoplasm of upper-outer quadrant of right breast in female, estrogen receptor positive ([place])  ?12/31/2021 Initial Diagnosis  ? Malignant neoplasm of upper-outer quadrant of right breast in female, estrogen receptor positive ([place]) ? ?  ?01/02/2022 Cancer Staging  ? Staging form: Breast, AJCC 8th Edition ?- Clinical stage from 01/02/2022: Stage IB (cT2, cN0, cM0, G3, ER+, PR+, HER2+) - Signed by Benay Pike, MD on 01/02/2022 ?Histologic grading system: 3 grade system ? ?  ?01/14/2022 Genetic Testing  ? Negative hereditary cancer genetic testing: no pathogenic variants detected in [facility] CustomNext-Cancer +RNAinsight Panel.  The report date is January 14, 2022. ? ?The CustomNext-Cancer+RNAinsight panel offered by [facility] includes sequencing and rearrangement analysis for the following 47 genes:  APC, ATM, AXIN2, BARD1, BMPR1A, BRCA1, BRCA2, BRIP1, CDH1, CDK4, CDKN2A, CHEK2, DICER1, EPCAM, GREM1, HOXB13, MEN1, MLH1, MSH2, MSH3, MSH6, MUTYH, NBN, NF1, NF2, NTHL1, PALB2, PMS2, POLD1, POLE, PTEN, RAD51C, RAD51D, RECQL, RET, SDHA, SDHAF2, SDHB, SDHC, SDHD, SMAD4, SMARCA4, STK11, TP53, TSC1, TSC2, and VHL.  RNA data is routinely analyzed for use in variant interpretation for all genes. ?  ?01/16/2022 -  Chemotherapy  ? Patient is on Treatment Plan : BREAST  Docetaxel + Carboplatin + Trastuzumab + Pertuzumab  (TCHP) q21d   ? ?   ? ? ?CURRENT THERAPY: TCHP ? ?INTERVAL HISTORY: ?Tonya Blair 46 y.o. female returns for evaluation prior to  receiving her 6 cycle of neoadjuvant chemotherapy with TCHP.  She is tolerating treatment relatively well.  She denies any peripheral neuropathy or significant diarrhea.  She is thankful this is her last chemotherapy treatment.  She has an MRI scheduled tomorrow and followed up with Dr. Donne Hazel last week to discuss potential upcoming surgery with lumpectomy and sentinel lymph node biopsy. ? ? ?Patient Active Problem List  ? Diagnosis Date Noted  ? Port-A-Cath in place 04/10/2022  ? Anemia due to antineoplastic chemotherapy 04/10/2022  ? Chemotherapy induced diarrhea 03/19/2022  ? Genetic testing 01/18/2022  ? Family history of prostate cancer 01/02/2022  ? Malignant neoplasm of upper-outer quadrant of right breast in female, estrogen receptor positive ([place]) 12/31/2021  ? Cervical intraepithelial neoplasia grade 2 10/28/2019  ? ? ?is allergic to codeine. ? ?MEDICAL HISTORY: ?Past Medical History:  ?Diagnosis Date  ? Anemia   ? IDA  ? Cancer [facility])   ? right breast cancer  ? Family history of prostate cancer 01/02/2022  ? ? ?SURGICAL HISTORY: ?Past Surgical History:  ?Procedure Laterality Date  ? BREAST BIOPSY Right 12/25/2021  ? PORTACATH PLACEMENT N/A 01/14/2022  ? Procedure: INSERTION PORT-A-CATH;  Surgeon: Rolm Bookbinder, MD;  Location: [place];  Service: General;  Laterality: N/A;  ? THERAPEUTIC ABORTION    ? WISDOM TOOTH EXTRACTION    ? ? ?SOCIAL HISTORY: ?Social History  ? ?Socioeconomic History  ? Marital status: Single  ?  Spouse name: Not on file  ? Number of children: Not on file  ? Years of education: Not on file  ? Highest education level: Not on file  ?Occupational History  ?  Not on file  ?Tobacco Use  ? Smoking status: Never  ? Smokeless tobacco: Never  ?Vaping Use  ? Vaping Use: Never used  ?Substance and Sexual Activity  ? Alcohol use: Yes  ?  Comment: occasional wine  ? Drug use: Never  ? Sexual activity: Yes  ?  Birth control/protection: I.U.D.  ?  Comment: Mirena IUD  ?Other Topics Concern  ? Not  on file  ?Social History Narrative  ? Not on file  ? ?Social Determinants of Health  ? ?Financial Resource Strain: Low Risk   ? Difficulty of Paying Living Expenses: Not hard at all  ?Food Insecurity: No Food Insecurity  ? Worried About Charity fundraiser in the Last Year: Never true  ? Ran Out of Food in the Last Year: Never true  ?Transportation Needs: No Transportation Needs  ? Lack of Transportation (Medical): No  ? Lack of Transportation (Non-Medical): No  ?Physical Activity: Not on file  ?Stress: Not on file  ?Social Connections: Not on file  ?Intimate Partner Violence: Not on file  ? ? ?FAMILY HISTORY: ?Family History  ?Problem Relation Age of Onset  ? Prostate cancer Father 71  ? ? ?Review of Systems  ?Constitutional:  Positive for fatigue (Mild). Negative for appetite change, chills, fever and unexpected weight change.  ?HENT:   Negative for hearing loss, lump/mass and trouble swallowing.   ?Eyes:  Negative for eye problems and icterus.  ?Respiratory:  Negative for chest tightness, cough and shortness of breath.   ?Cardiovascular:  Negative for chest pain, leg swelling and palpitations.  ?Gastrointestinal:  Negative for abdominal distention, abdominal pain, constipation, diarrhea, nausea and vomiting.  ?Endocrine: Negative for hot flashes.  ?Genitourinary:  Negative for difficulty urinating.   ?Musculoskeletal:  Negative for arthralgias.  ?Skin:  Negative for itching and rash.  ?Neurological:  Negative for dizziness, extremity weakness, headaches and numbness.  ?Hematological:  Negative for adenopathy. Does not bruise/bleed easily.  ?Psychiatric/Behavioral:  Negative for depression. The patient is not nervous/anxious.    ? ? ?PHYSICAL EXAMINATION ? ?ECOG PERFORMANCE STATUS: 1 - Symptomatic but completely ambulatory ? ?Vitals:  ? 05/01/22 1027  ?BP: 130/69  ?Pulse: (!) 107  ?Resp: 18  ?Temp: 97.6 ?F (36.4 ?C)  ?SpO2: 100%  ? ? ?Physical Exam ?Constitutional:   ?   General: She is not in acute distress. ?    Appearance: Normal appearance. She is not toxic-appearing.  ?HENT:  ?   Head: Normocephalic and atraumatic.  ?Eyes:  ?   General: No scleral icterus. ?Cardiovascular:  ?   Rate and Rhythm: Normal rate and regular rhythm.  ?   Pulses: Normal pulses.  ?   Heart sounds: Normal heart sounds.  ?Pulmonary:  ?   Effort: Pulmonary effort is normal.  ?   Breath sounds: Normal breath sounds.  ?Abdominal:  ?   General: Abdomen is flat. Bowel sounds are normal. There is no distension.  ?   Palpations: Abdomen is soft.  ?   Tenderness: There is no abdominal tenderness.  ?Musculoskeletal:     ?   General: No swelling.  ?   Cervical back: Neck supple.  ?Lymphadenopathy:  ?   Cervical: No cervical adenopathy.  ?Skin: ?   General: Skin is warm and dry.  ?   Findings: No rash.  ?Neurological:  ?   General: No focal deficit present.  ?   Mental Status: She is alert.  ?Psychiatric:     ?   Mood and  Affect: Mood normal.     ?   Behavior: Behavior normal.  ? ? ?LABORATORY DATA: ? ?CBC ?   ?Component Value Date/Time  ? WBC 11.5 (H) 05/01/2022 1014  ? RBC 3.13 (L) 05/01/2022 1014  ? HGB 8.6 (L) 05/01/2022 1014  ? HGB 12.5 01/02/2022 0833  ? HCT 28.0 (L) 05/01/2022 1014  ? PLT 260 05/01/2022 1014  ? PLT 304 01/02/2022 0833  ? MCV 89.5 05/01/2022 1014  ? MCH 27.5 05/01/2022 1014  ? MCHC 30.7 05/01/2022 1014  ? RDW 23.0 (H) 05/01/2022 1014  ? LYMPHSABS 2.0 05/01/2022 1014  ? MONOABS 1.0 05/01/2022 1014  ? EOSABS 0.0 05/01/2022 1014  ? BASOSABS 0.0 05/01/2022 1014  ? ? ?CMP  ?   ?Component Value Date/Time  ? NA 140 05/01/2022 1014  ? K 3.6 05/01/2022 1014  ? CL 107 05/01/2022 1014  ? CO2 26 05/01/2022 1014  ? GLUCOSE 146 (H) 05/01/2022 1014  ? BUN 18 05/01/2022 1014  ? CREATININE 0.88 05/01/2022 1014  ? CREATININE 1.07 (H) 01/02/2022 0518  ? CALCIUM 9.8 05/01/2022 1014  ? PROT 7.0 05/01/2022 1014  ? ALBUMIN 4.2 05/01/2022 1014  ? AST 17 05/01/2022 1014  ? AST 15 01/02/2022 0833  ? ALT 22 05/01/2022 1014  ? ALT 15 01/02/2022 0833  ? ALKPHOS 60  05/01/2022 1014  ? BILITOT 0.4 05/01/2022 1014  ? BILITOT 0.5 01/02/2022 0833  ? GFRNONAA >60 05/01/2022 1014  ? GFRNONAA >60 01/02/2022 3358  ? ? ? ? ?ASSESSMENT and PLAN:  ? ?Malignant neoplasm of uppe

## 2022-05-01 NOTE — Patient Instructions (Signed)
[facility]  Discharge Instructions: ?Thank you for choosing [facility] to provide your oncology and hematology care.  ? ?If you have a lab appointment with the [facility], please go directly to the [facility] and check in at the registration area. ?  ?Wear comfortable clothing and clothing appropriate for easy access to any Portacath or PICC line.  ? ?We strive to give you quality time with your provider. You may need to reschedule your appointment if you arrive late (15 or more minutes).  Arriving late affects you and other patients whose appointments are after yours.  Also, if you miss three or more appointments without notifying the office, you may be dismissed from the clinic at the provider?s discretion.    ?  ?For prescription refill requests, have your pharmacy contact our office and allow 72 hours for refills to be completed.   ? ?Today you received the following chemotherapy and/or immunotherapy agents: Ogivri/Perjeta/Docetaxel/Carboplatin   ?  ?To help prevent nausea and vomiting after your treatment, we encourage you to take your nausea medication as directed. ? ?BELOW ARE SYMPTOMS THAT SHOULD BE REPORTED IMMEDIATELY: ?*FEVER GREATER THAN 100.4 F (38 ?C) OR HIGHER ?*CHILLS OR SWEATING ?*NAUSEA AND VOMITING THAT IS NOT CONTROLLED WITH YOUR NAUSEA MEDICATION ?*UNUSUAL SHORTNESS OF BREATH ?*UNUSUAL BRUISING OR BLEEDING ?*URINARY PROBLEMS (pain or burning when urinating, or frequent urination) ?*BOWEL PROBLEMS (unusual diarrhea, constipation, pain near the anus) ?TENDERNESS IN MOUTH AND THROAT WITH OR WITHOUT PRESENCE OF ULCERS (sore throat, sores in mouth, or a toothache) ?UNUSUAL RASH, SWELLING OR PAIN  ?UNUSUAL VAGINAL DISCHARGE OR ITCHING  ? ?Items with * indicate a potential emergency and should be followed up as soon as possible or go to the Emergency Department if any problems should occur. ? ?Please show the CHEMOTHERAPY ALERT CARD or IMMUNOTHERAPY  ALERT CARD at check-in to the Emergency Department and triage nurse. ? ?Should you have questions after your visit or need to cancel or reschedule your appointment, please contact [facility]  Dept: [phone]  and follow the prompts.  Office hours are 8:00 a.m. to 4:30 p.m. Monday - Friday. Please note that voicemails left after 4:00 p.m. may not be returned until the following business day.  We are closed weekends and major holidays. You have access to a nurse at all times for urgent questions. Please call the main number to the clinic Dept: [phone] and follow the prompts. ? ? ?For any non-urgent questions, you may also contact your provider using MyChart. We now offer e-Visits for anyone 69 and older to request care online for non-urgent symptoms. For details visit [email or web address]. ?  ?Also download the MyChart app! Go to the app store, search "MyChart", open the app, select [facility], and log in with your MyChart username and password. ? ?Due to Covid, a mask is required upon entering the hospital/clinic. If you do not have a mask, one will be given to you upon arrival. For doctor visits, patients may have 1 support Veverly Larimer aged 15 or older with them. For treatment visits, patients cannot have anyone with them due to current Covid guidelines and our immunocompromised population.  ? ?

## 2022-05-02 ENCOUNTER — Ambulatory Visit
Admission: RE | Admit: 2022-05-02 | Discharge: 2022-05-02 | Disposition: A | Discharge status: Home / Self Care | Payer: 59 | Source: Ambulatory Visit | Attending: Hematology and Oncology | Admitting: Hematology and Oncology

## 2022-05-02 ENCOUNTER — Encounter: Payer: Self-pay | Admitting: Hematology and Oncology

## 2022-05-02 DIAGNOSIS — D0511 Intraductal carcinoma in situ of right breast: Secondary | ICD-10-CM | POA: Diagnosis not present

## 2022-05-02 DIAGNOSIS — C50411 Malignant neoplasm of upper-outer quadrant of right female breast: Secondary | ICD-10-CM

## 2022-05-02 IMAGING — MR MR BREAST BILAT WO/W CM
8 of 12 series · 31 of 48 positions shown · IV contrast (9 ml gadavist)
Comparison: Previous exams including breast MRI dated [DATE].

CLINICAL DATA: Biopsy-proven invasive ductal carcinoma and DCIS
within the RIGHT breast at the 9 o'clock axis, via ultrasound-guided
biopsy performed on [DATE]. Follow-up status post chemotherapy.

Also, biopsy-proven intraductal papilloma within the lower outer
quadrant of the LEFT breast, via MRI-guided biopsy performed on
[DATE].
EXAM:
BILATERAL BREAST MRI WITH AND WITHOUT CONTRAST
TECHNIQUE: Multiplanar, multisequence MR images of both breasts were obtained
prior to and following the intravenous administration of 9 ml of
Gadavist

[Series 2: t2_tirm_tra ipat (a-p) · axial · 3.0mm · 0.70mm/px · 1 of 55 slices shown]
[im 1/55]
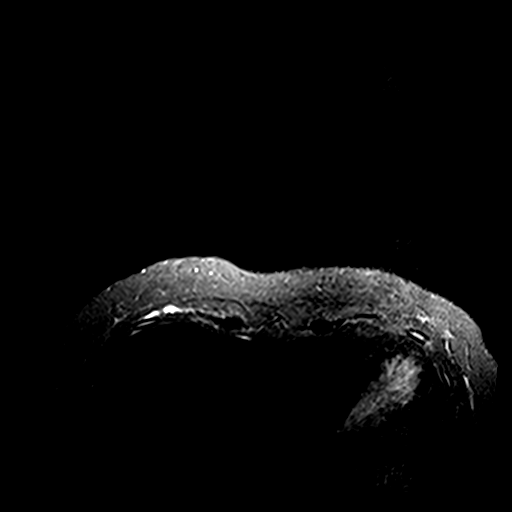

[Series 3: fl3d pre-cm no · axial · non-contrast · 1.2mm · 0.89mm/px · z∈[-80,+91]mm · 5 of 144 slices shown]
[im 1/144]
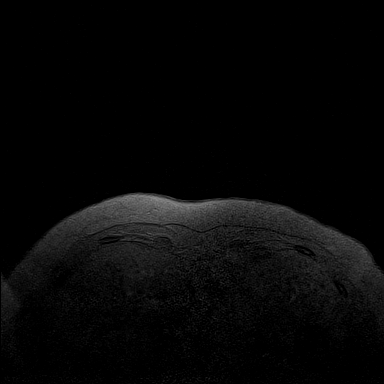
[im 36/144]
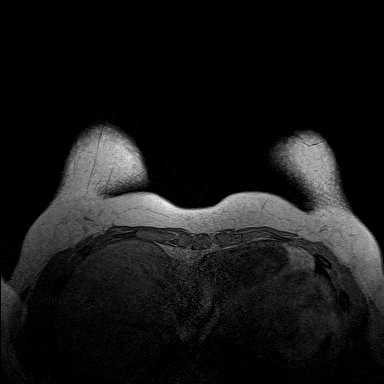
[im 72/144]
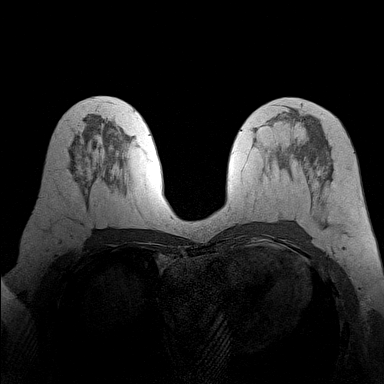
[im 108/144]
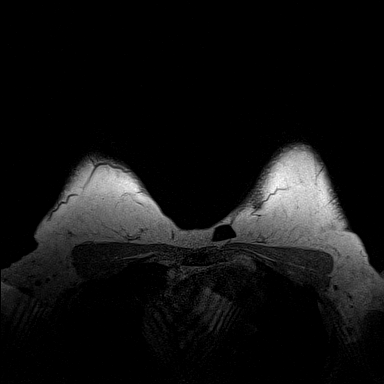
[im 144/144]
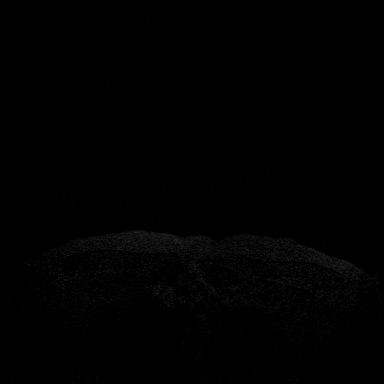

[Series 4: fl3d pre-cm · axial · non-contrast · 1.2mm · 0.89mm/px · z∈[-80,+91]mm · 5 of 144 slices shown]
[im 1/144]
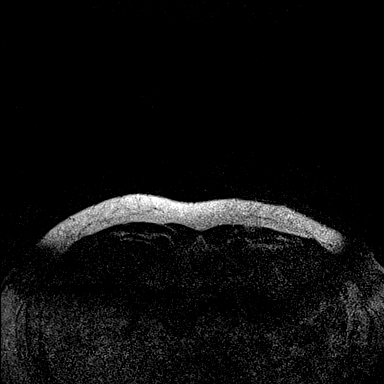
[im 36/144]
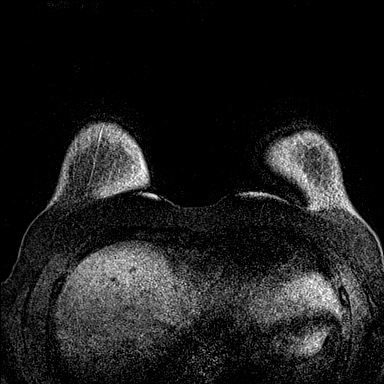
[im 72/144]
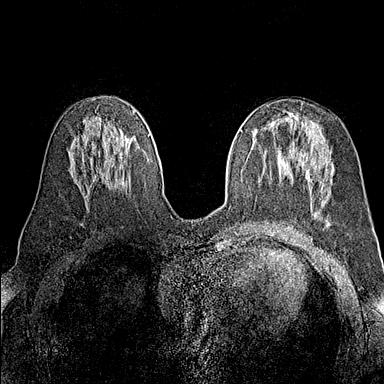
[im 108/144]
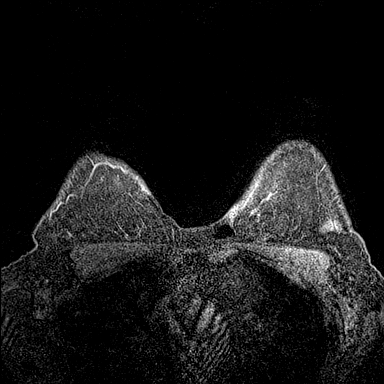
[im 144/144]
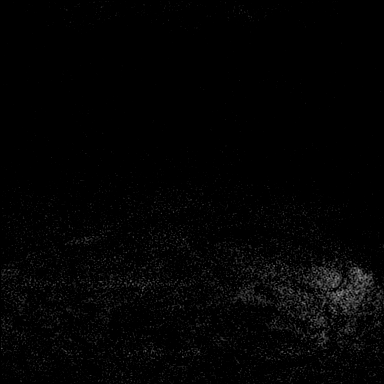

[Series 5: fl3d post-cm 20 · axial · 1.2mm · 0.89mm/px · z∈[-80,+91]mm · 5 of 144 slices shown (1 of 3)]
[im 1/144]
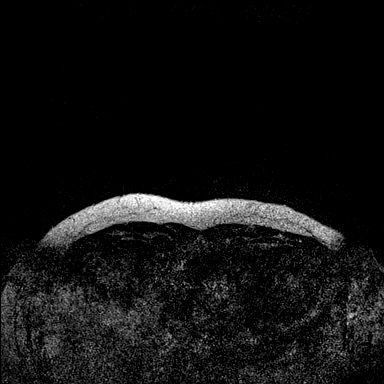
[im 36/144]
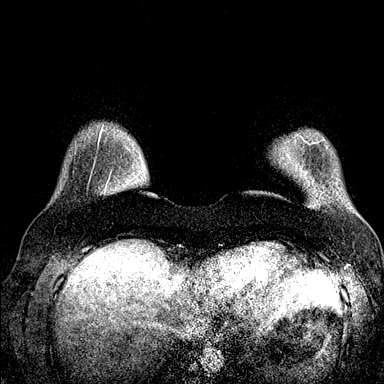
[im 72/144]
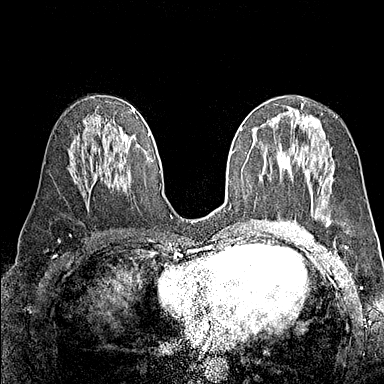
[im 108/144]
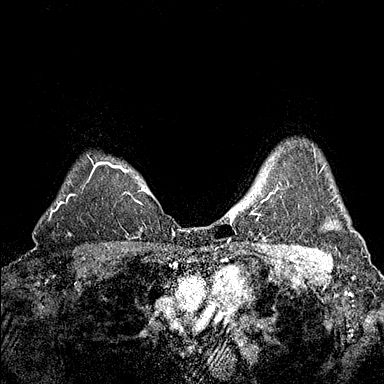
[im 144/144]
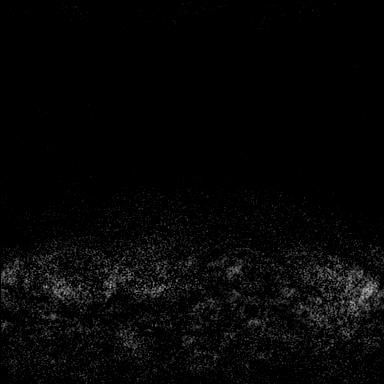

[Series 6: fl3d post-cm 20 · axial · 1.2mm · 0.89mm/px · z∈[-80,+91]mm · 5 of 144 slices shown (2 of 3)]
[im 1/144]
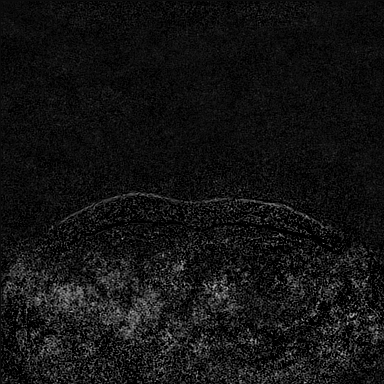
[im 36/144]
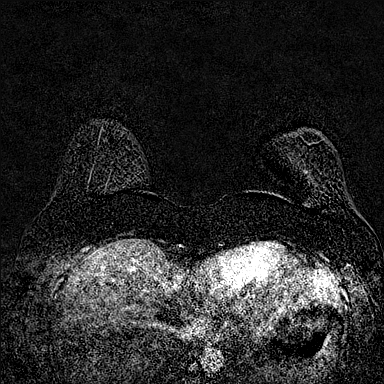
[im 72/144]
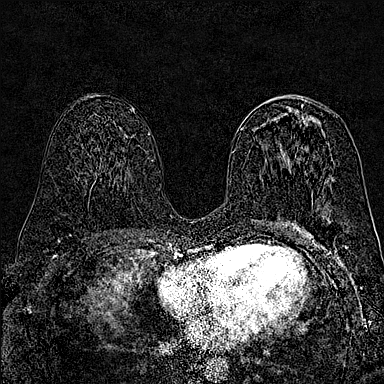
[im 108/144]
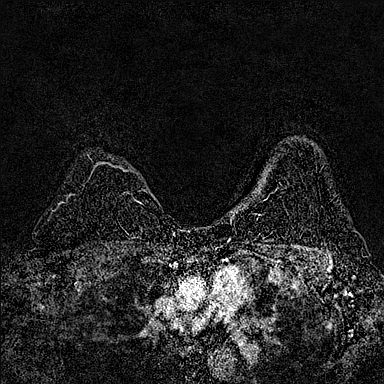
[im 144/144]
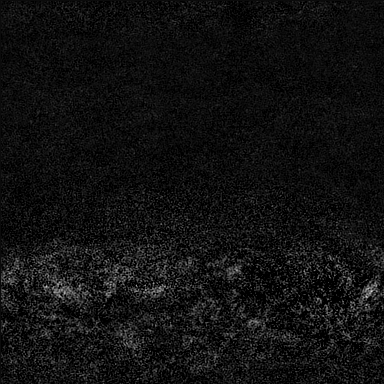

[Series 7: fl3d post-cm 20 · axial · 172.8mm · 0.89mm/px · 1 of 1 slices shown (3 of 3)]
[im 1/1]
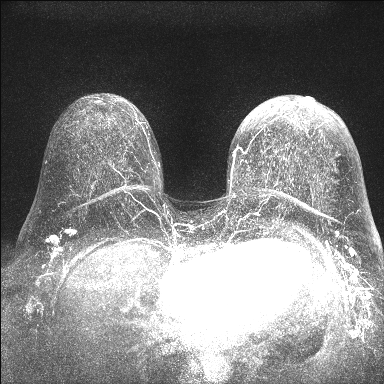

[Series 8: fl3d post-cm 3 · axial · 1.2mm · 0.89mm/px · z∈[-80,+91]mm · 6 of 144 slices shown (1 of 2)]
[im 1/144]
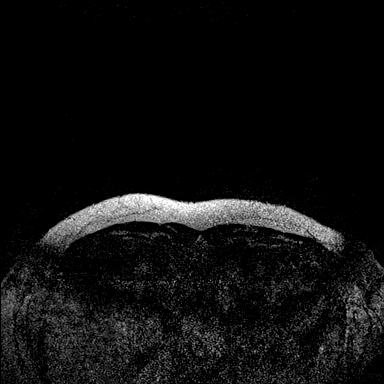
[im 29/144]
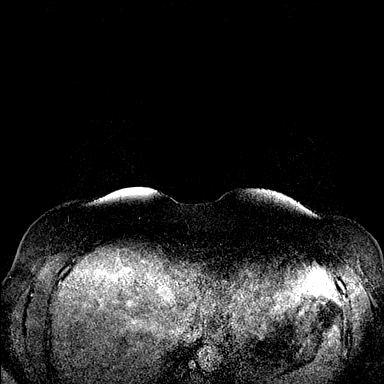
[im 58/144]
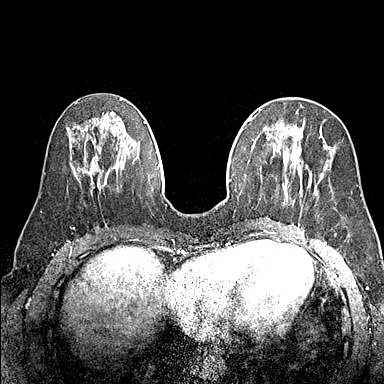
[im 86/144]
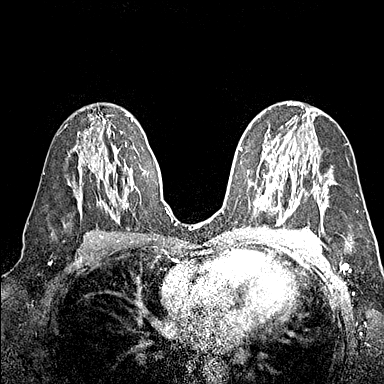
[im 115/144]
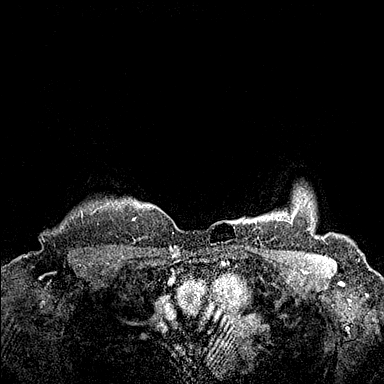
[im 144/144]
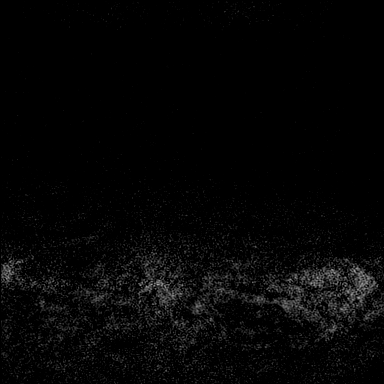

[Series 9: fl3d post-cm 3 · axial · 1.2mm · 0.89mm/px · z∈[-80,-12]mm · 3 of 144 slices shown (2 of 2)]
[im 1/144]
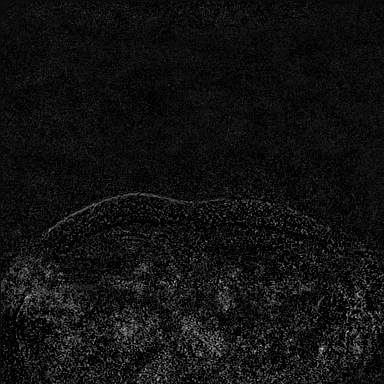
[im 29/144]
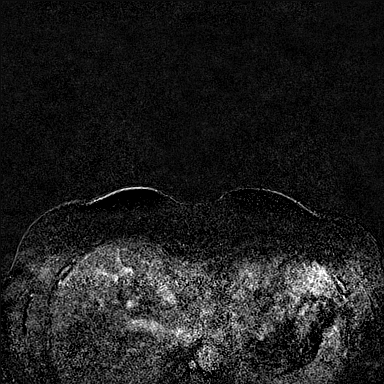
[im 58/144]
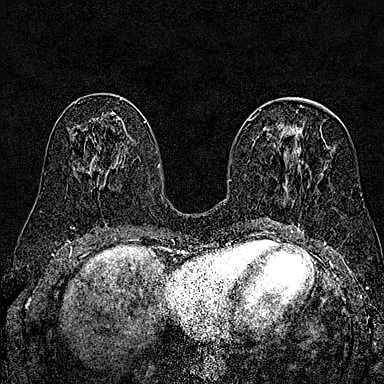

[31 of 48 positions shown; findings below may reference images not displayed]

Three-dimensional MR images were rendered by post-processing of the
original MR data on an independent workstation. The
three-dimensional MR images were interpreted, and findings are
reported in the following complete MRI report for this study. Three
dimensional images were evaluated at the independent interpreting
workstation using the DynaCAD thin client.
FINDINGS: Breast composition: c. Heterogeneous fibroglandular tissue.

Background parenchymal enhancement: Mild

Right breast: Biopsy clip artifact within the lower outer quadrant
of the RIGHT breast, corresponding to the site of patient's
biopsy-proven carcinoma. There is no persistent mass or non-mass
enhancement in this area, indicating a complete imaging response to
interval treatment.

No new suspicious enhancing mass, suspicious non-mass enhancement or
secondary signs of malignancy elsewhere within the RIGHT breast.

Left breast: Biopsy clip artifact within the lower outer quadrant of
the LEFT breast, corresponding to the site of patient's
biopsy-proven papilloma. There is also no persistent mass or
non-mass enhancement in this area.

No suspicious enhancing mass, suspicious non-mass enhancement or
secondary signs of malignancy elsewhere within the LEFT breast.

Lymph nodes: No abnormal appearing lymph nodes.

Ancillary findings:  None.
IMPRESSION: 1. Complete imaging response to interval treatment at the site of
patient's biopsy-proven carcinoma in the lower outer quadrant of the
RIGHT breast. There is no persistent mass or non-mass enhancement in
this area.
2. Also, no persistent mass or non-mass enhancement at the site of
patient's biopsy-proven papilloma within the lower outer quadrant of
the LEFT breast.
3. No new findings elsewhere within either breast.

RECOMMENDATION:
Per treatment plan for patient's biopsy-proven RIGHT breast
carcinoma and LEFT breast papilloma.

BI-RADS CATEGORY  1: Negative. However, biopsy clip artifact within
the lower outer quadrant of the RIGHT breast corresponds to the site
of a biopsy-proven carcinoma.

## 2022-05-02 MED ORDER — GADOBUTROL 1 MMOL/ML IV SOLN
9.0000 mL | Freq: Once | INTRAVENOUS | Status: AC | PRN
  Administered 2022-05-02: 9 mL via INTRAVENOUS

## 2022-05-02 NOTE — Assessment & Plan Note (Signed)
?#  Right breast invasive ductal carcinoma ?--Biopsy proved on 12/25/2021. Pathology showed invasive ductal carcinoma grade 3, ductal carcinoma in situ and calcifications, 5 cm from the nipple.  Prognostics from this mass showed ER 70% moderate, PR 20% moderate, HER2 positive by IHC, Ki-67 of 30%. ? ?Tonya Blair is here to be evaluated for cycle 6 of TCHP for her triple positive breast cancer.  She is doing well and will proceed with this treatment today.  Her labs are stable.  She will undergo her MRI tomorrow and then will have surgery mid June.  In the meantime we are continuing the Herceptin and Perjeta therapy.  I reviewed the fact that she will be receiving treatment with Herceptin based therapy for at least a year following her initial chemotherapy treatment.  We discussed that this involves monitoring her echocardiograms every 3 months.  She knows that after her surgery she may need to change to [place] based on if she has residual disease. ? ?We will see Tonya Blair back on May 30 for labs, follow-up, Herceptin Perjeta. ?

## 2022-05-03 ENCOUNTER — Other Ambulatory Visit: Payer: Self-pay

## 2022-05-03 ENCOUNTER — Inpatient Hospital Stay: Payer: 59

## 2022-05-03 VITALS — BP 112/68 | HR 84 | Temp 99.0°F | Resp 18

## 2022-05-03 DIAGNOSIS — C50411 Malignant neoplasm of upper-outer quadrant of right female breast: Secondary | ICD-10-CM

## 2022-05-03 DIAGNOSIS — Z5111 Encounter for antineoplastic chemotherapy: Secondary | ICD-10-CM | POA: Diagnosis not present

## 2022-05-03 DIAGNOSIS — Z5112 Encounter for antineoplastic immunotherapy: Secondary | ICD-10-CM | POA: Diagnosis not present

## 2022-05-03 DIAGNOSIS — Z5189 Encounter for other specified aftercare: Secondary | ICD-10-CM | POA: Diagnosis not present

## 2022-05-03 MED ORDER — PEGFILGRASTIM-BMEZ 6 MG/0.6ML ~~LOC~~ SOSY
6.0000 mg | PREFILLED_SYRINGE | Freq: Once | SUBCUTANEOUS | Status: AC
  Administered 2022-05-03: 6 mg via SUBCUTANEOUS
  Filled 2022-05-03: qty 0.6

## 2022-05-06 ENCOUNTER — Encounter: Payer: Self-pay | Admitting: *Deleted

## 2022-05-15 ENCOUNTER — Encounter: Payer: Self-pay | Admitting: *Deleted

## 2022-05-15 ENCOUNTER — Other Ambulatory Visit: Payer: Self-pay | Admitting: General Surgery

## 2022-05-15 DIAGNOSIS — C50411 Malignant neoplasm of upper-outer quadrant of right female breast: Secondary | ICD-10-CM

## 2022-05-16 ENCOUNTER — Other Ambulatory Visit: Payer: Self-pay | Admitting: General Surgery

## 2022-05-16 DIAGNOSIS — C50411 Malignant neoplasm of upper-outer quadrant of right female breast: Secondary | ICD-10-CM

## 2022-05-21 ENCOUNTER — Inpatient Hospital Stay: Payer: 59

## 2022-05-21 ENCOUNTER — Encounter: Payer: Self-pay | Admitting: *Deleted

## 2022-05-21 ENCOUNTER — Telehealth: Payer: Self-pay | Admitting: *Deleted

## 2022-05-21 ENCOUNTER — Encounter: Payer: Self-pay | Admitting: Hematology and Oncology

## 2022-05-21 ENCOUNTER — Inpatient Hospital Stay (HOSPITAL_BASED_OUTPATIENT_CLINIC_OR_DEPARTMENT_OTHER): Payer: 59 | Admitting: Hematology and Oncology

## 2022-05-21 ENCOUNTER — Other Ambulatory Visit: Payer: Self-pay

## 2022-05-21 VITALS — BP 107/49 | HR 96 | Temp 98.5°F | Resp 18

## 2022-05-21 DIAGNOSIS — Z5189 Encounter for other specified aftercare: Secondary | ICD-10-CM | POA: Diagnosis not present

## 2022-05-21 DIAGNOSIS — C50411 Malignant neoplasm of upper-outer quadrant of right female breast: Secondary | ICD-10-CM

## 2022-05-21 DIAGNOSIS — Z95828 Presence of other vascular implants and grafts: Secondary | ICD-10-CM

## 2022-05-21 DIAGNOSIS — Z5111 Encounter for antineoplastic chemotherapy: Secondary | ICD-10-CM | POA: Diagnosis not present

## 2022-05-21 DIAGNOSIS — Z170 Estrogen receptor positive status [ER+]: Secondary | ICD-10-CM

## 2022-05-21 DIAGNOSIS — Z5112 Encounter for antineoplastic immunotherapy: Secondary | ICD-10-CM | POA: Diagnosis not present

## 2022-05-21 LAB — CBC WITH DIFFERENTIAL/PLATELET
Abs Immature Granulocytes: 0.03 10*3/uL (ref 0.00–0.07)
Basophils Absolute: 0 10*3/uL (ref 0.0–0.1)
Basophils Relative: 0 %
Eosinophils Absolute: 0 10*3/uL (ref 0.0–0.5)
Eosinophils Relative: 0 %
HCT: 25.5 % — ABNORMAL LOW (ref 36.0–46.0)
Hemoglobin: 8.1 g/dL — ABNORMAL LOW (ref 12.0–15.0)
Immature Granulocytes: 1 %
Lymphocytes Relative: 30 %
Lymphs Abs: 1.8 10*3/uL (ref 0.7–4.0)
MCH: 29 pg (ref 26.0–34.0)
MCHC: 31.8 g/dL (ref 30.0–36.0)
MCV: 91.4 fL (ref 80.0–100.0)
Monocytes Absolute: 0.7 10*3/uL (ref 0.1–1.0)
Monocytes Relative: 12 %
Neutro Abs: 3.4 10*3/uL (ref 1.7–7.7)
Neutrophils Relative %: 57 %
Platelets: 239 10*3/uL (ref 150–400)
RBC: 2.79 MIL/uL — ABNORMAL LOW (ref 3.87–5.11)
RDW: 21.2 % — ABNORMAL HIGH (ref 11.5–15.5)
WBC: 5.9 10*3/uL (ref 4.0–10.5)
nRBC: 0.3 % — ABNORMAL HIGH (ref 0.0–0.2)

## 2022-05-21 LAB — COMPREHENSIVE METABOLIC PANEL
ALT: 19 U/L (ref 0–44)
AST: 16 U/L (ref 15–41)
Albumin: 3.7 g/dL (ref 3.5–5.0)
Alkaline Phosphatase: 56 U/L (ref 38–126)
Anion gap: 4 — ABNORMAL LOW (ref 5–15)
BUN: 18 mg/dL (ref 6–20)
CO2: 27 mmol/L (ref 22–32)
Calcium: 9.4 mg/dL (ref 8.9–10.3)
Chloride: 109 mmol/L (ref 98–111)
Creatinine, Ser: 0.83 mg/dL (ref 0.44–1.00)
GFR, Estimated: >60 mL/min (ref >60)
Glucose, Bld: 124 mg/dL — ABNORMAL HIGH (ref 70–99)
Potassium: 3.8 mmol/L (ref 3.5–5.1)
Sodium: 140 mmol/L (ref 135–145)
Total Bilirubin: 0.3 mg/dL (ref 0.3–1.2)
Total Protein: 5.9 g/dL — ABNORMAL LOW (ref 6.5–8.1)

## 2022-05-21 MED ORDER — SODIUM CHLORIDE 0.9 % IV SOLN: Freq: Once | INTRAVENOUS | Status: AC

## 2022-05-21 MED ORDER — SODIUM CHLORIDE 0.9% FLUSH
10.0000 mL | INTRAVENOUS | Status: DC | PRN
  Administered 2022-05-21: 10 mL

## 2022-05-21 MED ORDER — SODIUM CHLORIDE 0.9% FLUSH
10.0000 mL | Freq: Once | INTRAVENOUS | Status: AC
  Administered 2022-05-21: 10 mL

## 2022-05-21 MED ORDER — TRASTUZUMAB-DKST CHEMO 150 MG IV SOLR
6.0000 mg/kg | Freq: Once | INTRAVENOUS | Status: AC
  Administered 2022-05-21: 546 mg via INTRAVENOUS
  Filled 2022-05-21: qty 26

## 2022-05-21 MED ORDER — HEPARIN SOD (PORK) LOCK FLUSH 100 UNIT/ML IV SOLN
500.0000 [IU] | Freq: Once | INTRAVENOUS | Status: AC | PRN
  Administered 2022-05-21: 500 [IU]

## 2022-05-21 MED ORDER — SODIUM CHLORIDE 0.9 % IV SOLN
420.0000 mg | Freq: Once | INTRAVENOUS | Status: AC
  Administered 2022-05-21: 420 mg via INTRAVENOUS
  Filled 2022-05-21: qty 14

## 2022-05-21 MED ORDER — ACETAMINOPHEN 325 MG PO TABS
650.0000 mg | ORAL_TABLET | Freq: Once | ORAL | Status: AC
  Administered 2022-05-21: 650 mg via ORAL
  Filled 2022-05-21: qty 2

## 2022-05-21 NOTE — Assessment & Plan Note (Addendum)
#  Right breast invasive ductal carcinoma --Biopsy proved on 12/25/2021. Pathology showed invasive ductal carcinoma grade 3, ductal carcinoma in situ and calcifications, 5 cm from the nipple.  Prognostics from this mass showed ER 70% moderate, PR 20% moderate, HER2 positive by IHC, Ki-67 of 30%. --Recommendation is neoadjuvant TCHP q 21 days given T2 tumor with HER2 amplification. -- She completed neoadjuvant chemo, tolerated it very well -- Clinically and radiologically complete response Her surgery is scheduled end of June, hence will try to give herceptin and perjeta alone, patient wanted to wait until her daughter's graduation before surgery -- RTC one week after surgery to review pathology -- if complete response on pathology, ok to continue herceptin and perjeta for a total of one yr.

## 2022-05-21 NOTE — Progress Notes (Signed)
[facility]  PROGRESS NOTE  Patient Care Team: Pcp, No as PCP - General Mauro Kaufmann, RN as Oncology Nurse Navigator Rockwell Germany, RN as Oncology Nurse Navigator Rolm Bookbinder, MD as Consulting Physician (General Surgery) Benay Pike, MD as Consulting Physician (Hematology and Oncology) Eppie Gibson, MD as Attending Physician (Radiation Oncology)  CHIEF COMPLAINTS:  Right breast invasive ductal carcinoma.  Oncology History  Malignant neoplasm of upper-outer quadrant of right breast in female, estrogen receptor positive ([place])  12/31/2021 Initial Diagnosis   Malignant neoplasm of upper-outer quadrant of right breast in female, estrogen receptor positive ([place])    01/02/2022 Cancer Staging   Staging form: Breast, AJCC 8th Edition - Clinical stage from 01/02/2022: Stage IB (cT2, cN0, cM0, G3, ER+, PR+, HER2+) - Signed by Benay Pike, MD on 01/02/2022 Histologic grading system: 3 grade system    01/14/2022 Genetic Testing   Negative hereditary cancer genetic testing: no pathogenic variants detected in [facility] CustomNext-Cancer +RNAinsight Panel.  The report date is January 14, 2022.  The CustomNext-Cancer+RNAinsight panel offered by [facility] includes sequencing and rearrangement analysis for the following 47 genes:  APC, ATM, AXIN2, BARD1, BMPR1A, BRCA1, BRCA2, BRIP1, CDH1, CDK4, CDKN2A, CHEK2, DICER1, EPCAM, GREM1, HOXB13, MEN1, MLH1, MSH2, MSH3, MSH6, MUTYH, NBN, NF1, NF2, NTHL1, PALB2, PMS2, POLD1, POLE, PTEN, RAD51C, RAD51D, RECQL, RET, SDHA, SDHAF2, SDHB, SDHC, SDHD, SMAD4, SMARCA4, STK11, TP53, TSC1, TSC2, and VHL.  RNA data is routinely analyzed for use in variant interpretation for all genes.   01/16/2022 -  Chemotherapy   Patient is on Treatment Plan : BREAST  Docetaxel + Carboplatin + Trastuzumab + Pertuzumab  (TCHP) q21d         ONCOLOGIC HISTORY: 1) 12/11/2021: Diagnostic mammogram and ultrasound showed suspicious mass in the 9 o'clock  region of the right breast. No evidence of enlarged adenopathy.  2) 12/25/2021: Underwent US guided right breast core needle biopsy of breast mass at 9 o'clock. Pathology showed invasive ductal carcinoma grade 3, ductal carcinoma in situ and calcifications, 5 cm from the nipple.  Prognostics from this mass showed ER 70% moderate, PR 20% moderate, HER2 positive by IHC, Ki-67 of 30%.  3) 01/09/2022:  MR Breast Bilateral: Biopsy proven malignancy in the outer right breast with associated linear/nodular enhancement extending anteriorly altogether measuring up to 5.1 cm. Indeterminate 0.6 cm tubular enhancing mass in the outer left breast.  4) 01/16/2022: Initiated neoadjuvant therapy with TCHP q 21 days given T2 tumor with HER2 amplification.   5) 01/17/2022: Underwent MRI guided biopsy of indeterminate 6 mm enhancing mass over the left outer lower quadrant. 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 No evidence of malignancy.   6) Completed neoadjuvant chemotherapy TCHP from 01/16/2022 to 05/01/2022   HISTORY OF PRESENTING ILLNESS:   Tonya Blair 46 y.o. female is here for a toxicity evaluation after C 4 neoadjuvant TCHP therapy on 3/29 She completed 6 cycles of neoadjuvant TCHP. She is scheduled for surgery end of June She feels a bit tired, slowly recovering well. Nails are a bit discolored. Dysguesia has improved. Mild neuropathy/numbness in tips of fingers, right great toe. Mild ankle swelling bilaterally.  Rest of the pertinent 10 point ROS reviewed and negative.  MEDICAL HISTORY:  Past Medical History:  Diagnosis Date   Anemia    IDA   Cancer ([place])    right breast cancer   Family history of prostate cancer 01/02/2022    SURGICAL HISTORY: Past Surgical History:  Procedure Laterality Date  BREAST BIOPSY Right 12/25/2021   PORTACATH PLACEMENT N/A 01/14/2022   Procedure: INSERTION PORT-A-CATH;  Surgeon: Rolm Bookbinder, MD;  Location: [facility];   Service: General;  Laterality: N/A;   THERAPEUTIC ABORTION     WISDOM TOOTH EXTRACTION      SOCIAL HISTORY: Social History   Socioeconomic History   Marital status: Single    Spouse name: Not on file   Number of children: Not on file   Years of education: Not on file   Highest education level: Not on file  Occupational History   Not on file  Tobacco Use   Smoking status: Never   Smokeless tobacco: Never  Vaping Use   Vaping Use: Never used  Substance and Sexual Activity   Alcohol use: Yes    Comment: occasional wine   Drug use: Never   Sexual activity: Yes    Birth control/protection: I.U.D.    Comment: Mirena IUD  Other Topics Concern   Not on file  Social History Narrative   Not on file   Social Determinants of Health   Financial Resource Strain: Low Risk    Difficulty of Paying Living Expenses: Not hard at all  Food Insecurity: No Food Insecurity   Worried About Charity fundraiser in the Last Year: Never true   Arboriculturist in the Last Year: Never true  Transportation Needs: No Transportation Needs   Lack of Transportation (Medical): No   Lack of Transportation (Non-Medical): No  Physical Activity: Not on file  Stress: Not on file  Social Connections: Not on file  Intimate Partner Violence: Not on file    FAMILY HISTORY: Family History  Problem Relation Age of Onset   Prostate cancer Father 75    ALLERGIES:  is allergic to codeine.  MEDICATIONS:  Current Outpatient Medications  Medication Sig Dispense Refill   Ascorbic Acid (VITAMIN C PO) Take 1 tablet by mouth in the morning.     BIOTIN PO Take 1 tablet by mouth in the morning.     Cholecalciferol (VITAMIN D3 PO) Take 1 tablet by mouth in the morning.     COD LIVER OIL PO Take by mouth.     dexamethasone (DECADRON) 4 MG tablet Take 2 tablets (8 mg total) by mouth 2 (two) times daily. Start the day before Taxotere. Then take daily x 3 days after chemotherapy. 30 tablet 1   FERROUS SULFATE PO  Take 1 tablet by mouth in the morning.     ibuprofen (ADVIL) 200 MG tablet Take 200 mg by mouth every 8 (eight) hours as needed (pain.).     levonorgestrel (MIRENA, 52 MG,) 20 MCG/DAY IUD Mirena 20 mcg/24 hours (8 yrs) 52 mg intrauterine device     lidocaine-prilocaine (EMLA) cream Apply to affected area once 30 g 3   LORazepam (ATIVAN) 0.5 MG tablet Take 1 tablet (0.5 mg total) by mouth every 6 (six) hours as needed (Nausea or vomiting). 30 tablet 0   Multiple Minerals-Vitamins (CAL MAG ZINC +D3 PO) Take 1 tablet by mouth in the morning.     ondansetron (ZOFRAN) 8 MG tablet Take 1 tablet (8 mg total) by mouth 2 (two) times daily as needed (Nausea or vomiting). Start on the third day after chemotherapy. 30 tablet 1   OVER THE COUNTER MEDICATION Take 1 capsule by mouth in the morning. Sea Moss Superfood Capsule     prochlorperazine (COMPAZINE) 10 MG tablet Take 1 tablet (10 mg total) by mouth every 6 (six)  hours as needed (Nausea or vomiting). 30 tablet 1   traMADol (ULTRAM) 50 MG tablet Take 2 tablets (100 mg total) by mouth every 6 (six) hours as needed. 10 tablet 0   No current facility-administered medications for this visit.    PHYSICAL EXAMINATION: ECOG PERFORMANCE STATUS: 1 - Symptomatic but completely ambulatory  Vitals:   05/21/22 0921  BP: 116/71  Pulse: 100  Resp: 18  Temp: 97.7 F (36.5 C)  SpO2: 100%       Filed Weights   05/21/22 0921  Weight: 193 lb 4.8 oz (87.7 kg)     Physical Exam Constitutional:      Appearance: Normal appearance.  Cardiovascular:     Rate and Rhythm: Normal rate and regular rhythm.     Pulses: Normal pulses.     Heart sounds: Normal heart sounds.  Pulmonary:     Effort: Pulmonary effort is normal.     Breath sounds: Normal breath sounds.  Musculoskeletal:     Cervical back: Normal range of motion and neck supple.  Lymphadenopathy:     Cervical: No cervical adenopathy.  Skin:    General: Skin is warm and dry.  Neurological:      General: No focal deficit present.     Mental Status: She is alert.    LABORATORY DATA:  I have reviewed the data as listed Lab Results  Component Value Date   WBC 5.9 05/21/2022   HGB 8.1 (L) 05/21/2022   HCT 25.5 (L) 05/21/2022   MCV 91.4 05/21/2022   PLT 239 05/21/2022   Lab Results  Component Value Date   NA 140 05/21/2022   K 3.8 05/21/2022   CL 109 05/21/2022   CO2 27 05/21/2022    RADIOGRAPHIC STUDIES: I have personally reviewed the radiological reports and agreed with the findings in the report.  ASSESSMENT AND PLAN:  Tonya Blair is a 46 y.o. female who presents for a follow up for right breast invasive ductal carcinoma.   Malignant neoplasm of upper-outer quadrant of right breast in female, estrogen receptor positive ([place])  #Right breast invasive ductal carcinoma --Biopsy proved on 12/25/2021. Pathology showed invasive ductal carcinoma grade 3, ductal carcinoma in situ and calcifications, 5 cm from the nipple.  Prognostics from this mass showed ER 70% moderate, PR 20% moderate, HER2 positive by IHC, Ki-67 of 30%. --Recommendation is neoadjuvant TCHP q 21 days given T2 tumor with HER2 amplification. -- She completed neoadjuvant chemo, tolerated it very well -- Clinically and radiologically complete response Her surgery is scheduled end of June, hence will try to give herceptin and perjeta alone, patient wanted to wait until her daughter's graduation before surgery -- RTC one week after surgery to review pathology -- if complete response on pathology, ok to continue herceptin and perjeta for a total of one yr.   #Indeterminate 0.6 cm tubular enhancing mass in the outer left breast: --Seen on MRI breast on 01/09/2022 --Underwent MRI guided biopsy on 01/17/2022.  Pathology reports showed intraductal papilloma and fibrocystic changes. No evidence of malignancy.   I have spent a total of 30 minutes minutes of face-to-face and non-face-to-face time, preparing to see  the patient, performing a medically appropriate examination, counseling and educating the patient, ordering tests/procedures,documenting clinical information in the electronic health record, and care coordination.   We have once again discussed about Her2 COMPASS study if she doesn't have a complete pathologic response.  Benay Pike MD

## 2022-05-21 NOTE — Telephone Encounter (Addendum)
This RN called pt and obtained identified VM - message left requesting a return call.   ----- Message from Benay Pike, MD sent at 05/21/2022  2:49 PM EDT ----- Hemoglobin is slowly dropping. Can she come for a CBC mid June please.  She may need transfusion before surgery if she continues to drop.  Thanks,

## 2022-05-21 NOTE — Patient Instructions (Signed)
[facility]  Discharge Instructions: Thank you for choosing [facility] to provide your oncology and hematology care.   If you have a lab appointment with the [facility], please go directly to the [facility] and check in at the registration area.   Wear comfortable clothing and clothing appropriate for easy access to any Portacath or PICC line.   We strive to give you quality time with your provider. You may need to reschedule your appointment if you arrive late (15 or more minutes).  Arriving late affects you and other patients whose appointments are after yours.  Also, if you miss three or more appointments without notifying the office, you may be dismissed from the clinic at the provider's discretion.      For prescription refill requests, have your pharmacy contact our office and allow 72 hours for refills to be completed.    Today you received the following chemotherapy and/or immunotherapy agents: Ogivri/Perjeta To help prevent nausea and vomiting after your treatment, we encourage you to take your nausea medication as directed.  BELOW ARE SYMPTOMS THAT SHOULD BE REPORTED IMMEDIATELY: *FEVER GREATER THAN 100.4 F (38 C) OR HIGHER *CHILLS OR SWEATING *NAUSEA AND VOMITING THAT IS NOT CONTROLLED WITH YOUR NAUSEA MEDICATION *UNUSUAL SHORTNESS OF BREATH *UNUSUAL BRUISING OR BLEEDING *URINARY PROBLEMS (pain or burning when urinating, or frequent urination) *BOWEL PROBLEMS (unusual diarrhea, constipation, pain near the anus) TENDERNESS IN MOUTH AND THROAT WITH OR WITHOUT PRESENCE OF ULCERS (sore throat, sores in mouth, or a toothache) UNUSUAL RASH, SWELLING OR PAIN  UNUSUAL VAGINAL DISCHARGE OR ITCHING   Items with * indicate a potential emergency and should be followed up as soon as possible or go to the Emergency Department if any problems should occur.  Please show the CHEMOTHERAPY ALERT CARD or IMMUNOTHERAPY ALERT CARD at check-in to  the Emergency Department and triage nurse.  Should you have questions after your visit or need to cancel or reschedule your appointment, please contact [facility]  Dept: [phone]  and follow the prompts.  Office hours are 8:00 a.m. to 4:30 p.m. Monday - Friday. Please note that voicemails left after 4:00 p.m. may not be returned until the following business day.  We are closed weekends and major holidays. You have access to a nurse at all times for urgent questions. Please call the main number to the clinic Dept: [phone] and follow the prompts.   For any non-urgent questions, you may also contact your provider using MyChart. We now offer e-Visits for anyone 61 and older to request care online for non-urgent symptoms. For details visit [email or web address].   Also download the MyChart app! Go to the app store, search "MyChart", open the app, select [facility], and log in with your MyChart username and password.  Due to Covid, a mask is required upon entering the hospital/clinic. If you do not have a mask, one will be given to you upon arrival. For doctor visits, patients may have 1 support person aged 24 or older with them. For treatment visits, patients cannot have anyone with them due to current Covid guidelines and our immunocompromised population.   Pertuzumab injection What is this medication? PERTUZUMAB (per TOOZ ue mab) is a monoclonal antibody. It is used to treat breast cancer. This medicine may be used for other purposes; ask your health care provider or pharmacist if you have questions. COMMON BRAND NAME(S): PERJETA What should I tell my care team before I  take this medication? They need to know if you have any of these conditions: heart disease heart failure high blood pressure history of irregular heart beat recent or ongoing radiation therapy an unusual or allergic reaction to pertuzumab, other medicines, foods, dyes, or  preservatives pregnant or trying to get pregnant breast-feeding How should I use this medication? This medicine is for infusion into a vein. It is given by a health care professional in a hospital or clinic setting. Talk to your pediatrician regarding the use of this medicine in children. Special care may be needed. Overdosage: If you think you have taken too much of this medicine contact a poison control center or emergency room at once. NOTE: This medicine is only for you. Do not share this medicine with others. What if I miss a dose? It is important not to miss your dose. Call your doctor or health care professional if you are unable to keep an appointment. What may interact with this medication? Interactions are not expected. Give your health care provider a list of all the medicines, herbs, non-prescription drugs, or dietary supplements you use. Also tell them if you smoke, drink alcohol, or use illegal drugs. Some items may interact with your medicine. This list may not describe all possible interactions. Give your health care provider a list of all the medicines, herbs, non-prescription drugs, or dietary supplements you use. Also tell them if you smoke, drink alcohol, or use illegal drugs. Some items may interact with your medicine. What should I watch for while using this medication? Your condition will be monitored carefully while you are receiving this medicine. Report any side effects. Continue your course of treatment even though you feel ill unless your doctor tells you to stop. Do not become pregnant while taking this medicine or for 7 months after stopping it. Women should inform their doctor if they wish to become pregnant or think they might be pregnant. Women of child-bearing potential will need to have a negative pregnancy test before starting this medicine. There is a potential for serious side effects to an unborn child. Talk to your health care professional or pharmacist for  more information. Do not breast-feed an infant while taking this medicine or for 7 months after stopping it. Women must use effective birth control with this medicine. Call your doctor or health care professional for advice if you get a fever, chills or sore throat, or other symptoms of a cold or flu. Do not treat yourself. Try to avoid being around people who are sick. You may experience fever, chills, and headache during the infusion. Report any side effects during the infusion to your health care professional. What side effects may I notice from receiving this medication? Side effects that you should report to your doctor or health care professional as soon as possible: breathing problems chest pain or palpitations dizziness feeling faint or lightheaded fever or chills skin rash, itching or hives sore throat swelling of the face, lips, or tongue swelling of the legs or ankles unusually weak or tired Side effects that usually do not require medical attention (report to your doctor or health care professional if they continue or are bothersome): diarrhea hair loss nausea, vomiting tiredness This list may not describe all possible side effects. Call your doctor for medical advice about side effects. You may report side effects to FDA at 1-800-FDA-1088. Where should I keep my medication? This drug is given in a hospital or clinic and will not be stored at home. NOTE:  This sheet is a summary. It may not cover all possible information. If you have questions about this medicine, talk to your doctor, pharmacist, or health care provider.  2023 Elsevier/Gold Standard (2016-01-11 00:00:00) Trastuzumab injection for infusion What is this medication? TRASTUZUMAB (tras TOO zoo mab) is a monoclonal antibody. It is used to treat breast cancer and stomach cancer. This medicine may be used for other purposes; ask your health care provider or pharmacist if you have questions. COMMON BRAND NAME(S):  Herceptin, Janae Bridgeman, Ontruzant, Trazimera What should I tell my care team before I take this medication? They need to know if you have any of these conditions: heart disease heart failure lung or breathing disease, like asthma an unusual or allergic reaction to trastuzumab, benzyl alcohol, or other medications, foods, dyes, or preservatives pregnant or trying to get pregnant breast-feeding How should I use this medication? This drug is given as an infusion into a vein. It is administered in a hospital or clinic by a specially trained health care professional. Talk to your pediatrician regarding the use of this medicine in children. This medicine is not approved for use in children. Overdosage: If you think you have taken too much of this medicine contact a poison control center or emergency room at once. NOTE: This medicine is only for you. Do not share this medicine with others. What if I miss a dose? It is important not to miss a dose. Call your doctor or health care professional if you are unable to keep an appointment. What may interact with this medication? This medicine may interact with the following medications: certain types of chemotherapy, such as daunorubicin, doxorubicin, epirubicin, and idarubicin This list may not describe all possible interactions. Give your health care provider a list of all the medicines, herbs, non-prescription drugs, or dietary supplements you use. Also tell them if you smoke, drink alcohol, or use illegal drugs. Some items may interact with your medicine. What should I watch for while using this medication? Visit your doctor for checks on your progress. Report any side effects. Continue your course of treatment even though you feel ill unless your doctor tells you to stop. Call your doctor or health care professional for advice if you get a fever, chills or sore throat, or other symptoms of a cold or flu. Do not treat yourself. Try to avoid  being around people who are sick. You may experience fever, chills and shaking during your first infusion. These effects are usually mild and can be treated with other medicines. Report any side effects during the infusion to your health care professional. Fever and chills usually do not happen with later infusions. Do not become pregnant while taking this medicine or for 7 months after stopping it. Women should inform their doctor if they wish to become pregnant or think they might be pregnant. Women of child-bearing potential will need to have a negative pregnancy test before starting this medicine. There is a potential for serious side effects to an unborn child. Talk to your health care professional or pharmacist for more information. Do not breast-feed an infant while taking this medicine or for 7 months after stopping it. Women must use effective birth control with this medicine. What side effects may I notice from receiving this medication? Side effects that you should report to your doctor or health care professional as soon as possible: allergic reactions like skin rash, itching or hives, swelling of the face, lips, or tongue chest pain or palpitations cough  dizziness feeling faint or lightheaded, falls fever general ill feeling or flu-like symptoms signs of worsening heart failure like breathing problems; swelling in your legs and feet unusually weak or tired Side effects that usually do not require medical attention (report to your doctor or health care professional if they continue or are bothersome): bone pain changes in taste diarrhea joint pain nausea/vomiting weight loss This list may not describe all possible side effects. Call your doctor for medical advice about side effects. You may report side effects to FDA at 1-800-FDA-1088. Where should I keep my medication? This drug is given in a hospital or clinic and will not be stored at home. NOTE: This sheet is a summary. It  may not cover all possible information. If you have questions about this medicine, talk to your doctor, pharmacist, or health care provider.  2023 Elsevier/Gold Standard (2016-12-24 00:00:00)

## 2022-05-23 ENCOUNTER — Encounter: Payer: Self-pay | Admitting: *Deleted

## 2022-05-24 ENCOUNTER — Other Ambulatory Visit: Payer: Self-pay

## 2022-05-24 ENCOUNTER — Telehealth: Payer: Self-pay | Admitting: Hematology and Oncology

## 2022-05-24 ENCOUNTER — Inpatient Hospital Stay: Payer: 59 | Attending: Adult Health

## 2022-05-24 NOTE — Telephone Encounter (Signed)
.  Called patient to schedule appointment per 5/31 inbakset, patient is aware of date and time.

## 2022-05-24 NOTE — Progress Notes (Signed)
Per Dr. Chryl Heck, pt is not receiving any injection today. Pt informed.

## 2022-06-10 ENCOUNTER — Other Ambulatory Visit: Payer: Self-pay

## 2022-06-10 ENCOUNTER — Encounter (HOSPITAL_BASED_OUTPATIENT_CLINIC_OR_DEPARTMENT_OTHER): Payer: Self-pay | Admitting: General Surgery

## 2022-06-11 ENCOUNTER — Encounter: Payer: Self-pay | Admitting: Hematology and Oncology

## 2022-06-11 MED ORDER — ENSURE PRE-SURGERY PO LIQD: 296.0000 mL | Freq: Once | ORAL | Status: DC

## 2022-06-11 NOTE — Progress Notes (Signed)

## 2022-06-12 DIAGNOSIS — C50911 Malignant neoplasm of unspecified site of right female breast: Secondary | ICD-10-CM | POA: Diagnosis not present

## 2022-06-18 ENCOUNTER — Other Ambulatory Visit: Payer: Self-pay | Admitting: General Surgery

## 2022-06-18 ENCOUNTER — Ambulatory Visit
Admission: RE | Admit: 2022-06-18 | Discharge: 2022-06-18 | Disposition: A | Discharge status: Home / Self Care | Payer: 59 | Source: Ambulatory Visit | Attending: General Surgery | Admitting: General Surgery

## 2022-06-18 DIAGNOSIS — C50911 Malignant neoplasm of unspecified site of right female breast: Secondary | ICD-10-CM | POA: Diagnosis not present

## 2022-06-18 DIAGNOSIS — C50411 Malignant neoplasm of upper-outer quadrant of right female breast: Secondary | ICD-10-CM

## 2022-06-19 ENCOUNTER — Other Ambulatory Visit: Payer: Self-pay

## 2022-06-19 ENCOUNTER — Ambulatory Visit
Admission: RE | Admit: 2022-06-19 | Discharge: 2022-06-19 | Disposition: A | Discharge status: Home / Self Care | Payer: 59 | Source: Ambulatory Visit | Attending: General Surgery | Admitting: General Surgery

## 2022-06-19 ENCOUNTER — Ambulatory Visit (HOSPITAL_BASED_OUTPATIENT_CLINIC_OR_DEPARTMENT_OTHER): Payer: 59 | Admitting: Certified Registered"

## 2022-06-19 ENCOUNTER — Ambulatory Visit (HOSPITAL_BASED_OUTPATIENT_CLINIC_OR_DEPARTMENT_OTHER)
Admission: RE | Admit: 2022-06-19 | Discharge: 2022-06-19 | Disposition: A | Discharge status: Home / Self Care | Payer: 59 | Attending: General Surgery | Admitting: General Surgery

## 2022-06-19 ENCOUNTER — Encounter (HOSPITAL_BASED_OUTPATIENT_CLINIC_OR_DEPARTMENT_OTHER): Payer: Self-pay | Admitting: General Surgery

## 2022-06-19 ENCOUNTER — Encounter (HOSPITAL_BASED_OUTPATIENT_CLINIC_OR_DEPARTMENT_OTHER)
Admission: RE | Disposition: A | Discharge status: Home / Self Care | Payer: Self-pay | Source: Home / Self Care | Attending: General Surgery

## 2022-06-19 DIAGNOSIS — D649 Anemia, unspecified: Secondary | ICD-10-CM | POA: Diagnosis not present

## 2022-06-19 DIAGNOSIS — C50911 Malignant neoplasm of unspecified site of right female breast: Secondary | ICD-10-CM | POA: Diagnosis not present

## 2022-06-19 DIAGNOSIS — Z9221 Personal history of antineoplastic chemotherapy: Secondary | ICD-10-CM | POA: Diagnosis not present

## 2022-06-19 DIAGNOSIS — R928 Other abnormal and inconclusive findings on diagnostic imaging of breast: Secondary | ICD-10-CM | POA: Diagnosis not present

## 2022-06-19 DIAGNOSIS — N61 Mastitis without abscess: Secondary | ICD-10-CM | POA: Diagnosis not present

## 2022-06-19 DIAGNOSIS — N6031 Fibrosclerosis of right breast: Secondary | ICD-10-CM | POA: Diagnosis not present

## 2022-06-19 DIAGNOSIS — D242 Benign neoplasm of left breast: Secondary | ICD-10-CM | POA: Diagnosis not present

## 2022-06-19 DIAGNOSIS — Z170 Estrogen receptor positive status [ER+]: Secondary | ICD-10-CM | POA: Insufficient documentation

## 2022-06-19 DIAGNOSIS — G8918 Other acute postprocedural pain: Secondary | ICD-10-CM | POA: Diagnosis not present

## 2022-06-19 DIAGNOSIS — C50411 Malignant neoplasm of upper-outer quadrant of right female breast: Secondary | ICD-10-CM

## 2022-06-19 HISTORY — PX: BREAST LUMPECTOMY: SHX2

## 2022-06-19 HISTORY — PX: BREAST LUMPECTOMY WITH RADIOACTIVE SEED AND SENTINEL LYMPH NODE BIOPSY: SHX6550

## 2022-06-19 LAB — POCT PREGNANCY, URINE: Preg Test, Ur: NEGATIVE

## 2022-06-19 SURGERY — BREAST LUMPECTOMY WITH RADIOACTIVE SEED AND SENTINEL LYMPH NODE BIOPSY
Anesthesia: General | Site: Breast | Laterality: Right

## 2022-06-19 MED ORDER — ACETAMINOPHEN 160 MG/5ML PO SOLN: 1000.0000 mg | Freq: Once | ORAL | Status: DC | PRN

## 2022-06-19 MED ORDER — MAGTRACE LYMPHATIC TRACER
INTRAMUSCULAR | Status: DC | PRN
  Administered 2022-06-19: 2 mL via INTRAMUSCULAR

## 2022-06-19 MED ORDER — ACETAMINOPHEN 10 MG/ML IV SOLN
INTRAVENOUS | Status: AC
  Filled 2022-06-19: qty 100

## 2022-06-19 MED ORDER — ACETAMINOPHEN 500 MG PO TABS
1000.0000 mg | ORAL_TABLET | ORAL | Status: AC
  Administered 2022-06-19: 1000 mg via ORAL

## 2022-06-19 MED ORDER — CHLORHEXIDINE GLUCONATE CLOTH 2 % EX PADS: 6.0000 | MEDICATED_PAD | Freq: Once | CUTANEOUS | Status: DC

## 2022-06-19 MED ORDER — KETOROLAC TROMETHAMINE 15 MG/ML IJ SOLN
15.0000 mg | INTRAMUSCULAR | Status: AC
  Administered 2022-06-19: 15 mg via INTRAVENOUS

## 2022-06-19 MED ORDER — DEXAMETHASONE SODIUM PHOSPHATE 10 MG/ML IJ SOLN
INTRAMUSCULAR | Status: AC
  Filled 2022-06-19: qty 1

## 2022-06-19 MED ORDER — FENTANYL CITRATE (PF) 100 MCG/2ML IJ SOLN
INTRAMUSCULAR | Status: DC | PRN
  Administered 2022-06-19 (×2): 50 ug via INTRAVENOUS

## 2022-06-19 MED ORDER — 0.9 % SODIUM CHLORIDE (POUR BTL) OPTIME
TOPICAL | Status: DC | PRN
  Administered 2022-06-19: 1000 mL

## 2022-06-19 MED ORDER — CEFAZOLIN SODIUM-DEXTROSE 2-4 GM/100ML-% IV SOLN
INTRAVENOUS | Status: AC
  Filled 2022-06-19: qty 100

## 2022-06-19 MED ORDER — PROPOFOL 10 MG/ML IV BOLUS
INTRAVENOUS | Status: DC | PRN
  Administered 2022-06-19: 150 mg via INTRAVENOUS

## 2022-06-19 MED ORDER — KETOROLAC TROMETHAMINE 30 MG/ML IJ SOLN
INTRAMUSCULAR | Status: DC | PRN
  Administered 2022-06-19: 30 mg via INTRAVENOUS

## 2022-06-19 MED ORDER — KETOROLAC TROMETHAMINE 15 MG/ML IJ SOLN
INTRAMUSCULAR | Status: AC
  Filled 2022-06-19: qty 1

## 2022-06-19 MED ORDER — CEFAZOLIN SODIUM-DEXTROSE 2-4 GM/100ML-% IV SOLN
2.0000 g | INTRAVENOUS | Status: AC
  Administered 2022-06-19: 2 g via INTRAVENOUS

## 2022-06-19 MED ORDER — OXYCODONE HCL 5 MG PO TABS: 5.0000 mg | ORAL_TABLET | Freq: Four times a day (QID) | ORAL | 0 refills | Status: DC | PRN

## 2022-06-19 MED ORDER — LIDOCAINE 2% (20 MG/ML) 5 ML SYRINGE
INTRAMUSCULAR | Status: AC
  Filled 2022-06-19: qty 5

## 2022-06-19 MED ORDER — BUPIVACAINE-EPINEPHRINE (PF) 0.5% -1:200000 IJ SOLN
INTRAMUSCULAR | Status: DC | PRN
  Administered 2022-06-19: 30 mL via PERINEURAL

## 2022-06-19 MED ORDER — PROPOFOL 500 MG/50ML IV EMUL
INTRAVENOUS | Status: DC | PRN
  Administered 2022-06-19: 140 ug/kg/min via INTRAVENOUS

## 2022-06-19 MED ORDER — FENTANYL CITRATE (PF) 100 MCG/2ML IJ SOLN
INTRAMUSCULAR | Status: AC
  Filled 2022-06-19: qty 2

## 2022-06-19 MED ORDER — LACTATED RINGERS IV SOLN: INTRAVENOUS | Status: DC

## 2022-06-19 MED ORDER — OXYCODONE HCL 5 MG PO TABS: 5.0000 mg | ORAL_TABLET | Freq: Once | ORAL | Status: DC | PRN

## 2022-06-19 MED ORDER — ACETAMINOPHEN 10 MG/ML IV SOLN: 1000.0000 mg | Freq: Once | INTRAVENOUS | Status: DC | PRN

## 2022-06-19 MED ORDER — FENTANYL CITRATE (PF) 100 MCG/2ML IJ SOLN
50.0000 ug | Freq: Once | INTRAMUSCULAR | Status: AC
  Administered 2022-06-19: 50 ug via INTRAVENOUS

## 2022-06-19 MED ORDER — ACETAMINOPHEN 500 MG PO TABS: 1000.0000 mg | ORAL_TABLET | Freq: Once | ORAL | Status: DC | PRN

## 2022-06-19 MED ORDER — LIDOCAINE HCL (CARDIAC) PF 100 MG/5ML IV SOSY
PREFILLED_SYRINGE | INTRAVENOUS | Status: DC | PRN
  Administered 2022-06-19: 60 mg via INTRAVENOUS

## 2022-06-19 MED ORDER — FENTANYL CITRATE (PF) 100 MCG/2ML IJ SOLN: 25.0000 ug | INTRAMUSCULAR | Status: DC | PRN

## 2022-06-19 MED ORDER — KETOROLAC TROMETHAMINE 30 MG/ML IJ SOLN
INTRAMUSCULAR | Status: AC
  Filled 2022-06-19: qty 1

## 2022-06-19 MED ORDER — ONDANSETRON HCL 4 MG/2ML IJ SOLN
INTRAMUSCULAR | Status: AC
  Filled 2022-06-19: qty 2

## 2022-06-19 MED ORDER — ONDANSETRON HCL 4 MG/2ML IJ SOLN
INTRAMUSCULAR | Status: DC | PRN
  Administered 2022-06-19: 4 mg via INTRAVENOUS

## 2022-06-19 MED ORDER — PROPOFOL 500 MG/50ML IV EMUL
INTRAVENOUS | Status: AC
  Filled 2022-06-19: qty 50

## 2022-06-19 MED ORDER — DEXAMETHASONE SODIUM PHOSPHATE 4 MG/ML IJ SOLN
INTRAMUSCULAR | Status: DC | PRN
  Administered 2022-06-19: 5 mg via INTRAVENOUS

## 2022-06-19 MED ORDER — MIDAZOLAM HCL 2 MG/2ML IJ SOLN
INTRAMUSCULAR | Status: AC
  Filled 2022-06-19: qty 2

## 2022-06-19 MED ORDER — ACETAMINOPHEN 10 MG/ML IV SOLN
INTRAVENOUS | Status: DC | PRN
  Administered 2022-06-19: 1000 mg via INTRAVENOUS

## 2022-06-19 MED ORDER — MIDAZOLAM HCL 2 MG/2ML IJ SOLN
2.0000 mg | Freq: Once | INTRAMUSCULAR | Status: AC
  Administered 2022-06-19: 2 mg via INTRAVENOUS

## 2022-06-19 MED ORDER — BUPIVACAINE-EPINEPHRINE 0.25% -1:200000 IJ SOLN
INTRAMUSCULAR | Status: DC | PRN
  Administered 2022-06-19: 1 mL

## 2022-06-19 MED ORDER — ACETAMINOPHEN 500 MG PO TABS
ORAL_TABLET | ORAL | Status: AC
  Filled 2022-06-19: qty 2

## 2022-06-19 MED ORDER — OXYCODONE HCL 5 MG/5ML PO SOLN: 5.0000 mg | Freq: Once | ORAL | Status: DC | PRN

## 2022-06-19 MED ORDER — SODIUM CHLORIDE (PF) 0.9 % IJ SOLN
INTRAVENOUS | Status: DC | PRN
  Administered 2022-06-19: 4 mL

## 2022-06-19 SURGICAL SUPPLY — 59 items
ADH SKN CLS APL DERMABOND .7 (GAUZE/BANDAGES/DRESSINGS) ×1
APL PRP STRL LF DISP 70% ISPRP (MISCELLANEOUS) ×1
APPLIER CLIP 9.375 MED OPEN (MISCELLANEOUS) ×2
APR CLP MED 9.3 20 MLT OPN (MISCELLANEOUS) ×1
BINDER BREAST LRG (GAUZE/BANDAGES/DRESSINGS) IMPLANT
BINDER BREAST MEDIUM (GAUZE/BANDAGES/DRESSINGS) IMPLANT
BINDER BREAST XLRG (GAUZE/BANDAGES/DRESSINGS) ×1 IMPLANT
BINDER BREAST XXLRG (GAUZE/BANDAGES/DRESSINGS) IMPLANT
BLADE SURG 15 STRL LF DISP TIS (BLADE) ×1 IMPLANT
BLADE SURG 15 STRL SS (BLADE) ×2
CANISTER SUC SOCK COL 7IN (MISCELLANEOUS) IMPLANT
CANISTER SUCT 1200ML W/VALVE (MISCELLANEOUS) IMPLANT
CHLORAPREP W/TINT 26 (MISCELLANEOUS) ×2 IMPLANT
CLIP APPLIE 9.375 MED OPEN (MISCELLANEOUS) IMPLANT
CLIP TI WIDE RED SMALL 6 (CLIP) ×2 IMPLANT
COVER BACK TABLE 60X90IN (DRAPES) ×2 IMPLANT
COVER MAYO STAND STRL (DRAPES) ×2 IMPLANT
COVER PROBE W GEL 5X96 (DRAPES) ×2 IMPLANT
DERMABOND ADVANCED (GAUZE/BANDAGES/DRESSINGS) ×1
DERMABOND ADVANCED .7 DNX12 (GAUZE/BANDAGES/DRESSINGS) ×1 IMPLANT
DRAPE LAPAROSCOPIC ABDOMINAL (DRAPES) ×2 IMPLANT
DRAPE UTILITY XL STRL (DRAPES) ×2 IMPLANT
ELECT COATED BLADE 2.86 ST (ELECTRODE) ×2 IMPLANT
ELECT REM PT RETURN 9FT ADLT (ELECTROSURGICAL) ×2
ELECTRODE REM PT RTRN 9FT ADLT (ELECTROSURGICAL) ×1 IMPLANT
GLOVE BIO SURGEON STRL SZ7 (GLOVE) ×4 IMPLANT
GLOVE BIOGEL PI IND STRL 7.5 (GLOVE) ×1 IMPLANT
GLOVE BIOGEL PI INDICATOR 7.5 (GLOVE) ×1
GOWN STRL REUS W/ TWL LRG LVL3 (GOWN DISPOSABLE) ×2 IMPLANT
GOWN STRL REUS W/TWL LRG LVL3 (GOWN DISPOSABLE) ×4
HEMOSTAT ARISTA ABSORB 3G PWDR (HEMOSTASIS) IMPLANT
KIT MARKER MARGIN INK (KITS) ×2 IMPLANT
NDL HYPO 25X1 1.5 SAFETY (NEEDLE) ×1 IMPLANT
NDL SAFETY ECLIPSE 18X1.5 (NEEDLE) IMPLANT
NEEDLE HYPO 18GX1.5 SHARP (NEEDLE)
NEEDLE HYPO 25X1 1.5 SAFETY (NEEDLE) ×2 IMPLANT
NS IRRIG 1000ML POUR BTL (IV SOLUTION) IMPLANT
PACK BASIN DAY SURGERY FS (CUSTOM PROCEDURE TRAY) ×2 IMPLANT
PENCIL SMOKE EVACUATOR (MISCELLANEOUS) ×2 IMPLANT
RETRACTOR ONETRAX LX 90X20 (MISCELLANEOUS) IMPLANT
SLEEVE SCD COMPRESS KNEE MED (STOCKING) ×2 IMPLANT
SPIKE FLUID TRANSFER (MISCELLANEOUS) IMPLANT
SPONGE T-LAP 4X18 ~~LOC~~+RFID (SPONGE) ×2 IMPLANT
STRIP CLOSURE SKIN 1/2X4 (GAUZE/BANDAGES/DRESSINGS) ×2 IMPLANT
SUT ETHILON 2 0 FS 18 (SUTURE) IMPLANT
SUT MNCRL AB 4-0 PS2 18 (SUTURE) ×2 IMPLANT
SUT MON AB 5-0 PS2 18 (SUTURE) ×1 IMPLANT
SUT SILK 2 0 SH (SUTURE) ×1 IMPLANT
SUT VIC AB 2-0 SH 27 (SUTURE) ×6
SUT VIC AB 2-0 SH 27XBRD (SUTURE) ×1 IMPLANT
SUT VIC AB 3-0 SH 27 (SUTURE) ×4
SUT VIC AB 3-0 SH 27X BRD (SUTURE) ×1 IMPLANT
SUT VIC AB 5-0 PS2 18 (SUTURE) IMPLANT
SYR CONTROL 10ML LL (SYRINGE) ×2 IMPLANT
TOWEL GREEN STERILE FF (TOWEL DISPOSABLE) ×2 IMPLANT
TRACER MAGTRACE VIAL (MISCELLANEOUS) ×1 IMPLANT
TRAY FAXITRON CT DISP (TRAY / TRAY PROCEDURE) ×2 IMPLANT
TUBE CONNECTING 20X1/4 (TUBING) IMPLANT
YANKAUER SUCT BULB TIP NO VENT (SUCTIONS) IMPLANT

## 2022-06-19 NOTE — Discharge Instructions (Addendum)
[facility] Phone Number [phone]  POST OP INSTRUCTIONS Take 400 mg of ibuprofen every 8 hours or 650 mg tylenol every 6 hours for next 72 hours then as needed. Use ice several times daily also.   A prescription for pain medication may be given to you upon discharge.  Take your pain medication as prescribed, if needed.  If narcotic pain medicine is not needed, then you may take acetaminophen (Tylenol), naprosyn (Alleve) or ibuprofen (Advil) as needed. Take your usually prescribed medications unless otherwise directed If you need a refill on your pain medication, please contact your pharmacy.  They will contact our office to request authorization.  Prescriptions will not be filled after 5pm or on week-ends. You should eat very light the first 24 hours after surgery, such as soup, crackers, pudding, etc.  Resume your normal diet the day after surgery. Most patients will experience some swelling and bruising in the breast.  Ice packs and a good support bra will help.  Wear the breast binder provided or a sports bra for 72 hours day and night.  After that wear a sports bra during the day until you return to the office. Swelling and bruising can take several days to resolve.  It is common to experience some constipation if taking pain medication after surgery.  Increasing fluid intake and taking a stool softener will usually help or prevent this problem from occurring.  A mild laxative (Milk of Magnesia or Miralax) should be taken according to package directions if there are no bowel movements after 48 hours. I used skin glue on the incision, you may shower in 24 hours.  The glue will flake off over the next 2-3 weeks.  Any sutures or staples will be removed at the office during your follow-up visit. ACTIVITIES:  You may resume regular daily activities (gradually increasing) beginning the next day.  Wearing a good support bra or sports bra minimizes pain and swelling.  You may  have sexual intercourse when it is comfortable. You may drive when you no longer are taking prescription pain medication, you can comfortably wear a seatbelt, and you can safely maneuver your car and apply brakes. RETURN TO WORK:  ______________________________________________________________________________________ Dennis Bast should see your doctor in the office for a follow-up appointment approximately two weeks after your surgery.  Your doctor's nurse will typically make your follow-up appointment when she calls you with your pathology report.  Expect your pathology report 3-4 business days after your surgery.  You may call to check if you do not hear from [place] after three days. OTHER INSTRUCTIONS: _______________________________________________________________________________________________ _____________________________________________________________________________________________________________________________________ _____________________________________________________________________________________________________________________________________ _____________________________________________________________________________________________________________________________________  WHEN TO CALL DR WAKEFIELD: Fever over 101.0 Nausea and/or vomiting. Extreme swelling or bruising. Continued bleeding from incision. Increased pain, redness, or drainage from the incision.  The clinic staff is available to answer your questions during regular business hours.  Please don't hesitate to call and ask to speak to one of the nurses for clinical concerns.  If you have a medical emergency, go to the nearest emergency room or call 911.  A surgeon from [facility] is always on call at the hospital.  For further questions, please visit [email or web address]  Post Anesthesia Home Care Instructions  Activity: Get plenty of rest for the remainder of the day. A responsible individual must stay  with you for 24 hours following the procedure.  For the next 24 hours, DO NOT: -Drive a car -Paediatric nurse -Drink alcoholic beverages -Take any medication unless instructed by  your physician -Make any legal decisions or sign important papers.  Meals: Start with liquid foods such as gelatin or soup. Progress to regular foods as tolerated. Avoid greasy, spicy, heavy foods. If nausea and/or vomiting occur, drink only clear liquids until the nausea and/or vomiting subsides. Call your physician if vomiting continues.  Special Instructions/Symptoms: Your throat may feel dry or sore from the anesthesia or the breathing tube placed in your throat during surgery. If this causes discomfort, gargle with warm salt water. The discomfort should disappear within 24 hours.      Next time you can take Ibuprofen or Motrin can be at 6pm today if needed. Next time you can take tylenol today can be at 6pm today if needed.

## 2022-06-19 NOTE — Op Note (Signed)
Signed       Preoperative diagnosis: Right breast cancer s/p primary chemotherapy Postoperative diagnosis: Same as above Procedure: 1.  Right breast radioactive seed guided lumpectomy 2.  Right deep axillary sentinel lymph node biopsy 3.  Injection of mag trace and blue dye for sentinel lymph node identification Surgeon: Dr. Serita Grammes Anesthesia: General with a pectoral block Estimated blood loss: Minimal Complications: None Drains: None Specimens: 1.  Right breast lumpectomy containing seed and clip marked with paint 2.  Right deep axillary sentinel lymph nodes with highest count of 1987 3.  Right breast superior, medial,  and posterior margins marked short superior, long lateral and double deep Sponge needle count was correct at completion Decision to recovery stable condition  Indications: 46 year old female with no prior history and no family history who presented without any discharge or any mass. She underwent a screening mammogram that shows C density breast she has a 2.1 cm x 2.1 cm x 0.9 cm right breast mass with associated calcifications. Her axillary ultrasound is negative. 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 since then genetic testing has been negative. She underwent initial mri that shows a 5.1 cm area in the right breast. There was also a 6 mm mass in the left breast that biopsy showed an intraductal papilloma. She has almost completed TCHP which she has tolerated well. She has finished now.repeat mri shows complete imaging response on the right side. Nothing on left side. Nodes all still normal.   Procedure: She first had a seed placed by radiology.  I had these mammograms available for my review.  After informed consent was obtained she then underwent a pectoral block. I also prepped the area lateral to the areola.  I injected 2 cc of mag trace in the subareolar position without complication preoperatively. She  was given antibiotics.  SCDs were placed.  She was then placed under general anesthesia without complication.  She was prepped and draped in the standard sterile surgical fashion.  A surgical timeout was then performed.  I injected 3 cc of methylene blue prior to beginning.  I identified the seed in the lateral breast.  I made a periareolar incision to hide the scar later. I then dissected to the seed. I removed the seed and the surrounding tissue with an attempt to get a clear margin. Mammogram confirmed removal of the seed and the clip.  I removed additional margins as above. The posterior margin is the muscle. Hemostasis was observed. I closed this with 2-0 vicryl for the breast tissue. I did place clips in the cavity. I closed the skin with 3-0 vicryl and 5-0 monocryl. Glue and sterstrips were applied.    I made a curvilinear incision below the axillary hairline. I then entered the axilla.  I used the probe to identify what appeared to be a couple normal-appearing nodes that contained magtrace and were brown stained.  There was no other activity once I was completed this.  These were then passed off the table.  Hemostasis was observed. I closed the axillary fascia with 2-0 vicryl.  I then closed skin with 3-0 vicryl and 4-0 monocryl. Glue was placed.   she tolerated well, was extubated and transferred to recovery stable

## 2022-06-19 NOTE — Transfer of Care (Signed)
Immediate Anesthesia Transfer of Care Note  Patient: Tonya Blair  Procedure(s) Performed: RADIOACTIVE SEED GUIDED RIGHT BREAST LUMPECTOMY, RIGHT AXILLARY SENTINEL LYMPH NODE BIOPSY (Right: Breast)  Patient Location: PACU  Anesthesia Type:GA combined with regional for post-op pain  Level of Consciousness: awake  Airway & Oxygen Therapy: Patient Spontanous Breathing and Patient connected to face mask oxygen  Post-op Assessment: Report given to RN and Post -op Vital signs reviewed and stable  Post vital signs: Reviewed and stable  Last Vitals:  Vitals Value Taken Time  BP    Temp    Pulse 97 06/19/22 1025  Resp 17 06/19/22 1025  SpO2 98 % 06/19/22 1025  Vitals shown include unvalidated device data.  Last Pain:  Vitals:   06/19/22 0657  TempSrc: Oral  PainSc: 0-No pain      Patients Stated Pain Goal: 6 (82/06/01 5615)  Complications: No notable events documented.

## 2022-06-19 NOTE — H&P (Signed)
46 year old female with no prior history and no family history who presented without any discharge or any mass. She underwent a screening mammogram that shows C density breast she has a 2.1 cm x 2.1 cm x 0.9 cm right breast mass with associated calcifications. Her axillary ultrasound is negative. 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 since then genetic testing has been negative. She underwent initial mri that shows a 5.1 cm area in the right breast. There was also a 6 mm mass in the left breast that biopsy showed an intraductal papilloma. She has almost completed TCHP which she has tolerated well. She has done the dignicap. She has finished now.repeat mri shows complete imaging response on the right side. Nothing on left side. Nodes all still normal.     Review of Systems: A complete review of systems was obtained from the patient. I have reviewed this information and discussed as appropriate with the patient. See HPI as well for other ROS.  Review of Systems  All other systems reviewed and are negative.   Medical History: History reviewed. No pertinent past medical history.  Patient Active Problem List  Diagnosis   Malignant neoplasm of upper-outer quadrant of right breast in female, estrogen receptor positive (CMS-HCC)   History reviewed. No pertinent surgical history.   Allergies  Allergen Reactions   Codeine Vomiting   Family History  Problem Relation Age of Onset   Multiple sclerosis Mother   Diabetes Father   Prostate cancer Father   Deep vein thrombosis (DVT or abnormal blood clot formation) Brother    Social History   Tobacco Use  Smoking Status Never  Socioeconomic History   Marital status: Unknown  Tobacco Use   Smoking status: Never  Substance and Sexual Activity   Alcohol use: Yes   Drug use: Not Currently   Objective:   Physical Exam Vitals reviewed.  Constitutional:  Appearance: Normal  appearance.  Chest:  Breasts: Right: No inverted nipple, mass or nipple discharge.  Left: No inverted nipple, mass or nipple discharge.  Lymphadenopathy:  Upper Body:  Right upper body: No supraclavicular or axillary adenopathy.  Left upper body: No supraclavicular or axillary adenopathy.  Neurological:  Mental Status: She is alert.     Assessment and Plan:    Malignant neoplasm of upper-outer quadrant of right breast in female, estrogen receptor positive (CMS-HCC)  right breast seed guided lumpectomy, right ax sn biopsy   We discussed the staging and pathophysiology of breast cancer. We discussed all of the different options for treatment for breast cancer including surgery, chemotherapy, radiation therapy, Herceptin, and antiestrogen therapy. We discussed a sentinel lymph node biopsy as she does not appear to having lymph node involvement right now. We discussed the performance of that with injection of radioactive tracer. We discussed that there is a chance of having a positive node with a sentinel lymph node biopsy and we will await the permanent pathology to make any other first further decisions in terms of her treatment. We discussed up to a 5% risk lifetime of chronic shoulder pain as well as lymphedema associated with a sentinel lymph node biopsy. We discussed the options for treatment of the breast cancer which included lumpectomy versus a mastectomy. We discussed the performance of the lumpectomy with radioactive seed placement. We discussed a 5-10% chance of a positive margin requiring reexcision in the operating room. We also discussed that she will likely need radiation therapy if she  undergoes lumpectomy. We discussed mastectomy and the postoperative care for that as well. Mastectomy can be followed by reconstruction. The decision for lumpectomy vs mastectomy has no impact on decision for chemotherapy. Most mastectomy patients will not need radiation therapy. We discussed that  there is no difference in her survival whether she undergoes lumpectomy with radiation therapy or antiestrogen therapy versus a mastectomy. There is also no real difference between her recurrence in the breast. We discussed the risks of operation including bleeding, infection, possible reoperation. She understands her further therapy will be based on what her stages at the time of her operation. I discussed left sided papilloma with radiology and we think this was completely excised before so will not proceed with anything on left.

## 2022-06-19 NOTE — Progress Notes (Signed)
Magtrace injection by Dr Donne Hazel

## 2022-06-19 NOTE — Anesthesia Procedure Notes (Signed)
Anesthesia Regional Block: Pectoralis block   Pre-Anesthetic Checklist: , timeout performed,  Correct Patient, Correct Site, Correct Laterality,  Correct Procedure, Correct Position, site marked,  Risks and benefits discussed,  Surgical consent,  Pre-op evaluation,  At surgeon's request and post-op pain management  Laterality: Right  Prep: chloraprep       Needles:  Injection technique: Single-shot      Needle Length: 9cm  Needle Gauge: 22     Additional Needles: Arrow StimuQuik ECHO Echogenic Stimulating PNB Needle  Procedures:,,,, ultrasound used (permanent image in chart),,    Narrative:  Start time: 06/19/2022 7:52 AM End time: 06/19/2022 8:02 AM Injection made incrementally with aspirations every 5 mL.  Performed by: Personally  Anesthesiologist: Oleta Mouse, MD

## 2022-06-19 NOTE — Anesthesia Procedure Notes (Signed)
Procedure Name: LMA Insertion Date/Time: 06/19/2022 9:19 AM  Performed by: Tawni Millers, CRNAPre-anesthesia Checklist: Patient identified, Emergency Drugs available, Suction available and Patient being monitored Patient Re-evaluated:Patient Re-evaluated prior to induction Oxygen Delivery Method: Circle system utilized Preoxygenation: Pre-oxygenation with 100% oxygen Induction Type: IV induction Ventilation: Mask ventilation without difficulty LMA: LMA inserted LMA Size: 3.0 Number of attempts: 1 Airway Equipment and Method: Bite block Placement Confirmation: positive ETCO2 Tube secured with: Tape Dental Injury: Teeth and Oropharynx as per pre-operative assessment

## 2022-06-19 NOTE — Anesthesia Preprocedure Evaluation (Addendum)
Anesthesia Evaluation  Patient identified by MRN, date of birth, ID band Patient awake    Reviewed: Allergy & Precautions, NPO status , Patient's Chart, lab work & pertinent test results  History of Anesthesia Complications Negative for: history of anesthetic complications  Airway Mallampati: II  TM Distance: >3 FB Neck ROM: Full    Dental  (+) Dental Advisory Given, Teeth Intact   Pulmonary    breath sounds clear to auscultation       Cardiovascular negative cardio ROS   Rhythm:Regular     Neuro/Psych negative neurological ROS  negative psych ROS   GI/Hepatic negative GI ROS, Neg liver ROS,   Endo/Other  negative endocrine ROS  Renal/GU negative Renal ROS     Musculoskeletal negative musculoskeletal ROS (+)   Abdominal   Peds  Hematology  (+) Blood dyscrasia, anemia , Lab Results      Component                Value               Date                      WBC                      5.9                 05/21/2022                HGB                      8.1 (L)             05/21/2022                HCT                      25.5 (L)            05/21/2022                MCV                      91.4                05/21/2022                PLT                      239                 05/21/2022              Anesthesia Other Findings   Reproductive/Obstetrics Lab Results      Component                Value               Date                      PREGTESTUR               NEGATIVE            06/19/2022  Anesthesia Physical Anesthesia Plan  ASA: 2  Anesthesia Plan: General   Post-op Pain Management: Regional block*, Ofirmev IV (intra-op)* and Toradol IV (intra-op)*   Induction: Intravenous  PONV Risk Score and Plan: 3 and Propofol infusion, TIVA, Midazolam, Ondansetron and Dexamethasone  Airway Management Planned: LMA  Additional Equipment:  None  Intra-op Plan:   Post-operative Plan: Extubation in OR  Informed Consent: I have reviewed the patients History and Physical, chart, labs and discussed the procedure including the risks, benefits and alternatives for the proposed anesthesia with the patient or authorized representative who has indicated his/her understanding and acceptance.     Dental advisory given  Plan Discussed with: CRNA  Anesthesia Plan Comments:        Anesthesia Quick Evaluation

## 2022-06-19 NOTE — Interval H&P Note (Signed)
History and Physical Interval Note:  06/19/2022 8:16 AM  Tonya Blair  has presented today for surgery, with the diagnosis of RIGHT BREAST CANCER.  The various methods of treatment have been discussed with the patient and family. After consideration of risks, benefits and other options for treatment, the patient has consented to  Procedure(s) with comments: RIGHT BREAST LUMPECTOMY WITH RADIOACTIVE SEED, RIGHT AXILLARY SENTINEL NODE BIOPSY (Right) - GEN & PEC BLOCK as a surgical intervention.  The patient's history has been reviewed, patient examined, no change in status, stable for surgery.  I have reviewed the patient's chart and labs.  Questions were answered to the patient's satisfaction.     Rolm Bookbinder

## 2022-06-19 NOTE — Progress Notes (Signed)
Assisted Dr. Ermalene Postin with right, pectoralis block. Side rails up, monitors on throughout procedure. See vital signs in flow sheet. Tolerated Procedure well.

## 2022-06-19 NOTE — Anesthesia Postprocedure Evaluation (Signed)
Anesthesia Post Note  Patient: Tonya Blair  Procedure(s) Performed: RADIOACTIVE SEED GUIDED RIGHT BREAST LUMPECTOMY, RIGHT AXILLARY SENTINEL LYMPH NODE BIOPSY (Right: Breast)     Patient location during evaluation: PACU Anesthesia Type: General and Regional Level of consciousness: awake and alert Pain management: pain level controlled Vital Signs Assessment: post-procedure vital signs reviewed and stable Respiratory status: spontaneous breathing, nonlabored ventilation and respiratory function stable Cardiovascular status: blood pressure returned to baseline and stable Postop Assessment: no apparent nausea or vomiting Anesthetic complications: no   No notable events documented.  Last Vitals:  Vitals:   06/19/22 1045 06/19/22 1100  BP: 109/68 126/77  Pulse: 81 84  Resp: 12 16  Temp:  (!) 36.3 C  SpO2: 96% 95%    Last Pain:  Vitals:   06/19/22 1100  TempSrc:   PainSc: 0-No pain                 Mitchelle Goerner

## 2022-06-20 ENCOUNTER — Encounter (HOSPITAL_BASED_OUTPATIENT_CLINIC_OR_DEPARTMENT_OTHER): Payer: Self-pay | Admitting: General Surgery

## 2022-06-20 LAB — SURGICAL PATHOLOGY

## 2022-06-26 ENCOUNTER — Ambulatory Visit (HOSPITAL_COMMUNITY)
Admission: RE | Admit: 2022-06-26 | Discharge: 2022-06-26 | Disposition: A | Discharge status: Home / Self Care | Payer: 59 | Source: Ambulatory Visit | Attending: Hematology and Oncology | Admitting: Hematology and Oncology

## 2022-06-26 ENCOUNTER — Encounter: Payer: Self-pay | Admitting: *Deleted

## 2022-06-26 DIAGNOSIS — Z170 Estrogen receptor positive status [ER+]: Secondary | ICD-10-CM | POA: Diagnosis not present

## 2022-06-26 DIAGNOSIS — C50411 Malignant neoplasm of upper-outer quadrant of right female breast: Secondary | ICD-10-CM | POA: Insufficient documentation

## 2022-06-26 DIAGNOSIS — Z0189 Encounter for other specified special examinations: Secondary | ICD-10-CM | POA: Diagnosis not present

## 2022-06-26 DIAGNOSIS — Z01818 Encounter for other preprocedural examination: Secondary | ICD-10-CM | POA: Insufficient documentation

## 2022-06-26 LAB — ECHOCARDIOGRAM COMPLETE
Area-P 1/2: 2.16 cm2
S' Lateral: 3 cm

## 2022-06-26 NOTE — Progress Notes (Signed)
  Echocardiogram 2D Echocardiogram has been performed.  Darlina Sicilian M 06/26/2022, 9:37 AM

## 2022-06-27 ENCOUNTER — Encounter (HOSPITAL_COMMUNITY): Payer: Self-pay

## 2022-06-27 ENCOUNTER — Other Ambulatory Visit: Payer: Self-pay | Admitting: *Deleted

## 2022-06-27 ENCOUNTER — Telehealth: Payer: Self-pay | Admitting: *Deleted

## 2022-06-27 DIAGNOSIS — C50411 Malignant neoplasm of upper-outer quadrant of right female breast: Secondary | ICD-10-CM

## 2022-06-27 DIAGNOSIS — R931 Abnormal findings on diagnostic imaging of heart and coronary circulation: Secondary | ICD-10-CM

## 2022-06-27 DIAGNOSIS — Z170 Estrogen receptor positive status [ER+]: Secondary | ICD-10-CM

## 2022-06-27 NOTE — Telephone Encounter (Addendum)
-----   Message from Benay Pike, MD sent at 06/27/2022  1:41 PM EDT ----- Tivis Ringer  We may have to send her to see Dr Aundra Dubin or Dr Haroldine Laws, her EF dropped a bit. Can we send a referral and let the patient know, this is just to monitor the heart function.  Thanks,  This RN contacted pt and discussed recent echo with mild decrease in EF%. Discussed what this means and likely secondary to chemotherapy.  Discussed MD recommendation for evaluation by one of the cardiologist - at [facility] - Dr Aundra Dubin or Dr Haroldine Laws.  Questions answered- including she does not have cardiac failure but needs cardiac monitoring for best outcome.  Referral sent.

## 2022-06-28 NOTE — Telephone Encounter (Signed)
Appt scheduled

## 2022-07-02 ENCOUNTER — Encounter: Payer: Self-pay | Admitting: Hematology and Oncology

## 2022-07-02 ENCOUNTER — Encounter (HOSPITAL_COMMUNITY): Payer: Self-pay | Admitting: Internal Medicine

## 2022-07-02 ENCOUNTER — Other Ambulatory Visit: Payer: Self-pay | Admitting: Hematology and Oncology

## 2022-07-02 ENCOUNTER — Ambulatory Visit (HOSPITAL_COMMUNITY)
Admission: RE | Admit: 2022-07-02 | Discharge: 2022-07-02 | Disposition: A | Discharge status: Home / Self Care | Payer: 59 | Source: Ambulatory Visit | Attending: Internal Medicine | Admitting: Internal Medicine

## 2022-07-02 VITALS — BP 108/64 | HR 94 | Wt 186.0 lb

## 2022-07-02 DIAGNOSIS — T451X5A Adverse effect of antineoplastic and immunosuppressive drugs, initial encounter: Secondary | ICD-10-CM | POA: Diagnosis not present

## 2022-07-02 DIAGNOSIS — Z79899 Other long term (current) drug therapy: Secondary | ICD-10-CM | POA: Insufficient documentation

## 2022-07-02 DIAGNOSIS — Z9221 Personal history of antineoplastic chemotherapy: Secondary | ICD-10-CM | POA: Diagnosis not present

## 2022-07-02 DIAGNOSIS — I427 Cardiomyopathy due to drug and external agent: Secondary | ICD-10-CM | POA: Diagnosis not present

## 2022-07-02 DIAGNOSIS — Z170 Estrogen receptor positive status [ER+]: Secondary | ICD-10-CM | POA: Diagnosis not present

## 2022-07-02 DIAGNOSIS — C50411 Malignant neoplasm of upper-outer quadrant of right female breast: Secondary | ICD-10-CM | POA: Diagnosis not present

## 2022-07-02 MED ORDER — METOPROLOL SUCCINATE ER 25 MG PO TB24: 25.0000 mg | ORAL_TABLET | Freq: Every day | ORAL | 3 refills | Status: DC

## 2022-07-02 MED ORDER — LOSARTAN POTASSIUM 25 MG PO TABS: 25.0000 mg | ORAL_TABLET | Freq: Every day | ORAL | 3 refills | Status: DC

## 2022-07-02 NOTE — Progress Notes (Signed)
CARDIO-ONCOLOGY CLINIC CONSULT NOTE  Referring Physician: Dr. Chryl Heck Primary Care: Pcp, No Primary Cardiologist: New  HPI:  Tonya Blair is a 46 y.o. female with right  breast cancer referred by Dr. Chryl Heck for enrollment into the Cardio-Oncology program.   Oncology History  Malignant neoplasm of upper-outer quadrant of right breast in female, estrogen receptor positive ([place])  12/31/2021 Initial Diagnosis    Malignant neoplasm of upper-outer quadrant of right breast in female, estrogen receptor positive ([place])      01/02/2022 Cancer Staging    Staging form: Breast, AJCC 8th Edition - Clinical stage from 01/02/2022: Stage IB (cT2, cN0, cM0, G3, ER+, PR+, HER2+) - Signed by Benay Pike, MD on 01/02/2022 Histologic grading system: 3 grade system      01/14/2022 Genetic Testing    Negative hereditary cancer genetic testing: no pathogenic variants detected in [facility] CustomNext-Cancer +RNAinsight Panel.  The report date is January 14, 2022.   The CustomNext-Cancer+RNAinsight panel offered by [facility] includes sequencing and rearrangement analysis for the following 47 genes:  APC, ATM, AXIN2, BARD1, BMPR1A, BRCA1, BRCA2, BRIP1, CDH1, CDK4, CDKN2A, CHEK2, DICER1, EPCAM, GREM1, HOXB13, MEN1, MLH1, MSH2, MSH3, MSH6, MUTYH, NBN, NF1, NF2, NTHL1, PALB2, PMS2, POLD1, POLE, PTEN, RAD51C, RAD51D, RECQL, RET, SDHA, SDHAF2, SDHB, SDHC, SDHD, SMAD4, SMARCA4, STK11, TP53, TSC1, TSC2, and VHL.  RNA data is routinely analyzed for use in variant interpretation for all genes.    01/16/2022 -  Chemotherapy    Patient is on Treatment Plan : BREAST  Docetaxel + Carboplatin + Trastuzumab + Pertuzumab  (TCHP) q21d            ONCOLOGIC HISTORY: 1) 12/11/2021: Diagnostic mammogram and ultrasound showed suspicious mass in the 9 o'clock region of the right breast. No evidence of enlarged adenopathy.   2) 12/25/2021: Underwent US guided right breast core needle biopsy of breast mass at 9 o'clock. Pathology showed  invasive ductal carcinoma grade 3, ductal carcinoma in situ and calcifications, 5 cm from the nipple.  Prognostics from this mass showed ER 70% moderate, PR 20% moderate, HER2 positive by IHC, Ki-67 of 30%.   3) 01/09/2022:  MR Breast Bilateral: Biopsy proven malignancy in the outer right breast with associated linear/nodular enhancement extending anteriorly altogether measuring up to 5.1 cm. Indeterminate 0.6 cm tubular enhancing mass in the outer left breast.   4) 01/16/2022: Initiated neoadjuvant therapy with TCHP q 21 days given T2 tumor with HER2 amplification.    5) 01/17/2022: Underwent MRI guided biopsy of indeterminate 6 mm enhancing mass over the left outer lower quadrant. 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 No evidence of malignancy.    6) Completed neoadjuvant chemotherapy TCHP from 01/16/2022 to 05/01/2022  Got H/P on 5/30 and further dosing stopped due to reduced EF on echo .   Active. Walking, lift weights. No h/o heart problems, HTN.   ECHO: 12/29/21 EF 60-65% GLS -22.5% 03/29/22 EF 60-65% GLS -19.7 % (underestimated on 3-chamber)  06/26/22 EF 53% GLS -18.8%    Review of Systems: [y] = yes, [ ]  = no   General: Weight gain [ ] ; Weight loss [ ] ; Anorexia [ ] ; Fatigue [ ] ; Fever [ ] ; Chills [ ] ; Weakness [ ]   Cardiac: Chest pain/pressure [ ] ; Resting SOB [ ] ; Exertional SOB [ ] ; Orthopnea [ ] ; Pedal Edema [ ] ; Palpitations [ ] ; Syncope [ ] ; Presyncope [ ] ; Paroxysmal nocturnal dyspnea[ ]   Pulmonary: Cough [ ] ; Wheezing[ ] ; Hemoptysis[ ] ; Sputum [ ] ; Snoring [ ]   GI: Vomiting[ ] ; Dysphagia[ ] ; Melena[ ] ; Hematochezia [ ] ; Heartburn[ ] ; Abdominal pain [ ] ; Constipation [ ] ; Diarrhea [ ] ; BRBPR [ ]   GU: Hematuria[ ] ; Dysuria [ ] ; Nocturia[ ]   Vascular: Pain in legs with walking [ ] ; Pain in feet with lying flat [ ] ; Non-healing sores [ ] ; Stroke [ ] ; TIA [ ] ; Slurred speech [ ] ;  Neuro: Headaches[ ] ; Vertigo[ ] ; Seizures[ ] ; Paresthesias[ ] ;Blurred vision  [ ] ; Diplopia [ ] ; Vision changes [ ]   Ortho/Skin: Arthritis [ ] ; Joint pain [ ] ; Muscle pain [ ] ; Joint swelling [ ] ; Back Pain [ ] ; Rash [ ]   Psych: Depression[ ] ; Anxiety[ ]   Heme: Bleeding problems [ ] ; Clotting disorders [ ] ; Anemia [ ]   Endocrine: Diabetes [ ] ; Thyroid dysfunction[ ]    Past Medical History:  Diagnosis Date   Anemia    IDA   Cancer ([place])    right breast cancer   Family history of prostate cancer 01/02/2022    Current Outpatient Medications  Medication Sig Dispense Refill   Ascorbic Acid (VITAMIN C PO) Take 1 tablet by mouth in the morning.     BIOTIN PO Take 1 tablet by mouth in the morning.     Cholecalciferol (VITAMIN D3 PO) Take 1 tablet by mouth in the morning.     COD LIVER OIL PO Take by mouth.     dexamethasone (DECADRON) 4 MG tablet Take 2 tablets (8 mg total) by mouth 2 (two) times daily. Start the day before Taxotere. Then take daily x 3 days after chemotherapy. 30 tablet 1   ibuprofen (ADVIL) 200 MG tablet Take 200 mg by mouth every 8 (eight) hours as needed (pain.).     levonorgestrel (MIRENA, 52 MG,) 20 MCG/DAY IUD Mirena 20 mcg/24 hours (8 yrs) 52 mg intrauterine device     lidocaine-prilocaine (EMLA) cream Apply to affected area once 30 g 3   LORazepam (ATIVAN) 0.5 MG tablet Take 1 tablet (0.5 mg total) by mouth every 6 (six) hours as needed (Nausea or vomiting). 30 tablet 0   Multiple Minerals-Vitamins (CAL MAG ZINC +D3 PO) Take 1 tablet by mouth in the morning.     ondansetron (ZOFRAN) 8 MG tablet Take 1 tablet (8 mg total) by mouth 2 (two) times daily as needed (Nausea or vomiting). Start on the third day after chemotherapy. 30 tablet 1   OVER THE COUNTER MEDICATION Take 1 capsule by mouth in the morning. Sea Moss Superfood Capsule     oxyCODONE (OXY IR/ROXICODONE) 5 MG immediate release tablet Take 1 tablet (5 mg total) by mouth every 6 (six) hours as needed. 10 tablet 0   prochlorperazine (COMPAZINE) 10 MG tablet Take 1 tablet (10 mg total) by  mouth every 6 (six) hours as needed (Nausea or vomiting). 30 tablet 1   traMADol (ULTRAM) 50 MG tablet Take 2 tablets (100 mg total) by mouth every 6 (six) hours as needed. 10 tablet 0   Turmeric (QC TUMERIC COMPLEX PO) Take by mouth.     No current facility-administered medications for this encounter.    Allergies  Allergen Reactions   Codeine Nausea And Vomiting   Kiwi Extract Itching and Swelling      Social History   Socioeconomic History   Marital status: Single    Spouse name: Not on file   Number of children: Not on file   Years of education: Not on file   Highest education level: Not on file  Occupational History  Not on file  Tobacco Use   Smoking status: Never   Smokeless tobacco: Never  Vaping Use   Vaping Use: Never used  Substance and Sexual Activity   Alcohol use: Yes    Comment: occasional wine   Drug use: Never   Sexual activity: Yes    Birth control/protection: I.U.D.    Comment: Mirena IUD  Other Topics Concern   Not on file  Social History Narrative   Not on file   Social Determinants of Health   Financial Resource Strain: Low Risk  (01/02/2022)   Overall Financial Resource Strain (CARDIA)    Difficulty of Paying Living Expenses: Not hard at all  Food Insecurity: No Food Insecurity (01/02/2022)   Hunger Vital Sign    Worried About Running Out of Food in the Last Year: Never true    Ran Out of Food in the Last Year: Never true  Transportation Needs: No Transportation Needs (01/02/2022)   PRAPARE - Hydrologist (Medical): No    Lack of Transportation (Non-Medical): No  Physical Activity: Not on file  Stress: Not on file  Social Connections: Not on file  Intimate Partner Violence: Not on file      Family History  Problem Relation Age of Onset   Prostate cancer Father 46    Vitals:   07/02/22 1107  BP: 108/64  Pulse: 94  SpO2: 97%  Weight: 84.4 kg (186 lb)    PHYSICAL EXAM: General:  Well appearing. No  respiratory difficulty HEENT: normal Neck: supple. no JVD. Carotids 2+ bilat; no bruits. No lymphadenopathy or thryomegaly appreciated. Cor: PMI nondisplaced. Regular rate & rhythm. No rubs, gallops or murmurs. Lungs: clear Abdomen: soft, nontender, nondistended. No hepatosplenomegaly. No bruits or masses. Good bowel sounds. Extremities: no cyanosis, clubbing, rash, edema Neuro: alert & oriented x 3, cranial nerves grossly intact. moves all 4 extremities w/o difficulty. Affect pleasant.    ASSESSMENT & PLAN:  1. 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 Diagnosed 1/23  - Completed neoadjuvant chemotherapy TCHP from 01/16/2022 to 05/01/2022 - Scheduled for H/P q21 days. Received 1 dose 05/21/22 -> stopped due to decreased EF - Echo 12/29/21 EF 60-65% GLS -22.5% - Echo 03/29/22 EF 60-65% GLS -19.7 % (underestimated on 3-chamber)  - Echo 06/26/22 EF 53% GLS -18.8% - I have reviewed echos personally. I suspect she does have mild herceptin/perjeta cardiotoxicity - Explained incidence of Herceptin cardiotoxicity and role of Cardio-oncology clinic at length. - Will start Toprol 25 and losartan 25.  - OK to give next 2 doses of H/P repeat echo in 6 weeks. If EF declining further can have a scheduled interruption. Otherwise continue immunotherapy.    Glori Bickers, MD  12:51 PM   Glori Bickers, MD  11:35 AM

## 2022-07-02 NOTE — Patient Instructions (Signed)
Medication Changes:  START losartan ('25mg'$ ) one tab daily.  START toprol XL ('25mg'$ ) one tab daily.  Testing/Procedures:  Your physician has requested that you have an echocardiogram. Echocardiography is a painless test that uses sound waves to create images of your heart. It provides your doctor with information about the size and shape of your heart and how well your heart's chambers and valves are working. This procedure takes approximately one hour. There are no restrictions for this procedure.  Follow-Up in: 6 weeks with ECHO      At the [facility], you and your health needs are our priority. We have a designated team specialized in the treatment of Heart Failure. This Care Team includes your primary Heart Failure Specialized Cardiologist (physician), Advanced Practice Providers (APPs- Physician Assistants and Nurse Practitioners), and Pharmacist who all work together to provide you with the care you need, when you need it.   You may see any of the following providers on your designated Care Team at your next follow up:  Dr Glori Bickers Dr Haynes Kerns, NP Lyda Jester, [place] [facility] [place], [place] Audry Riles, PharmD   Please be sure to bring in all your medications bottles to every appointment.   Need to Contact us:  If you have any questions or concerns before your next appointment please send [place] a message through [place] or call our office at [phone].    TO LEAVE A MESSAGE FOR THE NURSE SELECT OPTION 2, PLEASE LEAVE A MESSAGE INCLUDING: YOUR NAME DATE OF BIRTH CALL BACK NUMBER REASON FOR CALL**this is important as we prioritize the call backs  YOU WILL RECEIVE A CALL BACK THE SAME DAY AS LONG AS YOU CALL BEFORE 4:00 PM

## 2022-07-03 ENCOUNTER — Telehealth: Payer: Self-pay | Admitting: *Deleted

## 2022-07-03 ENCOUNTER — Other Ambulatory Visit: Payer: Self-pay

## 2022-07-03 ENCOUNTER — Telehealth: Payer: Self-pay | Admitting: Hematology and Oncology

## 2022-07-03 ENCOUNTER — Inpatient Hospital Stay: Payer: 59

## 2022-07-03 ENCOUNTER — Inpatient Hospital Stay: Payer: 59 | Attending: Hematology and Oncology | Admitting: Hematology and Oncology

## 2022-07-03 ENCOUNTER — Encounter: Payer: Self-pay | Admitting: Hematology and Oncology

## 2022-07-03 DIAGNOSIS — Z5112 Encounter for antineoplastic immunotherapy: Secondary | ICD-10-CM | POA: Insufficient documentation

## 2022-07-03 DIAGNOSIS — Z170 Estrogen receptor positive status [ER+]: Secondary | ICD-10-CM

## 2022-07-03 DIAGNOSIS — Z95828 Presence of other vascular implants and grafts: Secondary | ICD-10-CM

## 2022-07-03 DIAGNOSIS — C50411 Malignant neoplasm of upper-outer quadrant of right female breast: Secondary | ICD-10-CM

## 2022-07-03 DIAGNOSIS — Z79899 Other long term (current) drug therapy: Secondary | ICD-10-CM | POA: Insufficient documentation

## 2022-07-03 LAB — COMPREHENSIVE METABOLIC PANEL
ALT: 15 U/L (ref 0–44)
AST: 17 U/L (ref 15–41)
Albumin: 4.1 g/dL (ref 3.5–5.0)
Alkaline Phosphatase: 75 U/L (ref 38–126)
Anion gap: 5 (ref 5–15)
BUN: 19 mg/dL (ref 6–20)
CO2: 29 mmol/L (ref 22–32)
Calcium: 9.9 mg/dL (ref 8.9–10.3)
Chloride: 107 mmol/L (ref 98–111)
Creatinine, Ser: 0.99 mg/dL (ref 0.44–1.00)
GFR, Estimated: >60 mL/min (ref >60)
Glucose, Bld: 105 mg/dL — ABNORMAL HIGH (ref 70–99)
Potassium: 3.8 mmol/L (ref 3.5–5.1)
Sodium: 141 mmol/L (ref 135–145)
Total Bilirubin: 0.3 mg/dL (ref 0.3–1.2)
Total Protein: 7 g/dL (ref 6.5–8.1)

## 2022-07-03 LAB — CBC WITH DIFFERENTIAL/PLATELET
Abs Immature Granulocytes: 0.01 10*3/uL (ref 0.00–0.07)
Basophils Absolute: 0 10*3/uL (ref 0.0–0.1)
Basophils Relative: 0 %
Eosinophils Absolute: 0.3 10*3/uL (ref 0.0–0.5)
Eosinophils Relative: 5 %
HCT: 31.2 % — ABNORMAL LOW (ref 36.0–46.0)
Hemoglobin: 10.1 g/dL — ABNORMAL LOW (ref 12.0–15.0)
Immature Granulocytes: 0 %
Lymphocytes Relative: 40 %
Lymphs Abs: 2.5 10*3/uL (ref 0.7–4.0)
MCH: 27.9 pg (ref 26.0–34.0)
MCHC: 32.4 g/dL (ref 30.0–36.0)
MCV: 86.2 fL (ref 80.0–100.0)
Monocytes Absolute: 0.6 10*3/uL (ref 0.1–1.0)
Monocytes Relative: 10 %
Neutro Abs: 2.8 10*3/uL (ref 1.7–7.7)
Neutrophils Relative %: 45 %
Platelets: 304 10*3/uL (ref 150–400)
RBC: 3.62 MIL/uL — ABNORMAL LOW (ref 3.87–5.11)
RDW: 14.9 % (ref 11.5–15.5)
WBC: 6.1 10*3/uL (ref 4.0–10.5)
nRBC: 0 % (ref 0.0–0.2)

## 2022-07-03 MED ORDER — HEPARIN SOD (PORK) LOCK FLUSH 100 UNIT/ML IV SOLN
500.0000 [IU] | Freq: Once | INTRAVENOUS | Status: AC | PRN
  Administered 2022-07-03: 500 [IU]

## 2022-07-03 MED ORDER — SODIUM CHLORIDE 0.9% FLUSH
10.0000 mL | INTRAVENOUS | Status: DC | PRN
  Administered 2022-07-03: 10 mL

## 2022-07-03 MED ORDER — TRASTUZUMAB-DKST CHEMO 150 MG IV SOLR
6.0000 mg/kg | Freq: Once | INTRAVENOUS | Status: AC
  Administered 2022-07-03: 546 mg via INTRAVENOUS
  Filled 2022-07-03: qty 26

## 2022-07-03 MED ORDER — SODIUM CHLORIDE 0.9 % IV SOLN
420.0000 mg | Freq: Once | INTRAVENOUS | Status: AC
  Administered 2022-07-03: 420 mg via INTRAVENOUS
  Filled 2022-07-03: qty 14

## 2022-07-03 MED ORDER — ACETAMINOPHEN 325 MG PO TABS
650.0000 mg | ORAL_TABLET | Freq: Once | ORAL | Status: AC
  Administered 2022-07-03: 650 mg via ORAL
  Filled 2022-07-03: qty 2

## 2022-07-03 MED ORDER — SODIUM CHLORIDE 0.9% FLUSH
10.0000 mL | Freq: Once | INTRAVENOUS | Status: AC
  Administered 2022-07-03: 10 mL

## 2022-07-03 MED ORDER — SODIUM CHLORIDE 0.9 % IV SOLN: Freq: Once | INTRAVENOUS | Status: AC

## 2022-07-03 NOTE — Telephone Encounter (Signed)
Scheduled appointment epr provider. Patient aware.

## 2022-07-03 NOTE — Assessment & Plan Note (Addendum)
#  Right breast invasive ductal carcinoma --Biopsy proved on 12/25/2021. Pathology showed invasive ductal carcinoma grade 3, ductal carcinoma in situ and calcifications, 5 cm from the nipple.  Prognostics from this mass showed ER 70% moderate, PR 20% moderate, HER2 positive by IHC, Ki-67 of 30%. --Recommendation is neoadjuvant TCHP q 21 days given T2 tumor with HER2 amplification. -- She completed neoadjuvant chemo, tolerated it very well -- Clinically and radiologically complete response --- She had lumpectomy and sentinel lymph nodes, complete pathologic response.  At this time we will recommend continuing adjuvant Herceptin and pertuzumab for a total duration of 1 year and consider adjuvant antiestrogen therapy after completion of adjuvant radiation --EF showed a slight drop, following with cardiac oncology, Dr Haroldine Laws, ok to continue herceptin and perjeta

## 2022-07-03 NOTE — Patient Instructions (Signed)
[facility]  Discharge Instructions: Thank you for choosing [facility] to provide your oncology and hematology care.   If you have a lab appointment with the [facility], please go directly to the [facility] and check in at the registration area.   Wear comfortable clothing and clothing appropriate for easy access to any Portacath or PICC line.   We strive to give you quality time with your provider. You may need to reschedule your appointment if you arrive late (15 or more minutes).  Arriving late affects you and other patients whose appointments are after yours.  Also, if you miss three or more appointments without notifying the office, you may be dismissed from the clinic at the provider's discretion.      For prescription refill requests, have your pharmacy contact our office and allow 72 hours for refills to be completed.    Today you received the following chemotherapy and/or immunotherapy agents: Trastuzumab, Pertuzumab.      To help prevent nausea and vomiting after your treatment, we encourage you to take your nausea medication as directed.  BELOW ARE SYMPTOMS THAT SHOULD BE REPORTED IMMEDIATELY: *FEVER GREATER THAN 100.4 F (38 C) OR HIGHER *CHILLS OR SWEATING *NAUSEA AND VOMITING THAT IS NOT CONTROLLED WITH YOUR NAUSEA MEDICATION *UNUSUAL SHORTNESS OF BREATH *UNUSUAL BRUISING OR BLEEDING *URINARY PROBLEMS (pain or burning when urinating, or frequent urination) *BOWEL PROBLEMS (unusual diarrhea, constipation, pain near the anus) TENDERNESS IN MOUTH AND THROAT WITH OR WITHOUT PRESENCE OF ULCERS (sore throat, sores in mouth, or a toothache) UNUSUAL RASH, SWELLING OR PAIN  UNUSUAL VAGINAL DISCHARGE OR ITCHING   Items with * indicate a potential emergency and should be followed up as soon as possible or go to the Emergency Department if any problems should occur.  Please show the CHEMOTHERAPY ALERT CARD or IMMUNOTHERAPY ALERT CARD  at check-in to the Emergency Department and triage nurse.  Should you have questions after your visit or need to cancel or reschedule your appointment, please contact [facility]  Dept: [phone]  and follow the prompts.  Office hours are 8:00 a.m. to 4:30 p.m. Monday - Friday. Please note that voicemails left after 4:00 p.m. may not be returned until the following business day.  We are closed weekends and major holidays. You have access to a nurse at all times for urgent questions. Please call the main number to the clinic Dept: [phone] and follow the prompts.   For any non-urgent questions, you may also contact your provider using MyChart. We now offer e-Visits for anyone 42 and older to request care online for non-urgent symptoms. For details visit [email or web address].   Also download the MyChart app! Go to the app store, search "MyChart", open the app, select , and log in with your MyChart username and password.  Masks are optional in the cancer centers. If you would like for your care team to wear a mask while they are taking care of you, please let them know. For doctor visits, patients may have with them one support person who is at least 48 years old. At this time, visitors are not allowed in the infusion area.

## 2022-07-03 NOTE — Progress Notes (Signed)
[facility]  PROGRESS NOTE  Patient Care Team: Pcp, No as PCP - General Mauro Kaufmann, RN as Oncology Nurse Navigator Rockwell Germany, RN as Oncology Nurse Navigator Rolm Bookbinder, MD as Consulting Physician (General Surgery) Benay Pike, MD as Consulting Physician (Hematology and Oncology) Eppie Gibson, MD as Attending Physician (Radiation Oncology)  CHIEF COMPLAINTS:  Right breast invasive ductal carcinoma.  Oncology History  Malignant neoplasm of upper-outer quadrant of right breast in female, estrogen receptor positive ([place])  12/31/2021 Initial Diagnosis   Malignant neoplasm of upper-outer quadrant of right breast in female, estrogen receptor positive ([place])   01/02/2022 Cancer Staging   Staging form: Breast, AJCC 8th Edition - Clinical stage from 01/02/2022: Stage IB (cT2, cN0, cM0, G3, ER+, PR+, HER2+) - Signed by Benay Pike, MD on 01/02/2022 Histologic grading system: 3 grade system   01/14/2022 Genetic Testing   Negative hereditary cancer genetic testing: no pathogenic variants detected in [facility] CustomNext-Cancer +RNAinsight Panel.  The report date is January 14, 2022.  The CustomNext-Cancer+RNAinsight panel offered by [facility] includes sequencing and rearrangement analysis for the following 47 genes:  APC, ATM, AXIN2, BARD1, BMPR1A, BRCA1, BRCA2, BRIP1, CDH1, CDK4, CDKN2A, CHEK2, DICER1, EPCAM, GREM1, HOXB13, MEN1, MLH1, MSH2, MSH3, MSH6, MUTYH, NBN, NF1, NF2, NTHL1, PALB2, PMS2, POLD1, POLE, PTEN, RAD51C, RAD51D, RECQL, RET, SDHA, SDHAF2, SDHB, SDHC, SDHD, SMAD4, SMARCA4, STK11, TP53, TSC1, TSC2, and VHL.  RNA data is routinely analyzed for use in variant interpretation for all genes.   01/16/2022 -  Chemotherapy   Completed chemotherapy May 2023, received 6 cycles of neoadjuvant TCHP Patient is on Treatment Plan : BREAST  Docetaxel + Carboplatin + Trastuzumab + Pertuzumab  (TCHP) q21d      06/19/2022 Definitive Surgery   Right breast  lumpectomy showed fibrosis with mild inflammation, negative for residual carcinoma, all surgical margins negative for carcinoma, right axillary sentinel lymph node negative for metastatic carcinoma, final pathologic staging PT0PN0    Interval history   NEEMA BARREIRA 46 y.o. female is here after definitive surgery. She feels well mostly, notices mild tingling and numbness in hands, very mild sensation in right foot. Hair is starting to grow back Energy is back, going to the gym, doing more walking. She is eating well. No chest pain, chest pressure, SOB. Rest of the pertinent 10 point ROS reviewed and negative.  MEDICAL HISTORY:  Past Medical History:  Diagnosis Date   Anemia    IDA   Cancer ([place])    right breast cancer   Family history of prostate cancer 01/02/2022    SURGICAL HISTORY: Past Surgical History:  Procedure Laterality Date   BREAST BIOPSY Right 12/25/2021   BREAST LUMPECTOMY WITH RADIOACTIVE SEED AND SENTINEL LYMPH NODE BIOPSY Right 06/19/2022   Procedure: RADIOACTIVE SEED GUIDED RIGHT BREAST LUMPECTOMY, RIGHT AXILLARY SENTINEL LYMPH NODE BIOPSY;  Surgeon: Rolm Bookbinder, MD;  Location: [facility];  Service: General;  Laterality: Right;   PORTACATH PLACEMENT N/A 01/14/2022   Procedure: INSERTION PORT-A-CATH;  Surgeon: Rolm Bookbinder, MD;  Location: [facility];  Service: General;  Laterality: N/A;   THERAPEUTIC ABORTION     WISDOM TOOTH EXTRACTION      SOCIAL HISTORY: Social History   Socioeconomic History   Marital status: Single    Spouse name: Not on file   Number of children: Not on file   Years of education: Not on file   Highest education level: Not on file  Occupational History   Not on file  Tobacco Use   Smoking status: Never   Smokeless tobacco: Never  Vaping Use   Vaping Use: Never used  Substance and Sexual Activity   Alcohol use: Yes    Comment: occasional wine   Drug use: Never   Sexual activity: Yes    Birth  control/protection: I.U.D.    Comment: Mirena IUD  Other Topics Concern   Not on file  Social History Narrative   Not on file   Social Determinants of Health   Financial Resource Strain: Low Risk  (01/02/2022)   Overall Financial Resource Strain (CARDIA)    Difficulty of Paying Living Expenses: Not hard at all  Food Insecurity: No Food Insecurity (01/02/2022)   Hunger Vital Sign    Worried About Running Out of Food in the Last Year: Never true    Ran Out of Food in the Last Year: Never true  Transportation Needs: No Transportation Needs (01/02/2022)   PRAPARE - Hydrologist (Medical): No    Lack of Transportation (Non-Medical): No  Physical Activity: Not on file  Stress: Not on file  Social Connections: Not on file  Intimate Partner Violence: Not on file    FAMILY HISTORY: Family History  Problem Relation Age of Onset   Prostate cancer Father 81    ALLERGIES:  is allergic to codeine and kiwi extract.  MEDICATIONS:  Current Outpatient Medications  Medication Sig Dispense Refill   Ascorbic Acid (VITAMIN C PO) Take 1 tablet by mouth in the morning.     BIOTIN PO Take 1 tablet by mouth in the morning.     Cholecalciferol (VITAMIN D3 PO) Take 1 tablet by mouth in the morning.     COD LIVER OIL PO Take by mouth.     dexamethasone (DECADRON) 4 MG tablet Take 2 tablets (8 mg total) by mouth 2 (two) times daily. Start the day before Taxotere. Then take daily x 3 days after chemotherapy. 30 tablet 1   ibuprofen (ADVIL) 200 MG tablet Take 200 mg by mouth every 8 (eight) hours as needed (pain.).     levonorgestrel (MIRENA, 52 MG,) 20 MCG/DAY IUD Mirena 20 mcg/24 hours (8 yrs) 52 mg intrauterine device     lidocaine-prilocaine (EMLA) cream Apply to affected area once 30 g 3   LORazepam (ATIVAN) 0.5 MG tablet Take 1 tablet (0.5 mg total) by mouth every 6 (six) hours as needed (Nausea or vomiting). 30 tablet 0   losartan (COZAAR) 25 MG tablet Take 1 tablet (25  mg total) by mouth daily. 30 tablet 3   metoprolol succinate (TOPROL XL) 25 MG 24 hr tablet Take 1 tablet (25 mg total) by mouth daily. 30 tablet 3   Multiple Minerals-Vitamins (CAL MAG ZINC +D3 PO) Take 1 tablet by mouth in the morning.     ondansetron (ZOFRAN) 8 MG tablet Take 1 tablet (8 mg total) by mouth 2 (two) times daily as needed (Nausea or vomiting). Start on the third day after chemotherapy. 30 tablet 1   OVER THE COUNTER MEDICATION Take 1 capsule by mouth in the morning. Sea Moss Superfood Capsule     oxyCODONE (OXY IR/ROXICODONE) 5 MG immediate release tablet Take 1 tablet (5 mg total) by mouth every 6 (six) hours as needed. 10 tablet 0   prochlorperazine (COMPAZINE) 10 MG tablet Take 1 tablet (10 mg total) by mouth every 6 (six) hours as needed (Nausea or vomiting). 30 tablet 1   traMADol (ULTRAM) 50 MG tablet Take 2  tablets (100 mg total) by mouth every 6 (six) hours as needed. 10 tablet 0   Turmeric (QC TUMERIC COMPLEX PO) Take by mouth.     No current facility-administered medications for this visit.    PHYSICAL EXAMINATION: ECOG PERFORMANCE STATUS: 1 - Symptomatic but completely ambulatory  Vitals:   07/03/22 1347  BP: 104/63  Pulse: 76  Resp: 16  Temp: 99.1 F (37.3 C)  SpO2: 99%        Filed Weights   07/03/22 1347  Weight: 185 lb 6.4 oz (84.1 kg)      Physical Exam Constitutional:      Appearance: Normal appearance.  Cardiovascular:     Rate and Rhythm: Normal rate and regular rhythm.  Pulmonary:     Effort: Pulmonary effort is normal.     Breath sounds: Normal breath sounds.  Chest:     Comments: Right breast appears to be healing well, Steri-Strips in place Musculoskeletal:        General: No swelling.  Lymphadenopathy:     Cervical: No cervical adenopathy.  Skin:    General: Skin is warm and dry.  Neurological:     General: No focal deficit present.     Mental Status: She is alert.     LABORATORY DATA:  I have reviewed the data as  listed Lab Results  Component Value Date   WBC 6.1 07/03/2022   HGB 10.1 (L) 07/03/2022   HCT 31.2 (L) 07/03/2022   MCV 86.2 07/03/2022   PLT 304 07/03/2022   Lab Results  Component Value Date   NA 141 07/03/2022   K 3.8 07/03/2022   CL 107 07/03/2022   CO2 29 07/03/2022    RADIOGRAPHIC STUDIES: I have personally reviewed the radiological reports and agreed with the findings in the report.  ASSESSMENT AND PLAN:  BIANNCA SCANTLIN is a 46 y.o. female who presents for a follow up for right breast invasive ductal carcinoma.   Malignant neoplasm of upper-outer quadrant of right breast in female, estrogen receptor positive ([place])  #Right breast invasive ductal carcinoma --Biopsy proved on 12/25/2021. Pathology showed invasive ductal carcinoma grade 3, ductal carcinoma in situ and calcifications, 5 cm from the nipple.  Prognostics from this mass showed ER 70% moderate, PR 20% moderate, HER2 positive by IHC, Ki-67 of 30%. --Recommendation is neoadjuvant TCHP q 21 days given T2 tumor with HER2 amplification. -- She completed neoadjuvant chemo, tolerated it very well -- Clinically and radiologically complete response --- She had lumpectomy and sentinel lymph nodes, complete pathologic response.  At this time we will recommend continuing adjuvant Herceptin and pertuzumab for a total duration of 1 year and consider adjuvant antiestrogen therapy after completion of adjuvant radiation --EF showed a slight drop, following with cardiac oncology, Dr Haroldine Laws, ok to continue herceptin and perjeta  #Indeterminate 0.6 cm tubular enhancing mass in the outer left breast: --Seen on MRI breast on 01/09/2022 --Underwent MRI guided biopsy on 01/17/2022.  Pathology reports showed intraductal papilloma and fibrocystic changes. No evidence of malignancy.   I have spent a total of 30 minutes minutes of face-to-face and non-face-to-face time, preparing to see the patient, performing a medically appropriate  examination, counseling and educating the patient, ordering tests/procedures,documenting clinical information in the electronic health record, and care coordination.   She is not eligible for her 2Compass study at this time given complete pathologic response.  Return to clinic every 3 weeks for Herceptin and pertuzumab.  Return to clinic with me in 6  weeks

## 2022-07-03 NOTE — Telephone Encounter (Signed)
Per MD ok to treat with noted EF% of 53.  Pt was seen by cardiologist as well with recommendation to continue therapy.

## 2022-07-04 ENCOUNTER — Encounter: Payer: Self-pay | Admitting: *Deleted

## 2022-07-09 DIAGNOSIS — C50411 Malignant neoplasm of upper-outer quadrant of right female breast: Secondary | ICD-10-CM | POA: Diagnosis not present

## 2022-07-09 DIAGNOSIS — Z170 Estrogen receptor positive status [ER+]: Secondary | ICD-10-CM | POA: Diagnosis not present

## 2022-07-09 NOTE — Progress Notes (Signed)
Radiation Oncology         [phone]) [phone] ________________________________  Name: Tonya Blair MRN: [id number]  Date: 07/10/2022  DOB: 12/04/1976  Follow-Up Visit Note  Outpatient  CC: Pcp, No  Iruku, Praveena, MD  Diagnosis:   No diagnosis found.   S/p chemotherapy and lumpectomy: No residual carcinoma    Cancer Staging  Malignant neoplasm of upper-outer quadrant of right breast in female, estrogen receptor positive ([place]) Staging form: Breast, AJCC 8th Edition - Clinical stage from 01/02/2022: Stage IB (cT2, cN0, cM0, G3, ER+, PR+, HER2+) - Signed by Benay Pike, MD on 01/02/2022  CHIEF COMPLAINT: Here to discuss management of right breast cancer  Narrative:  The patient returns today for follow-up.     Since breast clinic consultation date of 01/02/22, she underwent genetic testing on 01/14/22. Results showed no clinically significant variants detected by BRCAplus or +RNAinsight testing.   Bilateral breast MRI on 01/09/22 showed the biopsy proven malignancy in the outer right breast with associated linear/nodular enhancement extending anteriorly, collectively measuring up to 5.1 cm. An indeterminate 0.6 cm tubular enhancing mass in the outer left breast was also appreciated.   Breast or nodal biopsies, since consultation, involved (dates and results as follows): The patient underwent biopsy of the indeterminate 6 mm enhancing mass over the left outer lower quadrant on 01/17/22. Pathology revealed no evidence of malignancy, with an intraductal papilloma and fibrocystic changes.   Systemic therapy, if applicable, involved (dates and therapy as follows): the patient has completed chemotherapy consisting of TCHP from 01/16/22 through 05/01/22 under the care of Dr. Chryl Heck. Overall, the patient tolerated systemic treatment very well with minimal side effects.   Left breast ultrasound on 02/04/22 showed findings consistent with benign fat necrosis following biopsy. No sonographic  evidence for malignancy identified.   Bilateral breast MRI on 05/02/22 revealed a complete response to interval treatment at the site of the patient's biopsy-proven carcinoma in the lower outer quadrant of the right breast. No persistent mass or non-mass enhancement was identified, or persistent mass or non-mass enhancement at the site of patient's biopsy-proven papilloma within the lower outer quadrant of the left breast.  The patient opted to proceed with right breast lumpectomy with nodal biopsies on 06/19/22 under the care of Dr. Donne Hazel.  Pathology from the procedure revealed: no evidence of residual carcinoma with fibrosis and mild inflammation; all margins negative for carcinoma; nodal status of 4/4 right axillary SLN excisions negative for metastatic carcinoma.   Per her most recent follow up visit with Dr. Chryl Heck on 07/03/22, the patient agreed to proceed with adjuvant Herceptin and pertuzumab for a total duration of 1 year followed by adjuvant antiestrogen therapy after completion of XRT.   Symptomatically, the patient reports: ***        ALLERGIES:  is allergic to codeine and kiwi extract.  Meds: Current Outpatient Medications  Medication Sig Dispense Refill   Ascorbic Acid (VITAMIN C PO) Take 1 tablet by mouth in the morning.     BIOTIN PO Take 1 tablet by mouth in the morning.     Cholecalciferol (VITAMIN D3 PO) Take 1 tablet by mouth in the morning.     COD LIVER OIL PO Take by mouth.     dexamethasone (DECADRON) 4 MG tablet Take 2 tablets (8 mg total) by mouth 2 (two) times daily. Start the day before Taxotere. Then take daily x 3 days after chemotherapy. 30 tablet 1   ibuprofen (ADVIL) 200 MG tablet Take 200 mg by  mouth every 8 (eight) hours as needed (pain.).     levonorgestrel (MIRENA, 52 MG,) 20 MCG/DAY IUD Mirena 20 mcg/24 hours (8 yrs) 52 mg intrauterine device     lidocaine-prilocaine (EMLA) cream Apply to affected area once 30 g 3   LORazepam (ATIVAN) 0.5 MG tablet  Take 1 tablet (0.5 mg total) by mouth every 6 (six) hours as needed (Nausea or vomiting). 30 tablet 0   losartan (COZAAR) 25 MG tablet Take 1 tablet (25 mg total) by mouth daily. 30 tablet 3   metoprolol succinate (TOPROL XL) 25 MG 24 hr tablet Take 1 tablet (25 mg total) by mouth daily. 30 tablet 3   Multiple Minerals-Vitamins (CAL MAG ZINC +D3 PO) Take 1 tablet by mouth in the morning.     ondansetron (ZOFRAN) 8 MG tablet Take 1 tablet (8 mg total) by mouth 2 (two) times daily as needed (Nausea or vomiting). Start on the third day after chemotherapy. 30 tablet 1   OVER THE COUNTER MEDICATION Take 1 capsule by mouth in the morning. Sea Moss Superfood Capsule     oxyCODONE (OXY IR/ROXICODONE) 5 MG immediate release tablet Take 1 tablet (5 mg total) by mouth every 6 (six) hours as needed. 10 tablet 0   prochlorperazine (COMPAZINE) 10 MG tablet Take 1 tablet (10 mg total) by mouth every 6 (six) hours as needed (Nausea or vomiting). 30 tablet 1   traMADol (ULTRAM) 50 MG tablet Take 2 tablets (100 mg total) by mouth every 6 (six) hours as needed. 10 tablet 0   Turmeric (QC TUMERIC COMPLEX PO) Take by mouth.     No current facility-administered medications for this encounter.    Physical Findings:  vitals were not taken for this visit. .     General: Alert and oriented, in no acute distress HEENT: Head is normocephalic. Extraocular movements are intact. Oropharynx is clear. Neck: Neck is supple, no palpable cervical or supraclavicular lymphadenopathy. Heart: Regular in rate and rhythm with no murmurs, rubs, or gallops. Chest: Clear to auscultation bilaterally, with no rhonchi, wheezes, or rales. Abdomen: Soft, nontender, nondistended, with no rigidity or guarding. Extremities: No cyanosis or edema. Lymphatics: see Neck Exam Musculoskeletal: symmetric strength and muscle tone throughout. Neurologic: No obvious focalities. Speech is fluent.  Psychiatric: Judgment and insight are intact. Affect is  appropriate. Breast exam reveals ***  Lab Findings: Lab Results  Component Value Date   WBC 6.1 07/03/2022   HGB 10.1 (L) 07/03/2022   HCT 31.2 (L) 07/03/2022   MCV 86.2 07/03/2022   PLT 304 07/03/2022    @LASTCHEMISTRY @  Radiographic Findings: ECHOCARDIOGRAM COMPLETE  Result Date: 06/26/2022    ECHOCARDIOGRAM REPORT   Patient Name:   Tonya Blair Date of Exam: 06/26/2022 Medical Rec #:  [id number]        Height:       62.0 in Accession #:    [id number]       Weight:       185.4 lb Date of Birth:  March 06, 1976       BSA:          1.851 m Patient Age:    41 years         BP:           126/77 mmHg Patient Gender: F                HR:           82 bpm. Exam Location:  Outpatient Procedure: 2D  Echo, 3D Echo, Cardiac Doppler, Color Doppler and Strain Analysis Indications:    Chemo Z09  History:        Patient has prior history of Echocardiogram examinations, most                 recent 03/29/2022.  Sonographer:    Darlina Sicilian RDCS Referring Phys: [id number] [place]  1. Left ventricular ejection fraction by 3D volume is 53 %. The left ventricle has low normal function. The left ventricle has no regional wall motion abnormalities. Left ventricular diastolic parameters are consistent with Grade I diastolic dysfunction  (impaired relaxation).  2. Right ventricular systolic function is low normal. The right ventricular size is normal. There is normal pulmonary artery systolic pressure. The estimated right ventricular systolic pressure is 7.2 mmHg.  3. The mitral valve is normal in structure. Trivial mitral valve regurgitation.  4. The aortic valve is tricuspid. Aortic valve regurgitation is not visualized. No aortic stenosis is present.  5. The inferior vena cava is normal in size with greater than 50% respiratory variability, suggesting right atrial pressure of 3 mmHg. Comparison(s): Compared to prior TTE on 03/2022, the LVEF appears slightly less vigorous on current study (prior EF 60-65%).  GLS remains in normal range at -18.8% (previous study without reported GLS). FINDINGS  Left Ventricle: Left ventricular ejection fraction by 3D volume is 53 %. The left ventricle has low normal function. The left ventricle has no regional wall motion abnormalities. The global longitudinal strain is normal despite suboptimal segment tracking. The left ventricular internal cavity size was normal in size. There is no left ventricular hypertrophy. Left ventricular diastolic parameters are consistent with Grade I diastolic dysfunction (impaired relaxation). Right Ventricle: The right ventricular size is normal. No increase in right ventricular wall thickness. Right ventricular systolic function is low normal. There is normal pulmonary artery systolic pressure. The tricuspid regurgitant velocity is 1.02 m/s,  and with an assumed right atrial pressure of 3 mmHg, the estimated right ventricular systolic pressure is 7.2 mmHg. Left Atrium: Left atrial size was normal in size. Right Atrium: Right atrial size was normal in size. Pericardium: There is no evidence of pericardial effusion. Mitral Valve: The mitral valve is normal in structure. Trivial mitral valve regurgitation. Tricuspid Valve: The tricuspid valve is normal in structure. Tricuspid valve regurgitation is trivial. Aortic Valve: The aortic valve is tricuspid. Aortic valve regurgitation is not visualized. No aortic stenosis is present. Pulmonic Valve: The pulmonic valve was normal in structure. Pulmonic valve regurgitation is trivial. Aorta: The aortic root and ascending aorta are structurally normal, with no evidence of dilitation. Venous: The inferior vena cava is normal in size with greater than 50% respiratory variability, suggesting right atrial pressure of 3 mmHg. IAS/Shunts: The atrial septum is grossly normal.  LEFT VENTRICLE PLAX 2D LVIDd:         4.60 cm         Diastology LVIDs:         3.00 cm         LV e' medial:    6.85 cm/s LV PW:         0.80 cm          LV E/e' medial:  7.6 LV IVS:        0.70 cm         LV e' lateral:   12.00 cm/s LVOT diam:     1.70 cm         LV E/e'  lateral: 4.3 LV SV:         44 LV SV Index:   24              2D LVOT Area:     2.27 cm        Longitudinal                                Strain                                2D Strain GLS  -18.8 %                                Avg:                                 3D Volume EF                                LV 3D EF:    Left                                             ventricul                                             ar                                             ejection                                             fraction                                             by 3D                                             volume is                                             53 %.                                 3D Volume EF:  3D EF:        53 % RIGHT VENTRICLE RV S prime:     9.36 cm/s TAPSE (M-mode): 1.3 cm LEFT ATRIUM             Index        RIGHT ATRIUM           Index LA diam:        3.30 cm 1.78 cm/m   RA Area:     14.10 cm LA Vol (A2C):   30.7 ml 16.58 ml/m  RA Volume:   36.50 ml  19.72 ml/m LA Vol (A4C):   29.7 ml 16.04 ml/m LA Biplane Vol: 30.5 ml 16.48 ml/m  AORTIC VALVE LVOT Vmax:   108.00 cm/s LVOT Vmean:  72.500 cm/s LVOT VTI:    0.196 m  AORTA Ao Root diam: 2.60 cm Ao Asc diam:  2.70 cm MITRAL VALVE               TRICUSPID VALVE MV Area (PHT): 2.16 cm    TR Peak grad:   4.2 mmHg MV Decel Time: 351 msec    TR Vmax:        102.00 cm/s MV E velocity: 51.80 cm/s MV A velocity: 57.90 cm/s  SHUNTS MV E/A ratio:  0.89        Systemic VTI:  0.20 m                            Systemic Diam: 1.70 cm Gwyndolyn Kaufman MD Electronically signed by Gwyndolyn Kaufman MD Signature Date/Time: 06/26/2022/9:49:29 AM    Final    MM Breast Surgical Specimen  Result Date: 06/19/2022 CLINICAL DATA:  Assess surgical specimen following radioactive seed localization  of a right breast lesion. EXAM: SPECIMEN RADIOGRAPH OF THE RIGHT BREAST COMPARISON:  None Available. FINDINGS: Status post excision of the right breast. The radioactive seed and biopsy marker clip are present, completely intact, and were marked for pathology. IMPRESSION: Specimen radiograph of the right breast. Electronically Signed   By: Lajean Manes M.D.   On: 06/19/2022 09:44  MM RT RADIOACTIVE SEED LOC MAMMO GUIDE  Result Date: 06/18/2022 CLINICAL DATA:  Patient presents for radioactive seed localization of a right breast carcinoma prior to surgical excision. EXAM: MAMMOGRAPHIC GUIDED RADIOACTIVE SEED LOCALIZATION OF THE RIGHT BREAST COMPARISON:  None Available. FINDINGS: Patient presents for radioactive seed localization prior to surgical excision. I met with the patient and we discussed the procedure of seed localization including benefits and alternatives. We discussed the high likelihood of a successful procedure. We discussed the risks of the procedure including infection, bleeding, tissue injury and further surgery. We discussed the low dose of radioactivity involved in the procedure. Informed, written consent was given. The usual time-out protocol was performed immediately prior to the procedure. Using mammographic guidance, sterile technique, 1% lidocaine and an I-125 radioactive seed, the ribbon shaped biopsy clip and associated residual calcifications were localized using a lateral approach. The follow-up mammogram images confirm the seed in the expected location and were marked for Dr. Donne Hazel. Follow-up survey of the patient confirms presence of the radioactive seed. Order number of I-125 seed:  [id number]. Total activity:  8.756 millicuries reference Date: 05/21/2022 The patient tolerated the procedure well and was released from the [place] [facility]. She was given instructions regarding seed removal. IMPRESSION: Radioactive seed localization of the right breast. No apparent complications.  Electronically Signed   By: Dedra Skeens.D.  On: 06/18/2022 13:36   Impression/Plan: We discussed adjuvant radiotherapy today.  I recommend *** in order to ***.  I reviewed the logistics, benefits, risks, and potential side effects of this treatment in detail. Risks may include but not necessary be limited to acute and late injury tissue in the radiation fields such as skin irritation (change in color/pigmentation, itching, dryness, pain, peeling). She may experience fatigue. We also discussed possible risk of long term cosmetic changes or scar tissue. There is also a smaller risk for lung toxicity, ***cardiac toxicity, ***brachial plexopathy, ***lymphedema, ***musculoskeletal changes, ***rib fragility or ***induction of a second malignancy, ***late chronic non-healing soft tissue wound.    The patient asked good questions which I answered to her satisfaction. She is enthusiastic about proceeding with treatment. A consent form has been *** signed and placed in her chart.  A total of *** medically necessary complex treatment devices will be fabricated and supervised by me: *** fields with MLCs for custom blocks to protect heart, and lungs;  and, a Vac-lok. MORE COMPLEX DEVICES MAY BE MADE IN DOSIMETRY FOR FIELD IN FIELD BEAMS FOR DOSE HOMOGENEITY.  I have requested : 3D Simulation which is medically necessary to give adequate dose to at risk tissues while sparing lungs and heart.  I have requested a DVH of the following structures: lungs, heart, *** lumpectomy cavity.    The patient will receive *** Gy in *** fractions to the *** with *** fields.  This will be *** followed by a boost.  On date of service, in total, I spent *** minutes on this encounter. Patient was seen in person.  _____________________________________   Eppie Gibson, MD  This document serves as a record of services personally performed by Eppie Gibson, MD. It was created on her behalf by Roney Mans, a trained medical scribe.  The creation of this record is based on the scribe's personal observations and the provider's statements to them. This document has been checked and approved by the attending provider.

## 2022-07-09 NOTE — Progress Notes (Signed)
Location of Breast Cancer:  Malignant neoplasm of upper-outer quadrant of right breast in female, estrogen receptor positive  Histology per Pathology Report:  (STATUS POST NEOADJUVANT TREATMENT) 06/19/2022 FINAL MICROSCOPIC DIAGNOSIS:  A. BREAST, RIGHT, LUMPECTOMY:  - Fibrosis with mild inflammation.  - Negative for residual carcinoma.  - All surgical margins negative for carcinoma.  - Biopsy site and biopsy clip.  - See oncology table.  B. LYMPH NODE, RIGHT AXILLARY, SENTINEL, EXCISION:  - Lymph node negative for metastatic carcinoma (0/1).  C. LYMPH NODE, RIGHT AXILLARY, SENTINEL, EXCISION:  - Lymph node negative for metastatic carcinoma (0/1).  D. LYMPH NODE, RIGHT AXILLARY, SENTINEL, EXCISION:  - Lymph node negative for metastatic carcinoma (0/1).  E. LYMPH NODE, RIGHT AXILLARY, SENTINEL, EXCISION:  - Lymph node negative for metastatic carcinoma (0/1).  F. BREAST, RIGHT ADDITIONAL MEDIAL MARGIN, EXCISION:  - Benign breast tissue.  - Medial margin negative for carcinoma.  G. BREAST, RIGHT ADDITIONAL SUPERIOR MARGIN, EXCISION:  - Benign breast tissue.  - Superior margin negative for carcinoma.  H. BREAST, RIGHT ADDITIONAL POSTERIOR MARGIN, EXCISION:  - Benign breast tissue.  - Posterior margin negative for carcinoma.  12/25/2021 Breast, right, needle core biopsy, right breast mass 9:00, 5 cmfn ribbon clip, right - INVASIVE DUCTAL CARCINOMA - DUCTAL CARCINOMA IN SITU - CALCIFICATIONS - SEE COMMENT Microscopic Comment Based on the biopsy, the carcinoma appears Nottingham grade 3 of 3 and measures 1.3 cm in greatest linear extent  Receptor Status: ER(70%), PR (20%), Her2-neu (Positive), Ki-67(30%)  Did patient present with symptoms (if so, please note symptoms) or was this found on screening mammography?: Bilateral screening mammogram on 09/14/21 showed right breast abnormality. Diagnostic right breast mammogram and right breast ultrasound on 12/11/21 showed a suspicious mass  in the 9 o'clock region of the right breast.     Past/Anticipated interventions by surgeon, if any:  06/19/2022 --Dr. Rolm Bookbinder Right breast radioactive seed guided lumpectomy Right deep axillary sentinel lymph node biopsy Injection of mag trace and blue dye for sentinel lymph node identification  Past/Anticipated interventions by medical oncology, if any:  Under care of Dr. Arletha Pili Iruku 07/03/2022 Recommendation is neoadjuvant TCHP q 21 days given T2 tumor with HER2 amplification. She completed neoadjuvant chemo (final cycle 05/01/22), tolerated it very well Clinically and radiologically complete response She had lumpectomy and sentinel lymph nodes, complete pathologic response.   At this time we will recommend continuing adjuvant Herceptin and pertuzumab for a total duration of 1 year  consider adjuvant antiestrogen therapy after completion of adjuvant radiation EF showed a slight drop, following with cardiac oncology, Dr Haroldine Laws, ok to continue herceptin and perjeta She is not eligible for her 2Compass study at this time given complete pathologic response. Return to clinic every 3 weeks for Herceptin and pertuzumab.   Return to clinic with me in 6 weeks  Lymphedema issues, if any:  Patient denies; scheduled for PT evaluation on 07/15/22    Pain issues, if any:  Reports discomfort is occasional and tolerable    SAFETY ISSUES: Prior radiation? No Pacemaker/ICD? No Possible current pregnancy?No--IUD; 06/19/22 urine pregnancy negative  Is the patient on methotrexate? No  Current Complaints / other details:  Nothing else of note

## 2022-07-10 ENCOUNTER — Ambulatory Visit
Admission: RE | Admit: 2022-07-10 | Discharge: 2022-07-10 | Disposition: A | Discharge status: Home / Self Care | Payer: 59 | Source: Ambulatory Visit | Attending: Radiation Oncology | Admitting: Radiation Oncology

## 2022-07-10 ENCOUNTER — Other Ambulatory Visit: Payer: Self-pay

## 2022-07-10 ENCOUNTER — Encounter: Payer: Self-pay | Admitting: Radiation Oncology

## 2022-07-10 VITALS — BP 113/78 | HR 72 | Temp 98.2°F | Resp 18 | Ht 62.0 in | Wt 186.1 lb

## 2022-07-10 DIAGNOSIS — Z793 Long term (current) use of hormonal contraceptives: Secondary | ICD-10-CM | POA: Insufficient documentation

## 2022-07-10 DIAGNOSIS — C50411 Malignant neoplasm of upper-outer quadrant of right female breast: Secondary | ICD-10-CM

## 2022-07-10 DIAGNOSIS — Z510 Encounter for antineoplastic radiation therapy: Secondary | ICD-10-CM | POA: Diagnosis not present

## 2022-07-10 DIAGNOSIS — Z170 Estrogen receptor positive status [ER+]: Secondary | ICD-10-CM | POA: Insufficient documentation

## 2022-07-10 DIAGNOSIS — Z79899 Other long term (current) drug therapy: Secondary | ICD-10-CM | POA: Insufficient documentation

## 2022-07-10 DIAGNOSIS — Z7952 Long term (current) use of systemic steroids: Secondary | ICD-10-CM | POA: Insufficient documentation

## 2022-07-10 LAB — PREGNANCY, URINE: Preg Test, Ur: NEGATIVE

## 2022-07-14 NOTE — Therapy (Signed)
OUTPATIENT PHYSICAL THERAPY BREAST CANCER POST OP FOLLOW UP   Patient Name: Tonya Blair MRN: [id number] DOB:10/06/1976, 46 y.o., female Today's Date: 07/15/2022   PT End of Session - 07/15/22 0912     Visit Number 2    Number of Visits 2    PT Start Time 0910    PT Stop Time 0936    PT Time Calculation (min) 26 min    Activity Tolerance Patient tolerated treatment well    Behavior During Therapy WFL for tasks assessed/performed             Past Medical History:  Diagnosis Date   Anemia    IDA   Cancer (HCC)    right breast cancer   Family history of prostate cancer 01/02/2022   Past Surgical History:  Procedure Laterality Date   BREAST BIOPSY Right 12/25/2021   BREAST LUMPECTOMY WITH RADIOACTIVE SEED AND SENTINEL LYMPH NODE BIOPSY Right 06/19/2022   Procedure: RADIOACTIVE SEED GUIDED RIGHT BREAST LUMPECTOMY, RIGHT AXILLARY SENTINEL LYMPH NODE BIOPSY;  Surgeon: Wakefield, Matthew, MD;  Location: [facility];  Service: General;  Laterality: Right;   PORTACATH PLACEMENT N/A 01/14/2022   Procedure: INSERTION PORT-A-CATH;  Surgeon: Wakefield, Matthew, MD;  Location: [facility] OR;  Service: General;  Laterality: N/A;   THERAPEUTIC ABORTION     WISDOM TOOTH EXTRACTION     Patient Active Problem List   Diagnosis Date Noted   Port-A-Cath in place 04/10/2022   Anemia due to antineoplastic chemotherapy 04/10/2022   Chemotherapy induced diarrhea 03/19/2022   Genetic testing 01/18/2022   Family history of prostate cancer 01/02/2022   Malignant neoplasm of upper-outer quadrant of right breast in female, estrogen receptor positive (HCC) 12/31/2021   Cervical intraepithelial neoplasia grade 2 10/28/2019    PCP: None  REFERRING PROVIDER: Dr. Wakefield  REFERRING DIAG: Rt breast cancer  THERAPY DIAG:  Malignant neoplasm of upper-outer quadrant of right breast in female, estrogen receptor positive (HCC)  Abnormal posture  Aftercare following surgery for  neoplasm  Rationale for Evaluation and Treatment Rehabilitation  ONSET DATE: 09/14/21  SUBJECTIVE:                                                                                                                                                                                           SUBJECTIVE STATEMENT: I am doing well now. I just saw Dr. Wakefield and he said it felt normal.  I have a compression bra.    PERTINENT HISTORY:  Completed neoadjuvant chemotherapy TCHP May 2023. Rt lumpectomy 06/19/22 showing no signs of carcinoma. 4 negative lymph node. Will be having radiation Thursday.     PATIENT GOALS:  Reassess how my recovery is going related to arm function, pain, and swelling.  PAIN:  Are you having pain? No  PRECAUTIONS: Recent Surgery, right UE Lymphedema risk,   ACTIVITY LEVEL / LEISURE: I feel back to normal.  I just started back at the gym last week.    OBJECTIVE:   PATIENT SURVEYS:  QUICK DASH: 2%  OBSERVATIONS:  Well healed incisions, wearing compression bra  POSTURE:  Rounded shoulders   LYMPHEDEMA ASSESSMENT:   A/PROM Right 01/02/2022 Rt 07/15/22 Left 01/02/2022 Lt 07/15/22  Shoulder flexion 164 168 163   Shoulder extension 43 55 40   Shoulder abduction 169 168 171   Shoulder internal rotation 54  56   Shoulder external rotation 90 90 86                           (Blank rows = not tested)   UPPER EXTREMITY STRENGTH: WNL    LYMPHEDEMA ASSESSMENTS:    LANDMARK RIGHT 01/02/2022 Rt 07/15/22 LEFT 01/02/2022 Lt 07/15/22  10 cm proximal to olecranon process 35.1 34 33.7 34  Olecranon process 27._0 cm proximal to ulnar styloid process 25 22.3 23.5 22.7  Just proximal to ulnar styloid process 17.1 17 16.8 17  Across hand at thumb web space 19.7 19.5 19.3 19  At base of 2nd digit 6.8 6.5 6.6 6.9  (Blank rows = not tested)   PATIENT EDUCATION:  Education details: per below Person educated: Patient Education method: Consulting civil engineer, Media planner,  and Handouts Education comprehension: verbalized understanding   HOME EXERCISE PROGRAM:  Reviewed previously given post op HEP with switch to wall walking flexion and abduction and DC of others  ASSESSMENT:  CLINICAL IMPRESSION: Pt has returned to full AROM, back to the gym and work.  Pt is ready for radiation.   Pt will benefit from skilled therapeutic intervention to improve on the following deficits: Decreased knowledge of precautions, impaired UE functional use, pain, decreased ROM, postural dysfunction.   PT treatment/interventions: ADL/Self care home management, Patient/Family education     GOALS: Goals reviewed with patient? Yes  LONG TERM GOALS:  (STG=LTG)  GOALS Name Target Date  Goal status  1 Pt will demonstrate she has regained full shoulder ROM and function post operatively compared to baselines.  Baseline: 07/15/22 MET  _1 PLAN: PT FREQUENCY/DURATION: SOZO only  PLAN FOR NEXT SESSION: SOZO every 3 months until at least 06/19/24   [facility]  [address], [address]  [place] [address]  [phone]  After Breast Cancer Class It is recommended you attend the ABC class to be educated on lymphedema risk reduction. This class is free of charge and lasts for 1 hour. It is a 1-time class. You will need to download the Webex app either on your phone or computer. We will send you a link the night before or the morning of the class. You should be able to click on that link to join the class. This is not a confidential class. You don't have to turn your camera on, but other participants may be able to see your email address.  Scar massage You can begin gentle scar massage to you incision sites. Gently place one hand on the incision and move the skin (without sliding on the skin) in various directions. Do this for a  few minutes and then you can gently massage either coconut oil or vitamin E cream into the  scars.  Compression garment You should continue wearing your compression bra until you feel like you no longer have swelling.  Home exercise Program Continue doing the exercises you were given until you feel like you can do them without feeling any tightness at the end.   Walking Program Studies show that 30 minutes of walking per day (fast enough to elevate your heart rate) can significantly reduce the risk of a cancer recurrence. If you can't walk due to other medical reasons, we encourage you to find another activity you could do (like a stationary bike or water exercise).  Posture After breast cancer surgery, people frequently sit with rounded shoulders posture because it puts their incisions on slack and feels better. If you sit like this and scar tissue forms in that position, you can become very tight and have pain sitting or standing with good posture. Try to be aware of your posture and sit and stand up tall to heal properly.  Follow up PT: It is recommended you return every 3 months for the first 3 years following surgery to be assessed on the SOZO machine for an L-Dex score. This helps prevent clinically significant lymphedema in 95% of patients. These follow up screens are 10 minute appointments that you are not billed for.  Stark Bray, PT 07/15/2022, 9:36 AM

## 2022-07-15 ENCOUNTER — Other Ambulatory Visit: Payer: Self-pay

## 2022-07-15 ENCOUNTER — Encounter: Payer: Self-pay | Admitting: Rehabilitation

## 2022-07-15 ENCOUNTER — Ambulatory Visit: Payer: 59 | Attending: General Surgery | Admitting: Rehabilitation

## 2022-07-15 DIAGNOSIS — C50411 Malignant neoplasm of upper-outer quadrant of right female breast: Secondary | ICD-10-CM | POA: Insufficient documentation

## 2022-07-15 DIAGNOSIS — Z483 Aftercare following surgery for neoplasm: Secondary | ICD-10-CM | POA: Insufficient documentation

## 2022-07-15 DIAGNOSIS — Z170 Estrogen receptor positive status [ER+]: Secondary | ICD-10-CM | POA: Insufficient documentation

## 2022-07-15 DIAGNOSIS — R293 Abnormal posture: Secondary | ICD-10-CM | POA: Insufficient documentation

## 2022-07-15 NOTE — Patient Instructions (Signed)
[facility]  [address], [address]  [place] [address]  [phone]  After Breast Cancer Class It is recommended you attend the ABC class to be educated on lymphedema risk reduction. This class is free of charge and lasts for 1 hour. It is a 1-time class. You will need to download the Webex app either on your phone or computer. We will send you a link the night before or the morning of the class. You should be able to click on that link to join the class. This is not a confidential class. You don't have to turn your camera on, but other participants may be able to see your email address.  Scar massage You can begin gentle scar massage to you incision sites. Gently place one hand on the incision and move the skin (without sliding on the skin) in various directions. Do this for a few minutes and then you can gently massage either coconut oil or vitamin E cream into the scars.  Compression garment You should continue wearing your compression bra until you feel like you no longer have swelling.  Home exercise Program Continue doing the exercises you were given until you feel like you can do them without feeling any tightness at the end.   Walking Program Studies show that 30 minutes of walking per day (fast enough to elevate your heart rate) can significantly reduce the risk of a cancer recurrence. If you can't walk due to other medical reasons, we encourage you to find another activity you could do (like a stationary bike or water exercise).  Posture After breast cancer surgery, people frequently sit with rounded shoulders posture because it puts their incisions on slack and feels better. If you sit like this and scar tissue forms in that position, you can become very tight and have pain sitting or standing with good posture. Try to be aware of your posture and sit and stand up tall to heal properly.  Follow up PT: It is recommended you return every 3 months for the  first 3 years following surgery to be assessed on the SOZO machine for an L-Dex score. This helps prevent clinically significant lymphedema in 95% of patients. These follow up screens are 10 minute appointments that you are not billed for. 

## 2022-07-16 ENCOUNTER — Encounter: Payer: Self-pay | Admitting: *Deleted

## 2022-07-17 ENCOUNTER — Encounter: Payer: Self-pay | Admitting: Hematology and Oncology

## 2022-07-17 DIAGNOSIS — Z170 Estrogen receptor positive status [ER+]: Secondary | ICD-10-CM | POA: Diagnosis not present

## 2022-07-17 DIAGNOSIS — Z510 Encounter for antineoplastic radiation therapy: Secondary | ICD-10-CM | POA: Diagnosis not present

## 2022-07-17 DIAGNOSIS — C50411 Malignant neoplasm of upper-outer quadrant of right female breast: Secondary | ICD-10-CM | POA: Diagnosis not present

## 2022-07-18 ENCOUNTER — Other Ambulatory Visit: Payer: Self-pay

## 2022-07-18 ENCOUNTER — Ambulatory Visit
Admission: RE | Admit: 2022-07-18 | Discharge: 2022-07-18 | Disposition: A | Discharge status: Home / Self Care | Payer: 59 | Source: Ambulatory Visit | Attending: Radiation Oncology | Admitting: Radiation Oncology

## 2022-07-18 DIAGNOSIS — Z170 Estrogen receptor positive status [ER+]: Secondary | ICD-10-CM | POA: Diagnosis not present

## 2022-07-18 DIAGNOSIS — C50411 Malignant neoplasm of upper-outer quadrant of right female breast: Secondary | ICD-10-CM | POA: Diagnosis not present

## 2022-07-18 DIAGNOSIS — Z510 Encounter for antineoplastic radiation therapy: Secondary | ICD-10-CM | POA: Diagnosis not present

## 2022-07-18 LAB — RAD ONC ARIA SESSION SUMMARY
Course Elapsed Days: 0
Plan Fractions Treated to Date: 1
Plan Prescribed Dose Per Fraction: 2.66 Gy
Plan Total Fractions Prescribed: 16
Plan Total Prescribed Dose: 42.56 Gy
Reference Point Dosage Given to Date: 2.66 Gy
Reference Point Session Dosage Given: 2.66 Gy
Session Number: 1

## 2022-07-19 ENCOUNTER — Other Ambulatory Visit: Payer: Self-pay

## 2022-07-19 ENCOUNTER — Ambulatory Visit
Admission: RE | Admit: 2022-07-19 | Discharge: 2022-07-19 | Disposition: A | Discharge status: Home / Self Care | Payer: 59 | Source: Ambulatory Visit | Attending: Radiation Oncology | Admitting: Radiation Oncology

## 2022-07-19 DIAGNOSIS — C50411 Malignant neoplasm of upper-outer quadrant of right female breast: Secondary | ICD-10-CM | POA: Diagnosis not present

## 2022-07-19 DIAGNOSIS — Z170 Estrogen receptor positive status [ER+]: Secondary | ICD-10-CM | POA: Diagnosis not present

## 2022-07-19 DIAGNOSIS — Z510 Encounter for antineoplastic radiation therapy: Secondary | ICD-10-CM | POA: Diagnosis not present

## 2022-07-19 LAB — RAD ONC ARIA SESSION SUMMARY
Course Elapsed Days: 1
Plan Fractions Treated to Date: 2
Plan Prescribed Dose Per Fraction: 2.66 Gy
Plan Total Fractions Prescribed: 16
Plan Total Prescribed Dose: 42.56 Gy
Reference Point Dosage Given to Date: 5.32 Gy
Reference Point Session Dosage Given: 2.66 Gy
Session Number: 2

## 2022-07-22 ENCOUNTER — Ambulatory Visit: Payer: 59

## 2022-07-23 ENCOUNTER — Other Ambulatory Visit: Payer: Self-pay

## 2022-07-23 ENCOUNTER — Ambulatory Visit
Admission: RE | Admit: 2022-07-23 | Discharge: 2022-07-23 | Disposition: A | Discharge status: Home / Self Care | Payer: 59 | Source: Ambulatory Visit | Attending: Radiation Oncology | Admitting: Radiation Oncology

## 2022-07-23 ENCOUNTER — Ambulatory Visit: Payer: 59

## 2022-07-23 DIAGNOSIS — Z170 Estrogen receptor positive status [ER+]: Secondary | ICD-10-CM | POA: Insufficient documentation

## 2022-07-23 DIAGNOSIS — C50411 Malignant neoplasm of upper-outer quadrant of right female breast: Secondary | ICD-10-CM

## 2022-07-23 DIAGNOSIS — Z510 Encounter for antineoplastic radiation therapy: Secondary | ICD-10-CM | POA: Diagnosis not present

## 2022-07-23 DIAGNOSIS — Z79899 Other long term (current) drug therapy: Secondary | ICD-10-CM | POA: Diagnosis not present

## 2022-07-23 DIAGNOSIS — Z5112 Encounter for antineoplastic immunotherapy: Secondary | ICD-10-CM | POA: Diagnosis not present

## 2022-07-23 LAB — RAD ONC ARIA SESSION SUMMARY
Course Elapsed Days: 5
Plan Fractions Treated to Date: 3
Plan Prescribed Dose Per Fraction: 2.66 Gy
Plan Total Fractions Prescribed: 16
Plan Total Prescribed Dose: 42.56 Gy
Reference Point Dosage Given to Date: 7.98 Gy
Reference Point Session Dosage Given: 2.66 Gy
Session Number: 3

## 2022-07-23 MED ORDER — ALRA NON-METALLIC DEODORANT (RAD-ONC)
1.0000 | Freq: Once | TOPICAL | Status: AC
  Administered 2022-07-23: 1 via TOPICAL

## 2022-07-23 MED ORDER — RADIAPLEXRX EX GEL: Freq: Once | CUTANEOUS | Status: AC

## 2022-07-23 NOTE — Progress Notes (Signed)
Pt here for patient teaching.    Pt given Radiation and You booklet, skin care instructions, Alra deodorant, and Radiaplex gel.    Reviewed areas of pertinence such as fatigue, hair loss in treatment field, skin changes, breast tenderness, and breast swelling .   Pt able to give teach back of to pat skin, use unscented/gentle soap, and drink plenty of water,apply Radiaplex bid, avoid applying anything to skin within 4 hours of treatment, avoid wearing an under wire bra, and to use an electric razor if they must shave.   Pt demonstrated understanding of information given and will contact nursing with any questions or concerns.    [email or web address]/treatmentinformation/whattoexpect/index

## 2022-07-24 ENCOUNTER — Ambulatory Visit
Admission: RE | Admit: 2022-07-24 | Discharge: 2022-07-24 | Disposition: A | Discharge status: Home / Self Care | Payer: 59 | Source: Ambulatory Visit | Attending: Radiation Oncology | Admitting: Radiation Oncology

## 2022-07-24 ENCOUNTER — Other Ambulatory Visit: Payer: Self-pay

## 2022-07-24 ENCOUNTER — Inpatient Hospital Stay: Payer: 59 | Attending: Hematology and Oncology

## 2022-07-24 ENCOUNTER — Inpatient Hospital Stay: Payer: 59

## 2022-07-24 VITALS — BP 97/36 | HR 65 | Temp 98.3°F | Resp 18 | Ht 62.0 in | Wt 184.5 lb

## 2022-07-24 DIAGNOSIS — Z510 Encounter for antineoplastic radiation therapy: Secondary | ICD-10-CM | POA: Diagnosis not present

## 2022-07-24 DIAGNOSIS — Z5112 Encounter for antineoplastic immunotherapy: Secondary | ICD-10-CM | POA: Insufficient documentation

## 2022-07-24 DIAGNOSIS — Z170 Estrogen receptor positive status [ER+]: Secondary | ICD-10-CM | POA: Diagnosis not present

## 2022-07-24 DIAGNOSIS — Z95828 Presence of other vascular implants and grafts: Secondary | ICD-10-CM

## 2022-07-24 DIAGNOSIS — Z79899 Other long term (current) drug therapy: Secondary | ICD-10-CM | POA: Diagnosis not present

## 2022-07-24 DIAGNOSIS — C50411 Malignant neoplasm of upper-outer quadrant of right female breast: Secondary | ICD-10-CM | POA: Diagnosis not present

## 2022-07-24 LAB — RAD ONC ARIA SESSION SUMMARY
Course Elapsed Days: 6
Plan Fractions Treated to Date: 4
Plan Prescribed Dose Per Fraction: 2.66 Gy
Plan Total Fractions Prescribed: 16
Plan Total Prescribed Dose: 42.56 Gy
Reference Point Dosage Given to Date: 10.64 Gy
Reference Point Session Dosage Given: 2.66 Gy
Session Number: 4

## 2022-07-24 LAB — CBC WITH DIFFERENTIAL/PLATELET
Abs Immature Granulocytes: 0.01 10*3/uL (ref 0.00–0.07)
Basophils Absolute: 0 10*3/uL (ref 0.0–0.1)
Basophils Relative: 0 %
Eosinophils Absolute: 0.3 10*3/uL (ref 0.0–0.5)
Eosinophils Relative: 7 %
HCT: 32.7 % — ABNORMAL LOW (ref 36.0–46.0)
Hemoglobin: 10.5 g/dL — ABNORMAL LOW (ref 12.0–15.0)
Immature Granulocytes: 0 %
Lymphocytes Relative: 43 %
Lymphs Abs: 2 10*3/uL (ref 0.7–4.0)
MCH: 26.9 pg (ref 26.0–34.0)
MCHC: 32.1 g/dL (ref 30.0–36.0)
MCV: 83.6 fL (ref 80.0–100.0)
Monocytes Absolute: 0.4 10*3/uL (ref 0.1–1.0)
Monocytes Relative: 8 %
Neutro Abs: 1.9 10*3/uL (ref 1.7–7.7)
Neutrophils Relative %: 42 %
Platelets: 244 10*3/uL (ref 150–400)
RBC: 3.91 MIL/uL (ref 3.87–5.11)
RDW: 14.6 % (ref 11.5–15.5)
WBC: 4.6 10*3/uL (ref 4.0–10.5)
nRBC: 0 % (ref 0.0–0.2)

## 2022-07-24 LAB — COMPREHENSIVE METABOLIC PANEL
ALT: 14 U/L (ref 0–44)
AST: 16 U/L (ref 15–41)
Albumin: 4.2 g/dL (ref 3.5–5.0)
Alkaline Phosphatase: 82 U/L (ref 38–126)
Anion gap: 6 (ref 5–15)
BUN: 18 mg/dL (ref 6–20)
CO2: 27 mmol/L (ref 22–32)
Calcium: 9.4 mg/dL (ref 8.9–10.3)
Chloride: 108 mmol/L (ref 98–111)
Creatinine, Ser: 0.97 mg/dL (ref 0.44–1.00)
GFR, Estimated: >60 mL/min (ref >60)
Glucose, Bld: 165 mg/dL — ABNORMAL HIGH (ref 70–99)
Potassium: 3.9 mmol/L (ref 3.5–5.1)
Sodium: 141 mmol/L (ref 135–145)
Total Bilirubin: 0.4 mg/dL (ref 0.3–1.2)
Total Protein: 6.9 g/dL (ref 6.5–8.1)

## 2022-07-24 MED ORDER — SODIUM CHLORIDE 0.9% FLUSH
10.0000 mL | Freq: Once | INTRAVENOUS | Status: AC
  Administered 2022-07-24: 10 mL

## 2022-07-24 MED ORDER — SODIUM CHLORIDE 0.9 % IV SOLN
420.0000 mg | Freq: Once | INTRAVENOUS | Status: AC
  Administered 2022-07-24: 420 mg via INTRAVENOUS
  Filled 2022-07-24: qty 14

## 2022-07-24 MED ORDER — TRASTUZUMAB-DKST CHEMO 150 MG IV SOLR
6.0000 mg/kg | Freq: Once | INTRAVENOUS | Status: AC
  Administered 2022-07-24: 546 mg via INTRAVENOUS
  Filled 2022-07-24: qty 26

## 2022-07-24 MED ORDER — HEPARIN SOD (PORK) LOCK FLUSH 100 UNIT/ML IV SOLN
500.0000 [IU] | Freq: Once | INTRAVENOUS | Status: AC | PRN
  Administered 2022-07-24: 500 [IU]

## 2022-07-24 MED ORDER — ACETAMINOPHEN 325 MG PO TABS
650.0000 mg | ORAL_TABLET | Freq: Once | ORAL | Status: AC
  Administered 2022-07-24: 650 mg via ORAL
  Filled 2022-07-24: qty 2

## 2022-07-24 MED ORDER — SODIUM CHLORIDE 0.9% FLUSH
10.0000 mL | INTRAVENOUS | Status: DC | PRN
  Administered 2022-07-24: 10 mL

## 2022-07-24 MED ORDER — SODIUM CHLORIDE 0.9 % IV SOLN: Freq: Once | INTRAVENOUS | Status: AC

## 2022-07-24 NOTE — Progress Notes (Signed)
Patient declined to stay for 30 minute post-observation period following perjeta infusion. Vital signs retaken. BP noted to be low. Dr. Chryl Heck notified. Per Dr. Chryl Heck, patient is okay to leave if she is asymptomatic. Patient denies any chest pain, shortness of breath, dizziness, or changes in vision at this time. Patient ambulatory and discharged in stable condition.   Patient advised to call cardiology office today and ED precautions reviewed.

## 2022-07-24 NOTE — Patient Instructions (Signed)
[facility] MEDICAL ONCOLOGY   Discharge Instructions: Thank you for choosing [facility] to provide your oncology and hematology care.   If you have a lab appointment with the Cancer Center, please go directly to the Cancer Center and check in at the registration area.   Wear comfortable clothing and clothing appropriate for easy access to any Portacath or PICC line.   We strive to give you quality time with your provider. You may need to reschedule your appointment if you arrive late (15 or more minutes).  Arriving late affects you and other patients whose appointments are after yours.  Also, if you miss three or more appointments without notifying the office, you may be dismissed from the clinic at the provider's discretion.      For prescription refill requests, have your pharmacy contact our office and allow 72 hours for refills to be completed.    Today you received the following chemotherapy and/or immunotherapy agents: Trastuzumab (Herceptin) and Pertuzumab (Perjeta)      To help prevent nausea and vomiting after your treatment, we encourage you to take your nausea medication as directed.  BELOW ARE SYMPTOMS THAT SHOULD BE REPORTED IMMEDIATELY: *FEVER GREATER THAN 100.4 F (38 C) OR HIGHER *CHILLS OR SWEATING *NAUSEA AND VOMITING THAT IS NOT CONTROLLED WITH YOUR NAUSEA MEDICATION *UNUSUAL SHORTNESS OF BREATH *UNUSUAL BRUISING OR BLEEDING *URINARY PROBLEMS (pain or burning when urinating, or frequent urination) *BOWEL PROBLEMS (unusual diarrhea, constipation, pain near the anus) TENDERNESS IN MOUTH AND THROAT WITH OR WITHOUT PRESENCE OF ULCERS (sore throat, sores in mouth, or a toothache) UNUSUAL RASH, SWELLING OR PAIN  UNUSUAL VAGINAL DISCHARGE OR ITCHING   Items with * indicate a potential emergency and should be followed up as soon as possible or go to the Emergency Department if any problems should occur.  Please show the CHEMOTHERAPY ALERT CARD or  IMMUNOTHERAPY ALERT CARD at check-in to the Emergency Department and triage nurse.  Should you have questions after your visit or need to cancel or reschedule your appointment, please contact [facility] MEDICAL ONCOLOG[facility]  Dept: [phone]  and follow the prompts.  Office hours are 8:00 a.m. to 4:30 p.m. Monday - Friday. Please note that voicemails left after 4:00 p.m. may not be returned until the following business day.  We are closed weekends and major holidays. You have access to a nurse at all times for urgent questions. Please call the main number to the clinic Dept: [phone] and follow the prompts.   For any non-urgent questions, you may also contact your provider using MyChart. We now offer e-Visits for anyone 18 and older to request care online for non-urgent symptoms. For details visit [email or web address].   Also download the MyChart app! Go to the app store, search "MyChart", open the app, select [facility], and log in with your MyChart username and password.  Masks are optional in the cancer centers. If you would like for your care team to wear a mask while they are taking care of you, please let them know. You may have one support person who is at least 48 years old accompany you for your appointments. 

## 2022-07-25 ENCOUNTER — Ambulatory Visit
Admission: RE | Admit: 2022-07-25 | Discharge: 2022-07-25 | Disposition: A | Discharge status: Home / Self Care | Payer: 59 | Source: Ambulatory Visit | Attending: Radiation Oncology | Admitting: Radiation Oncology

## 2022-07-25 ENCOUNTER — Other Ambulatory Visit: Payer: Self-pay

## 2022-07-25 DIAGNOSIS — Z170 Estrogen receptor positive status [ER+]: Secondary | ICD-10-CM | POA: Diagnosis not present

## 2022-07-25 DIAGNOSIS — Z79899 Other long term (current) drug therapy: Secondary | ICD-10-CM | POA: Diagnosis not present

## 2022-07-25 DIAGNOSIS — Z5112 Encounter for antineoplastic immunotherapy: Secondary | ICD-10-CM | POA: Diagnosis not present

## 2022-07-25 DIAGNOSIS — C50411 Malignant neoplasm of upper-outer quadrant of right female breast: Secondary | ICD-10-CM | POA: Diagnosis not present

## 2022-07-25 DIAGNOSIS — Z510 Encounter for antineoplastic radiation therapy: Secondary | ICD-10-CM | POA: Diagnosis not present

## 2022-07-25 LAB — RAD ONC ARIA SESSION SUMMARY
Course Elapsed Days: 7
Plan Fractions Treated to Date: 5
Plan Prescribed Dose Per Fraction: 2.66 Gy
Plan Total Fractions Prescribed: 16
Plan Total Prescribed Dose: 42.56 Gy
Reference Point Dosage Given to Date: 13.3 Gy
Reference Point Session Dosage Given: 2.66 Gy
Session Number: 5

## 2022-07-26 ENCOUNTER — Ambulatory Visit
Admission: RE | Admit: 2022-07-26 | Discharge: 2022-07-26 | Disposition: A | Discharge status: Home / Self Care | Payer: 59 | Source: Ambulatory Visit | Attending: Radiation Oncology | Admitting: Radiation Oncology

## 2022-07-26 ENCOUNTER — Other Ambulatory Visit: Payer: Self-pay

## 2022-07-26 DIAGNOSIS — Z79899 Other long term (current) drug therapy: Secondary | ICD-10-CM | POA: Diagnosis not present

## 2022-07-26 DIAGNOSIS — Z5112 Encounter for antineoplastic immunotherapy: Secondary | ICD-10-CM | POA: Diagnosis not present

## 2022-07-26 DIAGNOSIS — C50411 Malignant neoplasm of upper-outer quadrant of right female breast: Secondary | ICD-10-CM | POA: Diagnosis not present

## 2022-07-26 DIAGNOSIS — Z510 Encounter for antineoplastic radiation therapy: Secondary | ICD-10-CM | POA: Diagnosis not present

## 2022-07-26 DIAGNOSIS — Z170 Estrogen receptor positive status [ER+]: Secondary | ICD-10-CM | POA: Diagnosis not present

## 2022-07-26 LAB — RAD ONC ARIA SESSION SUMMARY
Course Elapsed Days: 8
Plan Fractions Treated to Date: 6
Plan Prescribed Dose Per Fraction: 2.66 Gy
Plan Total Fractions Prescribed: 16
Plan Total Prescribed Dose: 42.56 Gy
Reference Point Dosage Given to Date: 15.96 Gy
Reference Point Session Dosage Given: 2.66 Gy
Session Number: 6

## 2022-07-27 ENCOUNTER — Other Ambulatory Visit: Payer: Self-pay

## 2022-07-29 ENCOUNTER — Other Ambulatory Visit: Payer: Self-pay

## 2022-07-29 ENCOUNTER — Ambulatory Visit
Admission: RE | Admit: 2022-07-29 | Discharge: 2022-07-29 | Disposition: A | Discharge status: Home / Self Care | Payer: 59 | Source: Ambulatory Visit | Attending: Radiation Oncology | Admitting: Radiation Oncology

## 2022-07-29 DIAGNOSIS — Z510 Encounter for antineoplastic radiation therapy: Secondary | ICD-10-CM | POA: Diagnosis not present

## 2022-07-29 DIAGNOSIS — C50411 Malignant neoplasm of upper-outer quadrant of right female breast: Secondary | ICD-10-CM | POA: Diagnosis not present

## 2022-07-29 DIAGNOSIS — Z170 Estrogen receptor positive status [ER+]: Secondary | ICD-10-CM | POA: Diagnosis not present

## 2022-07-29 DIAGNOSIS — Z79899 Other long term (current) drug therapy: Secondary | ICD-10-CM | POA: Diagnosis not present

## 2022-07-29 DIAGNOSIS — Z5112 Encounter for antineoplastic immunotherapy: Secondary | ICD-10-CM | POA: Diagnosis not present

## 2022-07-29 LAB — RAD ONC ARIA SESSION SUMMARY
Course Elapsed Days: 11
Plan Fractions Treated to Date: 7
Plan Prescribed Dose Per Fraction: 2.66 Gy
Plan Total Fractions Prescribed: 16
Plan Total Prescribed Dose: 42.56 Gy
Reference Point Dosage Given to Date: 18.62 Gy
Reference Point Session Dosage Given: 2.66 Gy
Session Number: 7

## 2022-07-30 ENCOUNTER — Other Ambulatory Visit: Payer: Self-pay

## 2022-07-30 ENCOUNTER — Ambulatory Visit
Admission: RE | Admit: 2022-07-30 | Discharge: 2022-07-30 | Disposition: A | Discharge status: Home / Self Care | Payer: 59 | Source: Ambulatory Visit | Attending: Radiation Oncology | Admitting: Radiation Oncology

## 2022-07-30 DIAGNOSIS — Z510 Encounter for antineoplastic radiation therapy: Secondary | ICD-10-CM | POA: Diagnosis not present

## 2022-07-30 DIAGNOSIS — Z5112 Encounter for antineoplastic immunotherapy: Secondary | ICD-10-CM | POA: Diagnosis not present

## 2022-07-30 DIAGNOSIS — Z170 Estrogen receptor positive status [ER+]: Secondary | ICD-10-CM | POA: Diagnosis not present

## 2022-07-30 DIAGNOSIS — C50411 Malignant neoplasm of upper-outer quadrant of right female breast: Secondary | ICD-10-CM | POA: Diagnosis not present

## 2022-07-30 DIAGNOSIS — Z79899 Other long term (current) drug therapy: Secondary | ICD-10-CM | POA: Diagnosis not present

## 2022-07-30 LAB — RAD ONC ARIA SESSION SUMMARY
Course Elapsed Days: 12
Plan Fractions Treated to Date: 8
Plan Prescribed Dose Per Fraction: 2.66 Gy
Plan Total Fractions Prescribed: 16
Plan Total Prescribed Dose: 42.56 Gy
Reference Point Dosage Given to Date: 21.28 Gy
Reference Point Session Dosage Given: 2.66 Gy
Session Number: 8

## 2022-07-31 ENCOUNTER — Other Ambulatory Visit: Payer: Self-pay

## 2022-07-31 ENCOUNTER — Ambulatory Visit
Admission: RE | Admit: 2022-07-31 | Discharge: 2022-07-31 | Disposition: A | Discharge status: Home / Self Care | Payer: 59 | Source: Ambulatory Visit | Attending: Radiation Oncology | Admitting: Radiation Oncology

## 2022-07-31 DIAGNOSIS — Z79899 Other long term (current) drug therapy: Secondary | ICD-10-CM | POA: Diagnosis not present

## 2022-07-31 DIAGNOSIS — C50411 Malignant neoplasm of upper-outer quadrant of right female breast: Secondary | ICD-10-CM | POA: Diagnosis not present

## 2022-07-31 DIAGNOSIS — Z170 Estrogen receptor positive status [ER+]: Secondary | ICD-10-CM | POA: Diagnosis not present

## 2022-07-31 DIAGNOSIS — Z510 Encounter for antineoplastic radiation therapy: Secondary | ICD-10-CM | POA: Diagnosis not present

## 2022-07-31 DIAGNOSIS — Z5112 Encounter for antineoplastic immunotherapy: Secondary | ICD-10-CM | POA: Diagnosis not present

## 2022-07-31 LAB — RAD ONC ARIA SESSION SUMMARY
Course Elapsed Days: 13
Plan Fractions Treated to Date: 9
Plan Prescribed Dose Per Fraction: 2.66 Gy
Plan Total Fractions Prescribed: 16
Plan Total Prescribed Dose: 42.56 Gy
Reference Point Dosage Given to Date: 23.94 Gy
Reference Point Session Dosage Given: 2.66 Gy
Session Number: 9

## 2022-08-01 ENCOUNTER — Other Ambulatory Visit: Payer: Self-pay

## 2022-08-01 ENCOUNTER — Ambulatory Visit
Admission: RE | Admit: 2022-08-01 | Discharge: 2022-08-01 | Disposition: A | Discharge status: Home / Self Care | Payer: 59 | Source: Ambulatory Visit | Attending: Radiation Oncology | Admitting: Radiation Oncology

## 2022-08-01 DIAGNOSIS — C50411 Malignant neoplasm of upper-outer quadrant of right female breast: Secondary | ICD-10-CM | POA: Diagnosis not present

## 2022-08-01 DIAGNOSIS — Z170 Estrogen receptor positive status [ER+]: Secondary | ICD-10-CM | POA: Diagnosis not present

## 2022-08-01 DIAGNOSIS — Z510 Encounter for antineoplastic radiation therapy: Secondary | ICD-10-CM | POA: Diagnosis not present

## 2022-08-01 DIAGNOSIS — Z5112 Encounter for antineoplastic immunotherapy: Secondary | ICD-10-CM | POA: Diagnosis not present

## 2022-08-01 DIAGNOSIS — Z79899 Other long term (current) drug therapy: Secondary | ICD-10-CM | POA: Diagnosis not present

## 2022-08-01 LAB — RAD ONC ARIA SESSION SUMMARY
Course Elapsed Days: 14
Plan Fractions Treated to Date: 10
Plan Prescribed Dose Per Fraction: 2.66 Gy
Plan Total Fractions Prescribed: 16
Plan Total Prescribed Dose: 42.56 Gy
Reference Point Dosage Given to Date: 26.6 Gy
Reference Point Session Dosage Given: 2.66 Gy
Session Number: 10

## 2022-08-02 ENCOUNTER — Ambulatory Visit
Admission: RE | Admit: 2022-08-02 | Discharge: 2022-08-02 | Disposition: A | Discharge status: Home / Self Care | Payer: 59 | Source: Ambulatory Visit | Attending: Radiation Oncology | Admitting: Radiation Oncology

## 2022-08-02 ENCOUNTER — Other Ambulatory Visit: Payer: Self-pay

## 2022-08-02 DIAGNOSIS — Z5112 Encounter for antineoplastic immunotherapy: Secondary | ICD-10-CM | POA: Diagnosis not present

## 2022-08-02 DIAGNOSIS — Z510 Encounter for antineoplastic radiation therapy: Secondary | ICD-10-CM | POA: Diagnosis not present

## 2022-08-02 DIAGNOSIS — C50411 Malignant neoplasm of upper-outer quadrant of right female breast: Secondary | ICD-10-CM | POA: Diagnosis not present

## 2022-08-02 DIAGNOSIS — Z170 Estrogen receptor positive status [ER+]: Secondary | ICD-10-CM | POA: Diagnosis not present

## 2022-08-02 DIAGNOSIS — Z79899 Other long term (current) drug therapy: Secondary | ICD-10-CM | POA: Diagnosis not present

## 2022-08-02 LAB — RAD ONC ARIA SESSION SUMMARY
Course Elapsed Days: 15
Plan Fractions Treated to Date: 11
Plan Prescribed Dose Per Fraction: 2.66 Gy
Plan Total Fractions Prescribed: 16
Plan Total Prescribed Dose: 42.56 Gy
Reference Point Dosage Given to Date: 29.26 Gy
Reference Point Session Dosage Given: 2.66 Gy
Session Number: 11

## 2022-08-05 ENCOUNTER — Other Ambulatory Visit: Payer: Self-pay

## 2022-08-05 ENCOUNTER — Ambulatory Visit: Payer: 59

## 2022-08-05 ENCOUNTER — Ambulatory Visit
Admission: RE | Admit: 2022-08-05 | Discharge: 2022-08-05 | Disposition: A | Discharge status: Home / Self Care | Payer: 59 | Source: Ambulatory Visit | Attending: Radiation Oncology | Admitting: Radiation Oncology

## 2022-08-05 DIAGNOSIS — Z510 Encounter for antineoplastic radiation therapy: Secondary | ICD-10-CM | POA: Diagnosis not present

## 2022-08-05 DIAGNOSIS — C50411 Malignant neoplasm of upper-outer quadrant of right female breast: Secondary | ICD-10-CM | POA: Diagnosis not present

## 2022-08-05 DIAGNOSIS — Z79899 Other long term (current) drug therapy: Secondary | ICD-10-CM | POA: Diagnosis not present

## 2022-08-05 DIAGNOSIS — Z170 Estrogen receptor positive status [ER+]: Secondary | ICD-10-CM | POA: Diagnosis not present

## 2022-08-05 DIAGNOSIS — Z5112 Encounter for antineoplastic immunotherapy: Secondary | ICD-10-CM | POA: Diagnosis not present

## 2022-08-05 LAB — RAD ONC ARIA SESSION SUMMARY
Course Elapsed Days: 18
Plan Fractions Treated to Date: 12
Plan Prescribed Dose Per Fraction: 2.66 Gy
Plan Total Fractions Prescribed: 16
Plan Total Prescribed Dose: 42.56 Gy
Reference Point Dosage Given to Date: 31.92 Gy
Reference Point Session Dosage Given: 2.66 Gy
Session Number: 12

## 2022-08-06 ENCOUNTER — Other Ambulatory Visit: Payer: Self-pay

## 2022-08-06 ENCOUNTER — Ambulatory Visit
Admission: RE | Admit: 2022-08-06 | Discharge: 2022-08-06 | Disposition: A | Discharge status: Home / Self Care | Payer: 59 | Source: Ambulatory Visit | Attending: Radiation Oncology | Admitting: Radiation Oncology

## 2022-08-06 DIAGNOSIS — Z170 Estrogen receptor positive status [ER+]: Secondary | ICD-10-CM | POA: Diagnosis not present

## 2022-08-06 DIAGNOSIS — C50411 Malignant neoplasm of upper-outer quadrant of right female breast: Secondary | ICD-10-CM | POA: Diagnosis not present

## 2022-08-06 DIAGNOSIS — Z5112 Encounter for antineoplastic immunotherapy: Secondary | ICD-10-CM | POA: Diagnosis not present

## 2022-08-06 DIAGNOSIS — Z79899 Other long term (current) drug therapy: Secondary | ICD-10-CM | POA: Diagnosis not present

## 2022-08-06 DIAGNOSIS — Z510 Encounter for antineoplastic radiation therapy: Secondary | ICD-10-CM | POA: Diagnosis not present

## 2022-08-06 LAB — RAD ONC ARIA SESSION SUMMARY
Course Elapsed Days: 19
Plan Fractions Treated to Date: 13
Plan Prescribed Dose Per Fraction: 2.66 Gy
Plan Total Fractions Prescribed: 16
Plan Total Prescribed Dose: 42.56 Gy
Reference Point Dosage Given to Date: 34.58 Gy
Reference Point Session Dosage Given: 2.66 Gy
Session Number: 13

## 2022-08-07 ENCOUNTER — Other Ambulatory Visit: Payer: Self-pay

## 2022-08-07 ENCOUNTER — Ambulatory Visit
Admission: RE | Admit: 2022-08-07 | Discharge: 2022-08-07 | Disposition: A | Discharge status: Home / Self Care | Payer: 59 | Source: Ambulatory Visit | Attending: Radiation Oncology | Admitting: Radiation Oncology

## 2022-08-07 DIAGNOSIS — Z170 Estrogen receptor positive status [ER+]: Secondary | ICD-10-CM | POA: Diagnosis not present

## 2022-08-07 DIAGNOSIS — C50911 Malignant neoplasm of unspecified site of right female breast: Secondary | ICD-10-CM | POA: Diagnosis not present

## 2022-08-07 DIAGNOSIS — Z79899 Other long term (current) drug therapy: Secondary | ICD-10-CM | POA: Diagnosis not present

## 2022-08-07 DIAGNOSIS — Z5112 Encounter for antineoplastic immunotherapy: Secondary | ICD-10-CM | POA: Diagnosis not present

## 2022-08-07 DIAGNOSIS — Z510 Encounter for antineoplastic radiation therapy: Secondary | ICD-10-CM | POA: Diagnosis not present

## 2022-08-07 DIAGNOSIS — C50411 Malignant neoplasm of upper-outer quadrant of right female breast: Secondary | ICD-10-CM | POA: Diagnosis not present

## 2022-08-07 LAB — RAD ONC ARIA SESSION SUMMARY
Course Elapsed Days: 20
Plan Fractions Treated to Date: 14
Plan Prescribed Dose Per Fraction: 2.66 Gy
Plan Total Fractions Prescribed: 16
Plan Total Prescribed Dose: 42.56 Gy
Reference Point Dosage Given to Date: 37.24 Gy
Reference Point Session Dosage Given: 2.66 Gy
Session Number: 14

## 2022-08-08 ENCOUNTER — Other Ambulatory Visit: Payer: Self-pay

## 2022-08-08 ENCOUNTER — Encounter: Payer: Self-pay | Admitting: *Deleted

## 2022-08-08 ENCOUNTER — Ambulatory Visit: Payer: 59

## 2022-08-08 ENCOUNTER — Ambulatory Visit
Admission: RE | Admit: 2022-08-08 | Discharge: 2022-08-08 | Disposition: A | Discharge status: Home / Self Care | Payer: 59 | Source: Ambulatory Visit | Attending: Radiation Oncology | Admitting: Radiation Oncology

## 2022-08-08 DIAGNOSIS — Z510 Encounter for antineoplastic radiation therapy: Secondary | ICD-10-CM | POA: Diagnosis not present

## 2022-08-08 DIAGNOSIS — Z170 Estrogen receptor positive status [ER+]: Secondary | ICD-10-CM | POA: Diagnosis not present

## 2022-08-08 DIAGNOSIS — C50411 Malignant neoplasm of upper-outer quadrant of right female breast: Secondary | ICD-10-CM | POA: Diagnosis not present

## 2022-08-08 DIAGNOSIS — Z5112 Encounter for antineoplastic immunotherapy: Secondary | ICD-10-CM | POA: Diagnosis not present

## 2022-08-08 DIAGNOSIS — Z79899 Other long term (current) drug therapy: Secondary | ICD-10-CM | POA: Diagnosis not present

## 2022-08-08 LAB — RAD ONC ARIA SESSION SUMMARY
Course Elapsed Days: 21
Plan Fractions Treated to Date: 15
Plan Prescribed Dose Per Fraction: 2.66 Gy
Plan Total Fractions Prescribed: 16
Plan Total Prescribed Dose: 42.56 Gy
Reference Point Dosage Given to Date: 39.9 Gy
Reference Point Session Dosage Given: 2.66 Gy
Session Number: 15

## 2022-08-09 ENCOUNTER — Ambulatory Visit
Admission: RE | Admit: 2022-08-09 | Discharge: 2022-08-09 | Disposition: A | Discharge status: Home / Self Care | Payer: 59 | Source: Ambulatory Visit | Attending: Radiation Oncology | Admitting: Radiation Oncology

## 2022-08-09 ENCOUNTER — Other Ambulatory Visit: Payer: Self-pay

## 2022-08-09 ENCOUNTER — Encounter: Payer: Self-pay | Admitting: Radiation Oncology

## 2022-08-09 ENCOUNTER — Other Ambulatory Visit (HOSPITAL_COMMUNITY): Payer: Self-pay | Admitting: *Deleted

## 2022-08-09 DIAGNOSIS — Z510 Encounter for antineoplastic radiation therapy: Secondary | ICD-10-CM | POA: Diagnosis not present

## 2022-08-09 DIAGNOSIS — Z170 Estrogen receptor positive status [ER+]: Secondary | ICD-10-CM | POA: Diagnosis not present

## 2022-08-09 DIAGNOSIS — C50411 Malignant neoplasm of upper-outer quadrant of right female breast: Secondary | ICD-10-CM | POA: Diagnosis not present

## 2022-08-09 DIAGNOSIS — Z79899 Other long term (current) drug therapy: Secondary | ICD-10-CM | POA: Diagnosis not present

## 2022-08-09 DIAGNOSIS — I427 Cardiomyopathy due to drug and external agent: Secondary | ICD-10-CM

## 2022-08-09 DIAGNOSIS — Z5112 Encounter for antineoplastic immunotherapy: Secondary | ICD-10-CM | POA: Diagnosis not present

## 2022-08-09 LAB — RAD ONC ARIA SESSION SUMMARY
Course Elapsed Days: 22
Plan Fractions Treated to Date: 16
Plan Prescribed Dose Per Fraction: 2.66 Gy
Plan Total Fractions Prescribed: 16
Plan Total Prescribed Dose: 42.56 Gy
Reference Point Dosage Given to Date: 42.56 Gy
Reference Point Session Dosage Given: 2.66 Gy
Session Number: 16

## 2022-08-11 NOTE — Progress Notes (Signed)
CARDIO-ONCOLOGY CLINIC NOTE  Referring Physician: Dr. Chryl Heck Primary Care: Pcp, No Primary Cardiologist: New  HPI:  Tonya Blair is a 46 y.o. female with right  breast cancer referred by Dr. Chryl Heck for enrollment into the Cardio-Oncology program.   Oncology History  Malignant neoplasm of upper-outer quadrant of right breast in female, estrogen receptor positive ([place])  12/31/2021 Initial Diagnosis    Malignant neoplasm of upper-outer quadrant of right breast in female, estrogen receptor positive ([place])      01/02/2022 Cancer Staging    Staging form: Breast, AJCC 8th Edition - Clinical stage from 01/02/2022: Stage IB (cT2, cN0, cM0, G3, ER+, PR+, HER2+) - Signed by Benay Pike, MD on 01/02/2022 Histologic grading system: 3 grade system      01/14/2022 Genetic Testing    Negative hereditary cancer genetic testing: no pathogenic variants detected in [facility] CustomNext-Cancer +RNAinsight Panel.  The report date is January 14, 2022.   The CustomNext-Cancer+RNAinsight panel offered by [facility] includes sequencing and rearrangement analysis for the following 47 genes:  APC, ATM, AXIN2, BARD1, BMPR1A, BRCA1, BRCA2, BRIP1, CDH1, CDK4, CDKN2A, CHEK2, DICER1, EPCAM, GREM1, HOXB13, MEN1, MLH1, MSH2, MSH3, MSH6, MUTYH, NBN, NF1, NF2, NTHL1, PALB2, PMS2, POLD1, POLE, PTEN, RAD51C, RAD51D, RECQL, RET, SDHA, SDHAF2, SDHB, SDHC, SDHD, SMAD4, SMARCA4, STK11, TP53, TSC1, TSC2, and VHL.  RNA data is routinely analyzed for use in variant interpretation for all genes.    01/16/2022 -  Chemotherapy    Patient is on Treatment Plan : BREAST  Docetaxel + Carboplatin + Trastuzumab + Pertuzumab  (TCHP) q21d            ONCOLOGIC HISTORY: 1) 12/11/2021: Diagnostic mammogram and ultrasound showed suspicious mass in the 9 o'clock region of the right breast. No evidence of enlarged adenopathy.   2) 12/25/2021: Underwent US guided right breast core needle biopsy of breast mass at 9 o'clock. 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  Prognostics from this mass showed ER 70% moderate, PR 20% moderate, HER2 positive by IHC, Ki-67 of 30%.   3) 01/09/2022:  MR Breast Bilateral: Biopsy proven malignancy in the outer right breast with associated linear/nodular enhancement extending anteriorly altogether measuring up to 5.1 cm. Indeterminate 0.6 cm tubular enhancing mass in the outer left breast.   4) 01/16/2022: Initiated neoadjuvant therapy with TCHP q 21 days given T2 tumor with HER2 amplification.    5) 01/17/2022: Underwent MRI guided biopsy of indeterminate 6 mm enhancing mass over the left outer lower quadrant. 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 No evidence of malignancy.    6) Completed neoadjuvant chemotherapy TCHP from 01/16/2022 to 05/01/2022  Got H/P on 5/30 and further dosing stopped due to reduced EF on echo . We saw her for the first time in 7/23 and Herceptin/Perjeta restarted. Toprol and losartan started. She is here for f/u echo.   Doing well. Active. No SOB, CP, edema. Struggling with low BP on losartan 25 and Toprol 25   Echo today 08/12/22 EF 55-60% GLS -24.3% Personally reviewed    ECHO: 12/29/21 EF 60-65% GLS -22.5% 03/29/22 EF 60-65% GLS -19.7 % (underestimated on 3-chamber)  06/26/22 EF 53% GLS -18.8%   Past Medical History:  Diagnosis Date   Anemia    IDA   Cancer ([place])    right breast cancer   Family history of prostate cancer 01/02/2022    Current Outpatient Medications  Medication Sig Dispense Refill  Ascorbic Acid (VITAMIN C PO) Take 1 tablet by mouth in the morning.     BIOTIN PO Take 1 tablet by mouth in the morning.     Cholecalciferol (VITAMIN D3 PO) Take 1 tablet by mouth in the morning.     COD LIVER OIL PO Take by mouth.     dexamethasone (DECADRON) 4 MG tablet Take 2 tablets (8 mg total) by mouth 2 (two) times daily. Start the day before Taxotere. Then take  daily x 3 days after chemotherapy. 30 tablet 1   ibuprofen (ADVIL) 200 MG tablet Take 200 mg by mouth every 8 (eight) hours as needed (pain.).     levonorgestrel (MIRENA, 52 MG,) 20 MCG/DAY IUD Mirena 20 mcg/24 hours (8 yrs) 52 mg intrauterine device     lidocaine-prilocaine (EMLA) cream Apply to affected area once 30 g 3   LORazepam (ATIVAN) 0.5 MG tablet Take 1 tablet (0.5 mg total) by mouth every 6 (six) hours as needed (Nausea or vomiting). 30 tablet 0   losartan (COZAAR) 25 MG tablet Take 1 tablet (25 mg total) by mouth daily. 30 tablet 3   metoprolol succinate (TOPROL XL) 25 MG 24 hr tablet Take 1 tablet (25 mg total) by mouth daily. 30 tablet 3   Multiple Minerals-Vitamins (CAL MAG ZINC +D3 PO) Take 1 tablet by mouth in the morning.     ondansetron (ZOFRAN) 8 MG tablet Take 1 tablet (8 mg total) by mouth 2 (two) times daily as needed (Nausea or vomiting). Start on the third day after chemotherapy. 30 tablet 1   OVER THE COUNTER MEDICATION Take 1 capsule by mouth in the morning. Sea Moss Superfood Capsule     oxyCODONE (OXY IR/ROXICODONE) 5 MG immediate release tablet Take 1 tablet (5 mg total) by mouth every 6 (six) hours as needed. 10 tablet 0   prochlorperazine (COMPAZINE) 10 MG tablet Take 1 tablet (10 mg total) by mouth every 6 (six) hours as needed (Nausea or vomiting). 30 tablet 1   traMADol (ULTRAM) 50 MG tablet Take 2 tablets (100 mg total) by mouth every 6 (six) hours as needed. 10 tablet 0   Turmeric (QC TUMERIC COMPLEX PO) Take by mouth.     No current facility-administered medications for this encounter.    Allergies  Allergen Reactions   Codeine Nausea And Vomiting   Kiwi Extract Itching and Swelling      Social History   Socioeconomic History   Marital status: Single    Spouse name: Not on file   Number of children: Not on file   Years of education: Not on file   Highest education level: Not on file  Occupational History   Not on file  Tobacco Use   Smoking  status: Never   Smokeless tobacco: Never  Vaping Use   Vaping Use: Never used  Substance and Sexual Activity   Alcohol use: Yes    Comment: occasional wine   Drug use: Never   Sexual activity: Yes    Birth control/protection: I.U.D.    Comment: Mirena IUD  Other Topics Concern   Not on file  Social History Narrative   Not on file   Social Determinants of Health   Financial Resource Strain: Low Risk  (01/02/2022)   Overall Financial Resource Strain (CARDIA)    Difficulty of Paying Living Expenses: Not hard at all  Food Insecurity: No Food Insecurity (01/02/2022)   Hunger Vital Sign    Worried About Running Out of Food in the  Last Year: Never true    [place] in the Last Year: Never true  Transportation Needs: No Transportation Needs (01/02/2022)   PRAPARE - Hydrologist (Medical): No    Lack of Transportation (Non-Medical): No  Physical Activity: Not on file  Stress: Not on file  Social Connections: Not on file  Intimate Partner Violence: Not on file      Family History  Problem Relation Age of Onset   Prostate cancer Father 20    There were no vitals filed for this visit.   PHYSICAL EXAM: General:  Well appearing. No respiratory difficulty HEENT: normal Neck: supple. no JVD. Carotids 2+ bilat; no bruits. No lymphadenopathy or thryomegaly appreciated. Cor: PMI nondisplaced. Regular rate & rhythm. No rubs, gallops or murmurs. Lungs: clear Abdomen: soft, nontender, nondistended. No hepatosplenomegaly. No bruits or masses. Good bowel sounds. Extremities: no cyanosis, clubbing, rash, edema Neuro: alert & oriented x 3, cranial nerves grossly intact. moves all 4 extremities w/o difficulty. Affect pleasant.    ASSESSMENT & PLAN:  1. 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 Diagnosed 1/23  - Completed neoadjuvant chemotherapy TCHP from 01/16/2022 to 05/01/2022 - Scheduled for H/P  q21 days. Received 1 dose 05/21/22 -> stopped due to decreased EF - Echo 12/29/21 EF 60-65% GLS -22.5% - Echo 03/29/22 EF 60-65% GLS -19.7 % (underestimated on 3-chamber)  - Echo 06/26/22 EF 53% GLS -18.8% - Echo today 08/12/22 EF 55-60% GLS -24.3% Personally reviewed - I suspect she had mild herceptin/perjeta cardiotoxicity. Now improved - Struggling with low BP on losartan 25 and Toprol 25 will decrease each dose to 12.5 daily - Continue Herceptin/Perjeta. Follow echo q 3 months.    Tonya Bickers, MD  10:48 PM

## 2022-08-12 ENCOUNTER — Ambulatory Visit (HOSPITAL_BASED_OUTPATIENT_CLINIC_OR_DEPARTMENT_OTHER)
Admission: RE | Admit: 2022-08-12 | Discharge: 2022-08-12 | Disposition: A | Discharge status: Home / Self Care | Payer: 59 | Source: Ambulatory Visit | Attending: Internal Medicine | Admitting: Internal Medicine

## 2022-08-12 ENCOUNTER — Other Ambulatory Visit: Payer: Self-pay

## 2022-08-12 ENCOUNTER — Encounter (HOSPITAL_COMMUNITY): Payer: Self-pay | Admitting: Internal Medicine

## 2022-08-12 ENCOUNTER — Ambulatory Visit (HOSPITAL_COMMUNITY)
Admission: RE | Admit: 2022-08-12 | Discharge: 2022-08-12 | Disposition: A | Discharge status: Home / Self Care | Payer: 59 | Source: Ambulatory Visit | Attending: Internal Medicine | Admitting: Internal Medicine

## 2022-08-12 VITALS — BP 104/70 | HR 79 | Wt 184.8 lb

## 2022-08-12 DIAGNOSIS — I959 Hypotension, unspecified: Secondary | ICD-10-CM | POA: Insufficient documentation

## 2022-08-12 DIAGNOSIS — Z0189 Encounter for other specified special examinations: Secondary | ICD-10-CM | POA: Diagnosis not present

## 2022-08-12 DIAGNOSIS — T451X5A Adverse effect of antineoplastic and immunosuppressive drugs, initial encounter: Secondary | ICD-10-CM | POA: Diagnosis not present

## 2022-08-12 DIAGNOSIS — C50411 Malignant neoplasm of upper-outer quadrant of right female breast: Secondary | ICD-10-CM

## 2022-08-12 DIAGNOSIS — Z79899 Other long term (current) drug therapy: Secondary | ICD-10-CM | POA: Insufficient documentation

## 2022-08-12 DIAGNOSIS — Z9221 Personal history of antineoplastic chemotherapy: Secondary | ICD-10-CM | POA: Diagnosis not present

## 2022-08-12 DIAGNOSIS — I429 Cardiomyopathy, unspecified: Secondary | ICD-10-CM | POA: Insufficient documentation

## 2022-08-12 DIAGNOSIS — Z170 Estrogen receptor positive status [ER+]: Secondary | ICD-10-CM | POA: Diagnosis not present

## 2022-08-12 DIAGNOSIS — I427 Cardiomyopathy due to drug and external agent: Secondary | ICD-10-CM

## 2022-08-12 LAB — ECHOCARDIOGRAM COMPLETE
Area-P 1/2: 3.95 cm2
S' Lateral: 2.9 cm

## 2022-08-12 MED ORDER — METOPROLOL SUCCINATE ER 25 MG PO TB24: 12.5000 mg | ORAL_TABLET | Freq: Every day | ORAL | 3 refills | Status: DC

## 2022-08-12 MED ORDER — LOSARTAN POTASSIUM 25 MG PO TABS: 12.5000 mg | ORAL_TABLET | Freq: Every day | ORAL | 3 refills | Status: DC

## 2022-08-12 NOTE — Patient Instructions (Signed)
Decrease Losartan to 12.5 mg (1/2 tab) Daily AT BEDTIME  Decrease Metoprolol XL to 12.5 mg (1/2 tab) Daily AT BEDTIME  Your physician recommends that you schedule a follow-up appointment in: 3 months with an echocardiogram  If you have any questions or concerns before your next appointment please send [place] a message through [place] or call our office at [phone].    TO LEAVE A MESSAGE FOR THE NURSE SELECT OPTION 2, PLEASE LEAVE A MESSAGE INCLUDING: YOUR NAME DATE OF BIRTH CALL BACK NUMBER REASON FOR CALL**this is important as we prioritize the call backs  YOU WILL RECEIVE A CALL BACK THE SAME DAY AS LONG AS YOU CALL BEFORE 4:00 PM

## 2022-08-12 NOTE — Addendum Note (Signed)
Encounter addended by: Jolaine Artist, MD on: 08/12/2022 5:19 PM  Actions taken: Level of Service modified

## 2022-08-14 ENCOUNTER — Encounter: Payer: Self-pay | Admitting: Hematology and Oncology

## 2022-08-14 ENCOUNTER — Other Ambulatory Visit: Payer: Self-pay

## 2022-08-14 ENCOUNTER — Inpatient Hospital Stay: Payer: 59

## 2022-08-14 ENCOUNTER — Inpatient Hospital Stay (HOSPITAL_BASED_OUTPATIENT_CLINIC_OR_DEPARTMENT_OTHER): Payer: 59 | Admitting: Hematology and Oncology

## 2022-08-14 ENCOUNTER — Other Ambulatory Visit: Payer: Self-pay | Admitting: Hematology and Oncology

## 2022-08-14 VITALS — BP 119/75 | HR 84 | Temp 98.0°F | Resp 16

## 2022-08-14 DIAGNOSIS — Z170 Estrogen receptor positive status [ER+]: Secondary | ICD-10-CM

## 2022-08-14 DIAGNOSIS — Z95828 Presence of other vascular implants and grafts: Secondary | ICD-10-CM

## 2022-08-14 DIAGNOSIS — C50411 Malignant neoplasm of upper-outer quadrant of right female breast: Secondary | ICD-10-CM

## 2022-08-14 DIAGNOSIS — Z510 Encounter for antineoplastic radiation therapy: Secondary | ICD-10-CM | POA: Diagnosis not present

## 2022-08-14 DIAGNOSIS — Z5112 Encounter for antineoplastic immunotherapy: Secondary | ICD-10-CM | POA: Diagnosis not present

## 2022-08-14 DIAGNOSIS — Z79899 Other long term (current) drug therapy: Secondary | ICD-10-CM | POA: Diagnosis not present

## 2022-08-14 LAB — CBC WITH DIFFERENTIAL/PLATELET
Abs Immature Granulocytes: 0.01 10*3/uL (ref 0.00–0.07)
Basophils Absolute: 0 10*3/uL (ref 0.0–0.1)
Basophils Relative: 1 %
Eosinophils Absolute: 0.2 10*3/uL (ref 0.0–0.5)
Eosinophils Relative: 5 %
HCT: 32.9 % — ABNORMAL LOW (ref 36.0–46.0)
Hemoglobin: 10.9 g/dL — ABNORMAL LOW (ref 12.0–15.0)
Immature Granulocytes: 0 %
Lymphocytes Relative: 29 %
Lymphs Abs: 1.3 10*3/uL (ref 0.7–4.0)
MCH: 26.7 pg (ref 26.0–34.0)
MCHC: 33.1 g/dL (ref 30.0–36.0)
MCV: 80.6 fL (ref 80.0–100.0)
Monocytes Absolute: 0.4 10*3/uL (ref 0.1–1.0)
Monocytes Relative: 10 %
Neutro Abs: 2.3 10*3/uL (ref 1.7–7.7)
Neutrophils Relative %: 55 %
Platelets: 243 10*3/uL (ref 150–400)
RBC: 4.08 MIL/uL (ref 3.87–5.11)
RDW: 14.8 % (ref 11.5–15.5)
WBC: 4.3 10*3/uL (ref 4.0–10.5)
nRBC: 0 % (ref 0.0–0.2)

## 2022-08-14 LAB — COMPREHENSIVE METABOLIC PANEL
ALT: 16 U/L (ref 0–44)
AST: 15 U/L (ref 15–41)
Albumin: 4.2 g/dL (ref 3.5–5.0)
Alkaline Phosphatase: 79 U/L (ref 38–126)
Anion gap: 5 (ref 5–15)
BUN: 16 mg/dL (ref 6–20)
CO2: 27 mmol/L (ref 22–32)
Calcium: 9.7 mg/dL (ref 8.9–10.3)
Chloride: 108 mmol/L (ref 98–111)
Creatinine, Ser: 0.8 mg/dL (ref 0.44–1.00)
GFR, Estimated: >60 mL/min (ref >60)
Glucose, Bld: 137 mg/dL — ABNORMAL HIGH (ref 70–99)
Potassium: 3.8 mmol/L (ref 3.5–5.1)
Sodium: 140 mmol/L (ref 135–145)
Total Bilirubin: 0.3 mg/dL (ref 0.3–1.2)
Total Protein: 6.8 g/dL (ref 6.5–8.1)

## 2022-08-14 MED ORDER — TRASTUZUMAB-DKST CHEMO 150 MG IV SOLR
6.0000 mg/kg | Freq: Once | INTRAVENOUS | Status: AC
  Administered 2022-08-14: 546 mg via INTRAVENOUS
  Filled 2022-08-14: qty 26

## 2022-08-14 MED ORDER — HEPARIN SOD (PORK) LOCK FLUSH 100 UNIT/ML IV SOLN
500.0000 [IU] | Freq: Once | INTRAVENOUS | Status: AC | PRN
  Administered 2022-08-14: 500 [IU]

## 2022-08-14 MED ORDER — ACETAMINOPHEN 325 MG PO TABS
650.0000 mg | ORAL_TABLET | Freq: Once | ORAL | Status: AC
  Administered 2022-08-14: 650 mg via ORAL
  Filled 2022-08-14: qty 2

## 2022-08-14 MED ORDER — SODIUM CHLORIDE 0.9% FLUSH
10.0000 mL | Freq: Once | INTRAVENOUS | Status: AC
  Administered 2022-08-14: 10 mL

## 2022-08-14 MED ORDER — TAMOXIFEN CITRATE 20 MG PO TABS: 20.0000 mg | ORAL_TABLET | Freq: Every day | ORAL | 3 refills | Status: DC

## 2022-08-14 MED ORDER — SODIUM CHLORIDE 0.9% FLUSH
10.0000 mL | INTRAVENOUS | Status: DC | PRN
  Administered 2022-08-14: 10 mL

## 2022-08-14 MED ORDER — SODIUM CHLORIDE 0.9 % IV SOLN: Freq: Once | INTRAVENOUS | Status: AC

## 2022-08-14 MED ORDER — SODIUM CHLORIDE 0.9 % IV SOLN
420.0000 mg | Freq: Once | INTRAVENOUS | Status: AC
  Administered 2022-08-14: 420 mg via INTRAVENOUS
  Filled 2022-08-14: qty 14

## 2022-08-14 NOTE — Progress Notes (Signed)
Patient did well with treatment today - no signs of any reactions. Chose not to stay for her 30 minute post perjeta observation. VSS- BP 119/75 (BP Location: Left Arm, Patient Position: Sitting)   Pulse 84   Temp 98 F (36.7 C) (Oral)   Resp 16   SpO2 100%

## 2022-08-14 NOTE — Patient Instructions (Signed)
[facility] MEDICAL ONCOLOGY   Discharge Instructions: Thank you for choosing [facility] to provide your oncology and hematology care.   If you have a lab appointment with the Cancer Center, please go directly to the Cancer Center and check in at the registration area.   Wear comfortable clothing and clothing appropriate for easy access to any Portacath or PICC line.   We strive to give you quality time with your provider. You may need to reschedule your appointment if you arrive late (15 or more minutes).  Arriving late affects you and other patients whose appointments are after yours.  Also, if you miss three or more appointments without notifying the office, you may be dismissed from the clinic at the provider's discretion.      For prescription refill requests, have your pharmacy contact our office and allow 72 hours for refills to be completed.    Today you received the following chemotherapy and/or immunotherapy agents: Trastuzumab (Herceptin) and Pertuzumab (Perjeta)      To help prevent nausea and vomiting after your treatment, we encourage you to take your nausea medication as directed.  BELOW ARE SYMPTOMS THAT SHOULD BE REPORTED IMMEDIATELY: *FEVER GREATER THAN 100.4 F (38 C) OR HIGHER *CHILLS OR SWEATING *NAUSEA AND VOMITING THAT IS NOT CONTROLLED WITH YOUR NAUSEA MEDICATION *UNUSUAL SHORTNESS OF BREATH *UNUSUAL BRUISING OR BLEEDING *URINARY PROBLEMS (pain or burning when urinating, or frequent urination) *BOWEL PROBLEMS (unusual diarrhea, constipation, pain near the anus) TENDERNESS IN MOUTH AND THROAT WITH OR WITHOUT PRESENCE OF ULCERS (sore throat, sores in mouth, or a toothache) UNUSUAL RASH, SWELLING OR PAIN  UNUSUAL VAGINAL DISCHARGE OR ITCHING   Items with * indicate a potential emergency and should be followed up as soon as possible or go to the Emergency Department if any problems should occur.  Please show the CHEMOTHERAPY ALERT CARD or  IMMUNOTHERAPY ALERT CARD at check-in to the Emergency Department and triage nurse.  Should you have questions after your visit or need to cancel or reschedule your appointment, please contact [facility]ICAL [facility]  Dept: [phone]  and follow the prompts.  Office hours are 8:00 a.m. to 4:30 p.m. Monday - Friday. Please note that voicemails left after 4:00 p.m. may not be returned until the following business day.  We are closed weekends and major holidays. You have access to a nurse at all times for urgent questions. Please call the main number to the clinic Dept: [phone] and follow the prompts.   For any non-urgent questions, you may also contact your provider using MyChart. We now offer e-Visits for anyone 18 and older to request care online for non-urgent symptoms. For details visit [email or web address].   Also download the MyChart app! Go to the app store, search "MyChart", open the app, select [facility], and log in with your MyChart username and password.  Masks are optional in the cancer centers. If you would like for your care team to wear a mask while they are taking care of you, please let them know. You may have one support person who is at least 48 years old accompany you for your appointments. 

## 2022-08-14 NOTE — Assessment & Plan Note (Addendum)
#  Right breast invasive ductal carcinoma --Biopsy proved on 12/25/2021. Pathology showed invasive ductal carcinoma grade 3, ductal carcinoma in situ and calcifications, 5 cm from the nipple.  Prognostics from this mass showed ER 70% moderate, PR 20% moderate, HER2 positive by IHC, Ki-67 of 30%. --Recommendation is neoadjuvant TCHP q 21 days given T2 tumor with HER2 amplification. -- She completed neoadjuvant chemo, tolerated it very well -- Clinically and radiologically complete response --- She had lumpectomy and sentinel lymph nodes, complete pathologic response.  At this time we will recommend continuing adjuvant Herceptin and pertuzumab for a total duration of 1 year and consider adjuvant antiestrogen therapy after completion of adjuvant radiation --EF showed a slight drop, following with cardiac oncology, Dr Haroldine Laws, ok to continue herceptin and perjeta. -Today we have discussed about tamoxifen, mechanism of action, adverse effects of tamoxifen, including but not limited to postmenopausal symptoms, increased risk of DVT/PE, increased risk of endometrial cancer and endometrial thickening.  A benefit of tamoxifen would be improvement in bone density besides reducing risk of breast cancer recurrence.  She is willing to start antiestrogen therapy, this has been dispensed to the pharmacy of her choice.  She will continue follow-up with [place] every 6 weeks. She will proceed with her mammogram in December 2023.

## 2022-08-14 NOTE — Progress Notes (Signed)
[facility]  PROGRESS NOTE  Patient Care Team: Pcp, No as PCP - General Mauro Kaufmann, RN as Oncology Nurse Navigator Rockwell Germany, RN as Oncology Nurse Navigator Rolm Bookbinder, MD as Consulting Physician (General Surgery) Benay Pike, MD as Consulting Physician (Hematology and Oncology) Eppie Gibson, MD as Attending Physician (Radiation Oncology)  CHIEF COMPLAINTS:  Right breast invasive ductal carcinoma.  Oncology History  Malignant neoplasm of upper-outer quadrant of right breast in female, estrogen receptor positive ([place])  12/31/2021 Initial Diagnosis   Malignant neoplasm of upper-outer quadrant of right breast in female, estrogen receptor positive ([place])   01/02/2022 Cancer Staging   Staging form: Breast, AJCC 8th Edition - Clinical stage from 01/02/2022: Stage IB (cT2, cN0, cM0, G3, ER+, PR+, HER2+) - Signed by Benay Pike, MD on 01/02/2022 Histologic grading system: 3 grade system   01/14/2022 Genetic Testing   Negative hereditary cancer genetic testing: no pathogenic variants detected in [facility] CustomNext-Cancer +RNAinsight Panel.  The report date is January 14, 2022.  The CustomNext-Cancer+RNAinsight panel offered by [facility] includes sequencing and rearrangement analysis for the following 47 genes:  APC, ATM, AXIN2, BARD1, BMPR1A, BRCA1, BRCA2, BRIP1, CDH1, CDK4, CDKN2A, CHEK2, DICER1, EPCAM, GREM1, HOXB13, MEN1, MLH1, MSH2, MSH3, MSH6, MUTYH, NBN, NF1, NF2, NTHL1, PALB2, PMS2, POLD1, POLE, PTEN, RAD51C, RAD51D, RECQL, RET, SDHA, SDHAF2, SDHB, SDHC, SDHD, SMAD4, SMARCA4, STK11, TP53, TSC1, TSC2, and VHL.  RNA data is routinely analyzed for use in variant interpretation for all genes.   01/16/2022 -  Chemotherapy   Completed chemotherapy May 2023, received 6 cycles of neoadjuvant TCHP Patient is on Treatment Plan : BREAST  Docetaxel + Carboplatin + Trastuzumab + Pertuzumab  (TCHP) q21d      06/19/2022 Definitive Surgery   Right breast  lumpectomy showed fibrosis with mild inflammation, negative for residual carcinoma, all surgical margins negative for carcinoma, right axillary sentinel lymph node negative for metastatic carcinoma, final pathologic staging PT0PN0    Interval history   Tonya Blair 46 y.o. female is here after definitive surgery. She had complete pathologic response to neoadjuvant chemotherapy.   She is currently on maintenance with Herceptin and pertuzumab She has one day of diarrhea with pertuzumab, otherwise tolerating it well. Most recent echocardiogram from August 21 with ejection fraction of 55 to 60%. Rest of the pertinent 10 point ROS reviewed and negative.  MEDICAL HISTORY:  Past Medical History:  Diagnosis Date   Anemia    IDA   Cancer ([place])    right breast cancer   Family history of prostate cancer 01/02/2022    SURGICAL HISTORY: Past Surgical History:  Procedure Laterality Date   BREAST BIOPSY Right 12/25/2021   BREAST LUMPECTOMY WITH RADIOACTIVE SEED AND SENTINEL LYMPH NODE BIOPSY Right 06/19/2022   Procedure: RADIOACTIVE SEED GUIDED RIGHT BREAST LUMPECTOMY, RIGHT AXILLARY SENTINEL LYMPH NODE BIOPSY;  Surgeon: Rolm Bookbinder, MD;  Location: [facility];  Service: General;  Laterality: Right;   PORTACATH PLACEMENT N/A 01/14/2022   Procedure: INSERTION PORT-A-CATH;  Surgeon: Rolm Bookbinder, MD;  Location: [facility];  Service: General;  Laterality: N/A;   THERAPEUTIC ABORTION     WISDOM TOOTH EXTRACTION      SOCIAL HISTORY: Social History   Socioeconomic History   Marital status: Single    Spouse name: Not on file   Number of children: Not on file   Years of education: Not on file   Highest education level: Not on file  Occupational History   Not on file  Tobacco Use   Smoking status: Never   Smokeless tobacco: Never  Vaping Use   Vaping Use: Never used  Substance and Sexual Activity   Alcohol use: Yes    Comment: occasional wine   Drug use: Never    Sexual activity: Yes    Birth control/protection: I.U.D.    Comment: Mirena IUD  Other Topics Concern   Not on file  Social History Narrative   Not on file   Social Determinants of Health   Financial Resource Strain: Low Risk  (01/02/2022)   Overall Financial Resource Strain (CARDIA)    Difficulty of Paying Living Expenses: Not hard at all  Food Insecurity: No Food Insecurity (01/02/2022)   Hunger Vital Sign    Worried About Running Out of Food in the Last Year: Never true    Ran Out of Food in the Last Year: Never true  Transportation Needs: No Transportation Needs (01/02/2022)   PRAPARE - Hydrologist (Medical): No    Lack of Transportation (Non-Medical): No  Physical Activity: Not on file  Stress: Not on file  Social Connections: Not on file  Intimate Partner Violence: Not on file    FAMILY HISTORY: Family History  Problem Relation Age of Onset   Prostate cancer Father 33    ALLERGIES:  is allergic to codeine and kiwi extract.  MEDICATIONS:  Current Outpatient Medications  Medication Sig Dispense Refill   tamoxifen (NOLVADEX) 20 MG tablet Take 1 tablet (20 mg total) by mouth daily. 90 tablet 3   Ascorbic Acid (VITAMIN C PO) Take 1 tablet by mouth in the morning.     BIOTIN PO Take 1 tablet by mouth in the morning.     Cholecalciferol (VITAMIN D3 PO) Take 1 tablet by mouth in the morning.     COD LIVER OIL PO Take by mouth.     ibuprofen (ADVIL) 200 MG tablet Take 200 mg by mouth every 8 (eight) hours as needed (pain.).     levonorgestrel (MIRENA, 52 MG,) 20 MCG/DAY IUD Mirena 20 mcg/24 hours (8 yrs) 52 mg intrauterine device     lidocaine-prilocaine (EMLA) cream Apply to affected area once 30 g 3   losartan (COZAAR) 25 MG tablet Take 0.5 tablets (12.5 mg total) by mouth at bedtime. 15 tablet 3   metoprolol succinate (TOPROL XL) 25 MG 24 hr tablet Take 0.5 tablets (12.5 mg total) by mouth at bedtime. 15 tablet 3   Multiple Minerals-Vitamins  (CAL MAG ZINC +D3 PO) Take 1 tablet by mouth in the morning.     OVER THE COUNTER MEDICATION Take 1 capsule by mouth in the morning. Sea Moss Superfood Capsule     oxyCODONE (OXY IR/ROXICODONE) 5 MG immediate release tablet Take 1 tablet (5 mg total) by mouth every 6 (six) hours as needed. 10 tablet 0   traMADol (ULTRAM) 50 MG tablet Take 2 tablets (100 mg total) by mouth every 6 (six) hours as needed. 10 tablet 0   Turmeric (QC TUMERIC COMPLEX PO) Take by mouth.     No current facility-administered medications for this visit.    PHYSICAL EXAMINATION: ECOG PERFORMANCE STATUS: 1 - Symptomatic but completely ambulatory  Vitals:   08/14/22 0842  BP: (!) 97/46  Pulse: 85  Resp: 16  Temp: 97.8 F (36.6 C)  SpO2: 100%         Filed Weights   08/14/22 0842  Weight: 183 lb 8 oz (83.2 kg)   Physical exam deferred  today in lieu of counseling  LABORATORY DATA:  I have reviewed the data as listed Lab Results  Component Value Date   WBC 4.3 08/14/2022   HGB 10.9 (L) 08/14/2022   HCT 32.9 (L) 08/14/2022   MCV 80.6 08/14/2022   PLT 243 08/14/2022   Lab Results  Component Value Date   NA 140 08/14/2022   K 3.8 08/14/2022   CL 108 08/14/2022   CO2 27 08/14/2022    RADIOGRAPHIC STUDIES: I have personally reviewed the radiological reports and agreed with the findings in the report.  ASSESSMENT AND PLAN:  Tonya Blair is a 46 y.o. female who presents for a follow up for right breast invasive ductal carcinoma.   Malignant neoplasm of upper-outer quadrant of right breast in female, estrogen receptor positive ([place])  #Right breast invasive ductal carcinoma --Biopsy proved on 12/25/2021. Pathology showed invasive ductal carcinoma grade 3, ductal carcinoma in situ and calcifications, 5 cm from the nipple.  Prognostics from this mass showed ER 70% moderate, PR 20% moderate, HER2 positive by IHC, Ki-67 of 30%. --Recommendation is neoadjuvant TCHP q 21 days given T2 tumor with HER2  amplification. -- She completed neoadjuvant chemo, tolerated it very well -- Clinically and radiologically complete response --- She had lumpectomy and sentinel lymph nodes, complete pathologic response.  At this time we will recommend continuing adjuvant Herceptin and pertuzumab for a total duration of 1 year and consider adjuvant antiestrogen therapy after completion of adjuvant radiation --EF showed a slight drop, following with cardiac oncology, Dr Haroldine Laws, ok to continue herceptin and perjeta. -Today we have discussed about tamoxifen, mechanism of action, adverse effects of tamoxifen, including but not limited to postmenopausal symptoms, increased risk of DVT/PE, increased risk of endometrial cancer and endometrial thickening.  A benefit of tamoxifen would be improvement in bone density besides reducing risk of breast cancer recurrence.  She is willing to start antiestrogen therapy, this has been dispensed to the pharmacy of her choice.  She will continue follow-up with [place] every 6 weeks. She will proceed with her mammogram in December 2023.

## 2022-08-15 ENCOUNTER — Other Ambulatory Visit: Payer: Self-pay

## 2022-08-21 ENCOUNTER — Encounter: Payer: Self-pay | Admitting: Hematology and Oncology

## 2022-08-28 ENCOUNTER — Encounter: Payer: Self-pay | Admitting: Hematology and Oncology

## 2022-09-01 ENCOUNTER — Other Ambulatory Visit: Payer: Self-pay | Admitting: Hematology and Oncology

## 2022-09-02 ENCOUNTER — Other Ambulatory Visit: Payer: Self-pay | Admitting: Hematology and Oncology

## 2022-09-02 DIAGNOSIS — Z170 Estrogen receptor positive status [ER+]: Secondary | ICD-10-CM

## 2022-09-03 ENCOUNTER — Encounter: Payer: Self-pay | Admitting: Hematology and Oncology

## 2022-09-03 NOTE — Progress Notes (Signed)
                                                                                                                                                             Patient Name: Tonya Blair MRN: [id number] DOB: Mar 21, 1976 Referring Physician: Donne Hazel MATTHEW (Profile Not Attached) Date of Service: 08/09/2022 [facility], [place]                                                        End Of Treatment Note  Diagnoses: C50.411-Malignant neoplasm of upper-outer quadrant of right female breast  Cancer Staging:  Cancer Staging  Malignant neoplasm of upper-outer quadrant of right breast in female, estrogen receptor positive ([place]) Staging form: Breast, AJCC 8th Edition - Clinical stage from 01/02/2022: Stage IB (cT2, cN0, cM0, G3, ER+, PR+, HER2+) - Signed by Benay Pike, MD on 01/02/2022 Histologic grading system: 3 grade system  ypT0 ypN0  Intent: Curative  Radiation Treatment Dates: 07/18/2022 through 08/09/2022 Site Technique Total Dose (Gy) Dose per Fx (Gy) Completed Fx Beam Energies  Breast, Right: Breast_R 3D 42.56/42.56 2.66 16/16 10X, 6XFFF   Narrative: The patient tolerated radiation therapy relatively well.   Plan: The patient will follow-up with radiation oncology in 32mo . -----------------------------------  Eppie Gibson, MD

## 2022-09-04 ENCOUNTER — Other Ambulatory Visit: Payer: Self-pay

## 2022-09-04 ENCOUNTER — Inpatient Hospital Stay: Payer: 59

## 2022-09-04 ENCOUNTER — Inpatient Hospital Stay: Payer: 59 | Attending: Hematology and Oncology

## 2022-09-04 ENCOUNTER — Telehealth: Payer: Self-pay | Admitting: *Deleted

## 2022-09-04 VITALS — BP 123/71 | HR 83 | Temp 98.5°F | Resp 17 | Wt 181.8 lb

## 2022-09-04 DIAGNOSIS — Z5112 Encounter for antineoplastic immunotherapy: Secondary | ICD-10-CM | POA: Diagnosis present

## 2022-09-04 DIAGNOSIS — Z5111 Encounter for antineoplastic chemotherapy: Secondary | ICD-10-CM | POA: Insufficient documentation

## 2022-09-04 DIAGNOSIS — Z95828 Presence of other vascular implants and grafts: Secondary | ICD-10-CM

## 2022-09-04 DIAGNOSIS — Z79899 Other long term (current) drug therapy: Secondary | ICD-10-CM | POA: Insufficient documentation

## 2022-09-04 DIAGNOSIS — C50411 Malignant neoplasm of upper-outer quadrant of right female breast: Secondary | ICD-10-CM

## 2022-09-04 LAB — CMP (CANCER CENTER ONLY)
ALT: 18 U/L (ref 0–44)
AST: 20 U/L (ref 15–41)
Albumin: 4.3 g/dL (ref 3.5–5.0)
Alkaline Phosphatase: 76 U/L (ref 38–126)
Anion gap: 5 (ref 5–15)
BUN: 17 mg/dL (ref 6–20)
CO2: 28 mmol/L (ref 22–32)
Calcium: 9.9 mg/dL (ref 8.9–10.3)
Chloride: 107 mmol/L (ref 98–111)
Creatinine: 0.89 mg/dL (ref 0.44–1.00)
GFR, Estimated: >60 mL/min (ref >60)
Glucose, Bld: 131 mg/dL — ABNORMAL HIGH (ref 70–99)
Potassium: 3.9 mmol/L (ref 3.5–5.1)
Sodium: 140 mmol/L (ref 135–145)
Total Bilirubin: 0.4 mg/dL (ref 0.3–1.2)
Total Protein: 6.9 g/dL (ref 6.5–8.1)

## 2022-09-04 LAB — CBC WITH DIFFERENTIAL (CANCER CENTER ONLY)
Abs Immature Granulocytes: 0 10*3/uL (ref 0.00–0.07)
Basophils Absolute: 0 10*3/uL (ref 0.0–0.1)
Basophils Relative: 1 %
Eosinophils Absolute: 0.2 10*3/uL (ref 0.0–0.5)
Eosinophils Relative: 5 %
HCT: 34.2 % — ABNORMAL LOW (ref 36.0–46.0)
Hemoglobin: 11.2 g/dL — ABNORMAL LOW (ref 12.0–15.0)
Immature Granulocytes: 0 %
Lymphocytes Relative: 36 %
Lymphs Abs: 1.5 10*3/uL (ref 0.7–4.0)
MCH: 26.2 pg (ref 26.0–34.0)
MCHC: 32.7 g/dL (ref 30.0–36.0)
MCV: 80.1 fL (ref 80.0–100.0)
Monocytes Absolute: 0.4 10*3/uL (ref 0.1–1.0)
Monocytes Relative: 10 %
Neutro Abs: 2 10*3/uL (ref 1.7–7.7)
Neutrophils Relative %: 48 %
Platelet Count: 235 10*3/uL (ref 150–400)
RBC: 4.27 MIL/uL (ref 3.87–5.11)
RDW: 15.6 % — ABNORMAL HIGH (ref 11.5–15.5)
WBC Count: 4.1 10*3/uL (ref 4.0–10.5)
nRBC: 0 % (ref 0.0–0.2)

## 2022-09-04 MED ORDER — ACETAMINOPHEN 325 MG PO TABS
650.0000 mg | ORAL_TABLET | Freq: Once | ORAL | Status: AC
  Administered 2022-09-04: 650 mg via ORAL
  Filled 2022-09-04: qty 2

## 2022-09-04 MED ORDER — SODIUM CHLORIDE 0.9% FLUSH
10.0000 mL | Freq: Once | INTRAVENOUS | Status: AC
  Administered 2022-09-04: 10 mL

## 2022-09-04 MED ORDER — HEPARIN SOD (PORK) LOCK FLUSH 100 UNIT/ML IV SOLN
500.0000 [IU] | Freq: Once | INTRAVENOUS | Status: AC | PRN
  Administered 2022-09-04: 500 [IU]

## 2022-09-04 MED ORDER — SODIUM CHLORIDE 0.9 % IV SOLN
420.0000 mg | Freq: Once | INTRAVENOUS | Status: AC
  Administered 2022-09-04: 420 mg via INTRAVENOUS
  Filled 2022-09-04: qty 14

## 2022-09-04 MED ORDER — TRASTUZUMAB-DKST CHEMO 150 MG IV SOLR
6.0000 mg/kg | Freq: Once | INTRAVENOUS | Status: AC
  Administered 2022-09-04: 504 mg via INTRAVENOUS
  Filled 2022-09-04: qty 24

## 2022-09-04 MED ORDER — DIPHENHYDRAMINE HCL 25 MG PO CAPS: 50.0000 mg | ORAL_CAPSULE | Freq: Once | ORAL | Status: DC

## 2022-09-04 MED ORDER — SODIUM CHLORIDE 0.9% FLUSH
10.0000 mL | INTRAVENOUS | Status: DC | PRN
  Administered 2022-09-04: 10 mL

## 2022-09-04 MED ORDER — SODIUM CHLORIDE 0.9 % IV SOLN: Freq: Once | INTRAVENOUS | Status: AC

## 2022-09-04 NOTE — Progress Notes (Signed)
(  I called the patient today about her upcoming follow-up appointment in radiation oncology.   Given the state of the COVID-19 pandemic, concerning case numbers in our community, and guidance from [facility], I offered a phone assessment with the patient to determine if coming to the clinic was necessary. She accepted.  The patient denies any symptomatic concerns.  Specifically, they report good healing of their skin in the radiation fields.  Skin is intact.    I recommended that she continue skin care by applying oil or lotion with vitamin E to the skin in the radiation fields, BID, for 2 more months.  Continue follow-up with medical oncology - follow-up is scheduled on 11-19-2022 with Wilber Bihari.  I explained that yearly mammograms are important for patients with intact breast tissue, and physical exams are important after mastectomy for patients that cannot undergo mammography.  I encouraged her to call if she had further questions or concerns about her healing. Otherwise, she will follow-up PRN in radiation oncology. Patient is pleased with this plan, and we will cancel her upcoming follow-up to reduce the risk of COVID-19 transmission.  Treatment 07/18/2022-08/09/2022

## 2022-09-04 NOTE — Telephone Encounter (Signed)
Per MD - may remove diphenhydramine from treatment care plan.

## 2022-09-09 NOTE — Progress Notes (Signed)
Rn spoke with Tonya Blair concerning her follow-up appointment with Dr. Isidore Moos on 09/10/22. Patient states that she is doing well and that she does not need to come in . Follow-up was conducted over the telephone.

## 2022-09-10 ENCOUNTER — Ambulatory Visit
Admission: RE | Admit: 2022-09-10 | Discharge: 2022-09-10 | Disposition: A | Discharge status: Home / Self Care | Payer: 59 | Source: Ambulatory Visit | Attending: Radiation Oncology | Admitting: Radiation Oncology

## 2022-09-23 DIAGNOSIS — Z01419 Encounter for gynecological examination (general) (routine) without abnormal findings: Secondary | ICD-10-CM | POA: Diagnosis not present

## 2022-09-23 DIAGNOSIS — Z30432 Encounter for removal of intrauterine contraceptive device: Secondary | ICD-10-CM | POA: Diagnosis not present

## 2022-09-23 DIAGNOSIS — Z8742 Personal history of other diseases of the female genital tract: Secondary | ICD-10-CM | POA: Diagnosis not present

## 2022-09-23 DIAGNOSIS — R69 Illness, unspecified: Secondary | ICD-10-CM | POA: Diagnosis not present

## 2022-09-24 ENCOUNTER — Other Ambulatory Visit: Payer: Self-pay | Admitting: *Deleted

## 2022-09-24 DIAGNOSIS — C50411 Malignant neoplasm of upper-outer quadrant of right female breast: Secondary | ICD-10-CM

## 2022-09-25 ENCOUNTER — Inpatient Hospital Stay: Payer: 59

## 2022-09-25 ENCOUNTER — Inpatient Hospital Stay: Payer: 59 | Attending: Hematology and Oncology | Admitting: Hematology and Oncology

## 2022-09-25 ENCOUNTER — Other Ambulatory Visit: Payer: Self-pay

## 2022-09-25 VITALS — BP 116/61 | HR 86 | Temp 97.8°F | Resp 16 | Ht 62.0 in | Wt 182.0 lb

## 2022-09-25 DIAGNOSIS — Z170 Estrogen receptor positive status [ER+]: Secondary | ICD-10-CM | POA: Diagnosis not present

## 2022-09-25 DIAGNOSIS — C50411 Malignant neoplasm of upper-outer quadrant of right female breast: Secondary | ICD-10-CM

## 2022-09-25 DIAGNOSIS — Z5112 Encounter for antineoplastic immunotherapy: Secondary | ICD-10-CM | POA: Diagnosis not present

## 2022-09-25 DIAGNOSIS — Z95828 Presence of other vascular implants and grafts: Secondary | ICD-10-CM

## 2022-09-25 DIAGNOSIS — Z79899 Other long term (current) drug therapy: Secondary | ICD-10-CM | POA: Diagnosis not present

## 2022-09-25 DIAGNOSIS — Z79810 Long term (current) use of selective estrogen receptor modulators (SERMs): Secondary | ICD-10-CM | POA: Diagnosis not present

## 2022-09-25 LAB — CMP (CANCER CENTER ONLY)
ALT: 22 U/L (ref 0–44)
AST: 17 U/L (ref 15–41)
Albumin: 4.1 g/dL (ref 3.5–5.0)
Alkaline Phosphatase: 76 U/L (ref 38–126)
Anion gap: 3 — ABNORMAL LOW (ref 5–15)
BUN: 19 mg/dL (ref 6–20)
CO2: 29 mmol/L (ref 22–32)
Calcium: 9.2 mg/dL (ref 8.9–10.3)
Chloride: 108 mmol/L (ref 98–111)
Creatinine: 0.97 mg/dL (ref 0.44–1.00)
GFR, Estimated: >60 mL/min (ref >60)
Glucose, Bld: 133 mg/dL — ABNORMAL HIGH (ref 70–99)
Potassium: 4.1 mmol/L (ref 3.5–5.1)
Sodium: 140 mmol/L (ref 135–145)
Total Bilirubin: 0.3 mg/dL (ref 0.3–1.2)
Total Protein: 6.5 g/dL (ref 6.5–8.1)

## 2022-09-25 LAB — CBC WITH DIFFERENTIAL (CANCER CENTER ONLY)
Abs Immature Granulocytes: 0 10*3/uL (ref 0.00–0.07)
Basophils Absolute: 0 10*3/uL (ref 0.0–0.1)
Basophils Relative: 0 %
Eosinophils Absolute: 0.1 10*3/uL (ref 0.0–0.5)
Eosinophils Relative: 3 %
HCT: 34.3 % — ABNORMAL LOW (ref 36.0–46.0)
Hemoglobin: 11.1 g/dL — ABNORMAL LOW (ref 12.0–15.0)
Immature Granulocytes: 0 %
Lymphocytes Relative: 39 %
Lymphs Abs: 1.7 10*3/uL (ref 0.7–4.0)
MCH: 25.8 pg — ABNORMAL LOW (ref 26.0–34.0)
MCHC: 32.4 g/dL (ref 30.0–36.0)
MCV: 79.8 fL — ABNORMAL LOW (ref 80.0–100.0)
Monocytes Absolute: 0.4 10*3/uL (ref 0.1–1.0)
Monocytes Relative: 9 %
Neutro Abs: 2.2 10*3/uL (ref 1.7–7.7)
Neutrophils Relative %: 49 %
Platelet Count: 257 10*3/uL (ref 150–400)
RBC: 4.3 MIL/uL (ref 3.87–5.11)
RDW: 16.3 % — ABNORMAL HIGH (ref 11.5–15.5)
WBC Count: 4.5 10*3/uL (ref 4.0–10.5)
nRBC: 0 % (ref 0.0–0.2)

## 2022-09-25 MED ORDER — HEPARIN SOD (PORK) LOCK FLUSH 100 UNIT/ML IV SOLN
500.0000 [IU] | Freq: Once | INTRAVENOUS | Status: AC | PRN
  Administered 2022-09-25: 500 [IU]

## 2022-09-25 MED ORDER — ACETAMINOPHEN 325 MG PO TABS
650.0000 mg | ORAL_TABLET | Freq: Once | ORAL | Status: AC
  Administered 2022-09-25: 650 mg via ORAL
  Filled 2022-09-25: qty 2

## 2022-09-25 MED ORDER — TRASTUZUMAB-DKST CHEMO 150 MG IV SOLR
6.0000 mg/kg | Freq: Once | INTRAVENOUS | Status: AC
  Administered 2022-09-25: 504 mg via INTRAVENOUS
  Filled 2022-09-25: qty 24

## 2022-09-25 MED ORDER — SODIUM CHLORIDE 0.9 % IV SOLN
420.0000 mg | Freq: Once | INTRAVENOUS | Status: AC
  Administered 2022-09-25: 420 mg via INTRAVENOUS
  Filled 2022-09-25: qty 14

## 2022-09-25 MED ORDER — SODIUM CHLORIDE 0.9 % IV SOLN: Freq: Once | INTRAVENOUS | Status: AC

## 2022-09-25 MED ORDER — SODIUM CHLORIDE 0.9% FLUSH
10.0000 mL | INTRAVENOUS | Status: DC | PRN
  Administered 2022-09-25: 10 mL

## 2022-09-25 MED ORDER — SODIUM CHLORIDE 0.9% FLUSH
10.0000 mL | Freq: Once | INTRAVENOUS | Status: AC
  Administered 2022-09-25: 10 mL

## 2022-09-25 NOTE — Patient Instructions (Signed)
[facility]   Discharge Instructions: Thank you for choosing [facility] to provide your oncology and hematology care.   If you have a lab appointment with the Cancer Center, please go directly to the Cancer Center and check in at the registration area.   Wear comfortable clothing and clothing appropriate for easy access to any Portacath or PICC line.   We strive to give you quality time with your provider. You may need to reschedule your appointment if you arrive late (15 or more minutes).  Arriving late affects you and other patients whose appointments are after yours.  Also, if you miss three or more appointments without notifying the office, you may be dismissed from the clinic at the provider's discretion.      For prescription refill requests, have your pharmacy contact our office and allow 72 hours for refills to be completed.    Today you received the following chemotherapy and/or immunotherapy agents: Trastuzumab (Herceptin) and Pertuzumab (Perjeta)      To help prevent nausea and vomiting after your treatment, we encourage you to take your nausea medication as directed.  BELOW ARE SYMPTOMS THAT SHOULD BE REPORTED IMMEDIATELY: *FEVER GREATER THAN 100.4 F (38 C) OR HIGHER *CHILLS OR SWEATING *NAUSEA AND VOMITING THAT IS NOT CONTROLLED WITH YOUR NAUSEA MEDICATION *UNUSUAL SHORTNESS OF BREATH *UNUSUAL BRUISING OR BLEEDING *URINARY PROBLEMS (pain or burning when urinating, or frequent urination) *BOWEL PROBLEMS (unusual diarrhea, constipation, pain near the anus) TENDERNESS IN MOUTH AND THROAT WITH OR WITHOUT PRESENCE OF ULCERS (sore throat, sores in mouth, or a toothache) UNUSUAL RASH, SWELLING OR PAIN  UNUSUAL VAGINAL DISCHARGE OR ITCHING   Items with * indicate a potential emergency and should be followed up as soon as possible or go to the Emergency Department if any problems should occur.  Please show the CHEMOTHERAPY ALERT CARD or  IMMUNOTHERAPY ALERT CARD at check-in to the Emergency Department and triage nurse.  Should you have questions after your visit or need to cancel or reschedule your appointment, please contact [facility]  Dept: [phone]  and follow the prompts.  Office hours are 8:00 a.m. to 4:30 p.m. Monday - Friday. Please note that voicemails left after 4:00 p.m. may not be returned until the following business day.  We are closed weekends and major holidays. You have access to a nurse at all times for urgent questions. Please call the main number to the clinic Dept: [phone] and follow the prompts.   For any non-urgent questions, you may also contact your provider using MyChart. We now offer e-Visits for anyone 18 and older to request care online for non-urgent symptoms. For details visit [email or web address].   Also download the MyChart app! Go to the app store, search "MyChart", open the app, select [facility], and log in with your MyChart username and password.  Masks are optional in the cancer centers. If you would like for your care team to wear a mask while they are taking care of you, please let them know. You may have one support Milli Woolridge who is at least 48 years old accompany you for your appointments. 

## 2022-09-25 NOTE — Assessment & Plan Note (Signed)
#  Right breast invasive ductal carcinoma  --Biopsy proved on 12/25/2021. Pathology showed invasive ductal carcinoma grade 3, ductal carcinoma in situ and calcifications, 5 cm from the nipple.  Prognostics from this mass showed ER 70% moderate, PR 20% moderate, HER2 positive by IHC, Ki-67 of 30%. --Recommendation is neoadjuvant TCHP q 21 days given T2 tumor with HER2 amplification. -- She completed neoadjuvant chemo, tolerated it very well -- Clinically and radiologically complete response --- She had lumpectomy and sentinel lymph nodes, complete pathologic response.  At this time we will recommend continuing adjuvant Herceptin and pertuzumab for a total duration of 1 year and consider adjuvant antiestrogen therapy after completion of adjuvant radiation --EF showed a slight drop, following with cardiac oncology, Dr Haroldine Laws, ok to continue herceptin and perjeta. She will continue Herceptin and pertuzumab, next echocardiogram due in November.  She also follows up with cardio oncology.  She has been tolerating tamoxifen very well, no adverse effects.  At this time she will continue follow-up every 6 weeks and once she is done with HER2 based therapy, we will stretch her appointments to every 6 months.  No concerning review of systems.  No physical examination findings. She will proceed with her mammogram in December 2023.

## 2022-09-25 NOTE — Progress Notes (Signed)
[place]  PROGRESS NOTE  Patient Care Team: Pcp, No as PCP - General Mauro Kaufmann, RN as Oncology Nurse Navigator Rockwell Germany, RN as Oncology Nurse Navigator Rolm Bookbinder, MD as Consulting Physician (General Surgery) Benay Pike, MD as Consulting Physician (Hematology and Oncology) Eppie Gibson, MD as Attending Physician (Radiation Oncology)  CHIEF COMPLAINTS:  Right breast invasive ductal carcinoma.  Oncology History  Malignant neoplasm of upper-outer quadrant of right breast in female, estrogen receptor positive ([place])  12/31/2021 Initial Diagnosis   Malignant neoplasm of upper-outer quadrant of right breast in female, estrogen receptor positive ([place])   01/02/2022 Cancer Staging   Staging form: Breast, AJCC 8th Edition - Clinical stage from 01/02/2022: Stage IB (cT2, cN0, cM0, G3, ER+, PR+, HER2+) - Signed by Benay Pike, MD on 01/02/2022 Histologic grading system: 3 grade system   01/14/2022 Genetic Testing   Negative hereditary cancer genetic testing: no pathogenic variants detected in [facility] CustomNext-Cancer +RNAinsight Panel.  The report date is January 14, 2022.  The CustomNext-Cancer+RNAinsight panel offered by [facility] includes sequencing and rearrangement analysis for the following 47 genes:  APC, ATM, AXIN2, BARD1, BMPR1A, BRCA1, BRCA2, BRIP1, CDH1, CDK4, CDKN2A, CHEK2, DICER1, EPCAM, GREM1, HOXB13, MEN1, MLH1, MSH2, MSH3, MSH6, MUTYH, NBN, NF1, NF2, NTHL1, PALB2, PMS2, POLD1, POLE, PTEN, RAD51C, RAD51D, RECQL, RET, SDHA, SDHAF2, SDHB, SDHC, SDHD, SMAD4, SMARCA4, STK11, TP53, TSC1, TSC2, and VHL.  RNA data is routinely analyzed for use in variant interpretation for all genes.   01/16/2022 - 08/14/2022 Chemotherapy   Completed chemotherapy May 2023, received 6 cycles of neoadjuvant TCHP Patient is on Treatment Plan : BREAST  Docetaxel + Carboplatin + Trastuzumab + Pertuzumab  (TCHP) q21d       01/16/2022 -  Chemotherapy   Patient is on  Treatment Plan : BREAST  Docetaxel + Carboplatin + Trastuzumab + Pertuzumab  (TCHP) q21d / Trastuzumab + Pertuzumab q21d     06/19/2022 Definitive Surgery   Right breast lumpectomy showed fibrosis with mild inflammation, negative for residual carcinoma, all surgical margins negative for carcinoma, right axillary sentinel lymph node negative for metastatic carcinoma, final pathologic staging [id number]    Interval history   Tonya Blair 46 y.o. female is here after definitive surgery. She had complete pathologic response to neoadjuvant chemotherapy.   She is currently on maintenance with Herceptin and pertuzumab She has one day of diarrhea with pertuzumab, otherwise tolerating it well.  She is now on tamoxifen for antiestrogen therapy and she is tolerating this very well as well she has no side effects from tamoxifen. Most recent echocardiogram from August 21 with ejection fraction of 55 to 60%.  She is due for another echo in November She is anticipating a couple of work-related trips coming up, otherwise Rest of the pertinent 10 point ROS reviewed and negative.  MEDICAL HISTORY:  Past Medical History:  Diagnosis Date   Anemia    IDA   Cancer ([place])    right breast cancer   Family history of prostate cancer 01/02/2022    SURGICAL HISTORY: Past Surgical History:  Procedure Laterality Date   BREAST BIOPSY Right 12/25/2021   BREAST LUMPECTOMY WITH RADIOACTIVE SEED AND SENTINEL LYMPH NODE BIOPSY Right 06/19/2022   Procedure: RADIOACTIVE SEED GUIDED RIGHT BREAST LUMPECTOMY, RIGHT AXILLARY SENTINEL LYMPH NODE BIOPSY;  Surgeon: Rolm Bookbinder, MD;  Location: [facility];  Service: General;  Laterality: Right;   PORTACATH PLACEMENT N/A 01/14/2022   Procedure: INSERTION PORT-A-CATH;  Surgeon: Rolm Bookbinder, MD;  Location: [facility];  Service: General;  Laterality: N/A;   THERAPEUTIC ABORTION     WISDOM TOOTH EXTRACTION      SOCIAL HISTORY: Social History   Socioeconomic  History   Marital status: Single    Spouse name: Not on file   Number of children: Not on file   Years of education: Not on file   Highest education level: Not on file  Occupational History   Not on file  Tobacco Use   Smoking status: Never   Smokeless tobacco: Never  Vaping Use   Vaping Use: Never used  Substance and Sexual Activity   Alcohol use: Yes    Comment: occasional wine   Drug use: Never   Sexual activity: Yes    Birth control/protection: I.U.D.    Comment: Mirena IUD  Other Topics Concern   Not on file  Social History Narrative   Not on file   Social Determinants of Health   Financial Resource Strain: Low Risk  (01/02/2022)   Overall Financial Resource Strain (CARDIA)    Difficulty of Paying Living Expenses: Not hard at all  Food Insecurity: No Food Insecurity (01/02/2022)   Hunger Vital Sign    Worried About Running Out of Food in the Last Year: Never true    Ran Out of Food in the Last Year: Never true  Transportation Needs: No Transportation Needs (01/02/2022)   PRAPARE - Hydrologist (Medical): No    Lack of Transportation (Non-Medical): No  Physical Activity: Not on file  Stress: Not on file  Social Connections: Not on file  Intimate Partner Violence: Not on file    FAMILY HISTORY: Family History  Problem Relation Age of Onset   Prostate cancer Father 28    ALLERGIES:  is allergic to codeine and kiwi extract.  MEDICATIONS:  Current Outpatient Medications  Medication Sig Dispense Refill   Ascorbic Acid (VITAMIN C PO) Take 1 tablet by mouth in the morning.     BIOTIN PO Take 1 tablet by mouth in the morning.     Cholecalciferol (VITAMIN D3 PO) Take 1 tablet by mouth in the morning.     COD LIVER OIL PO Take by mouth.     ibuprofen (ADVIL) 200 MG tablet Take 200 mg by mouth every 8 (eight) hours as needed (pain.).     lidocaine-prilocaine (EMLA) cream Apply to affected area once 30 g 3   losartan (COZAAR) 25 MG tablet  Take 0.5 tablets (12.5 mg total) by mouth at bedtime. 15 tablet 3   metoprolol succinate (TOPROL XL) 25 MG 24 hr tablet Take 0.5 tablets (12.5 mg total) by mouth at bedtime. 15 tablet 3   Multiple Minerals-Vitamins (CAL MAG ZINC +D3 PO) Take 1 tablet by mouth in the morning.     OVER THE COUNTER MEDICATION Take 1 capsule by mouth in the morning. Sea Moss Superfood Capsule     oxyCODONE (OXY IR/ROXICODONE) 5 MG immediate release tablet Take 1 tablet (5 mg total) by mouth every 6 (six) hours as needed. 10 tablet 0   tamoxifen (NOLVADEX) 20 MG tablet Take 1 tablet (20 mg total) by mouth daily. 90 tablet 3   traMADol (ULTRAM) 50 MG tablet Take 2 tablets (100 mg total) by mouth every 6 (six) hours as needed. 10 tablet 0   Turmeric (QC TUMERIC COMPLEX PO) Take by mouth.     No current facility-administered medications for this visit.    PHYSICAL EXAMINATION: ECOG PERFORMANCE STATUS:  1 - Symptomatic but completely ambulatory  Vitals:   09/25/22 0927  BP: 116/61  Pulse: 86  Resp: 16  Temp: 97.8 F (36.6 C)  SpO2: 99%    Filed Weights   09/25/22 0927  Weight: 182 lb (82.6 kg)   Physical Exam Constitutional:      Appearance: Normal appearance.  Chest:     Comments: Bilateral breasts inspected and palpated.  No regional adenopathy Musculoskeletal:        General: No swelling.     Cervical back: Normal range of motion and neck supple. No rigidity.  Lymphadenopathy:     Cervical: No cervical adenopathy.  Neurological:     Mental Status: She is alert.      LABORATORY DATA:  I have reviewed the data as listed Lab Results  Component Value Date   WBC 4.5 09/25/2022   HGB 11.1 (L) 09/25/2022   HCT 34.3 (L) 09/25/2022   MCV 79.8 (L) 09/25/2022   PLT 257 09/25/2022   Lab Results  Component Value Date   NA 140 09/04/2022   K 3.9 09/04/2022   CL 107 09/04/2022   CO2 28 09/04/2022    RADIOGRAPHIC STUDIES: I have personally reviewed the radiological reports and agreed with the  findings in the report.  ASSESSMENT AND PLAN:  Tonya Blair is a 46 y.o. female who presents for a follow up for right breast invasive ductal carcinoma.   Malignant neoplasm of upper-outer quadrant of right breast in female, estrogen receptor positive ([place])  #Right breast invasive ductal carcinoma  --Biopsy proved on 12/25/2021. Pathology showed invasive ductal carcinoma grade 3, ductal carcinoma in situ and calcifications, 5 cm from the nipple.  Prognostics from this mass showed ER 70% moderate, PR 20% moderate, HER2 positive by IHC, Ki-67 of 30%. --Recommendation is neoadjuvant TCHP q 21 days given T2 tumor with HER2 amplification. -- She completed neoadjuvant chemo, tolerated it very well -- Clinically and radiologically complete response --- She had lumpectomy and sentinel lymph nodes, complete pathologic response.  At this time we will recommend continuing adjuvant Herceptin and pertuzumab for a total duration of 1 year and consider adjuvant antiestrogen therapy after completion of adjuvant radiation --EF showed a slight drop, following with cardiac oncology, Dr Haroldine Laws, ok to continue herceptin and perjeta. She will continue Herceptin and pertuzumab, next echocardiogram due in November.  She also follows up with cardio oncology.  She has been tolerating tamoxifen very well, no adverse effects.  At this time she will continue follow-up every 6 weeks and once she is done with HER2 based therapy, we will stretch her appointments to every 6 months.  No concerning review of systems.  No physical examination findings. She will proceed with her mammogram in December 2023.  Total time spent: 20 minutes including history, physical, review of records, counseling and coordination of care

## 2022-09-30 ENCOUNTER — Ambulatory Visit: Payer: 59 | Attending: General Surgery

## 2022-09-30 VITALS — Wt 183.0 lb

## 2022-09-30 DIAGNOSIS — Z483 Aftercare following surgery for neoplasm: Secondary | ICD-10-CM | POA: Insufficient documentation

## 2022-09-30 NOTE — Therapy (Signed)
  OUTPATIENT PHYSICAL THERAPY SOZO SCREENING NOTE   Patient Name: Tonya Blair MRN: [id number] DOB:18-Oct-1976, 46 y.o., female Today's Date: 09/30/2022  PCP: Merryl Hacker, No REFERRING PROVIDER: Rolm Bookbinder, MD   PT End of Session - 09/30/22 [phone]     Visit Number 2   # unchange due to screen only   PT Start Time 0806    PT Stop Time 0814    PT Time Calculation (min) 8 min    Activity Tolerance Patient tolerated treatment well    Behavior During Therapy [facility] for tasks assessed/performed             Past Medical History:  Diagnosis Date   Anemia    IDA   Cancer ([place])    right breast cancer   Family history of prostate cancer 01/02/2022   Past Surgical History:  Procedure Laterality Date   BREAST BIOPSY Right 12/25/2021   BREAST LUMPECTOMY WITH RADIOACTIVE SEED AND SENTINEL LYMPH NODE BIOPSY Right 06/19/2022   Procedure: RADIOACTIVE SEED GUIDED RIGHT BREAST LUMPECTOMY, RIGHT AXILLARY SENTINEL LYMPH NODE BIOPSY;  Surgeon: Rolm Bookbinder, MD;  Location: [place];  Service: General;  Laterality: Right;   PORTACATH PLACEMENT N/A 01/14/2022   Procedure: INSERTION PORT-A-CATH;  Surgeon: Rolm Bookbinder, MD;  Location: [place];  Service: General;  Laterality: N/A;   THERAPEUTIC ABORTION     WISDOM TOOTH EXTRACTION     Patient Active Problem List   Diagnosis Date Noted   Port-A-Cath in place 04/10/2022   Anemia due to antineoplastic chemotherapy 04/10/2022   Chemotherapy induced diarrhea 03/19/2022   Genetic testing 01/18/2022   Family history of prostate cancer 01/02/2022   Malignant neoplasm of upper-outer quadrant of right breast in female, estrogen receptor positive ([place]) 12/31/2021   Cervical intraepithelial neoplasia grade 2 10/28/2019    REFERRING DIAG: right breast cancer at risk for lymphedema  THERAPY DIAG:  Aftercare following surgery for neoplasm  PERTINENT HISTORY: Completed neoadjuvant chemotherapy TCHP May 2023. Rt lumpectomy 06/19/22  showing no signs of carcinoma. 4 negative lymph node. Will be having radiation Thursday.   PRECAUTIONS: right UE Lymphedema risk, None  SUBJECTIVE: Pt returns for her first 3 month L-Dex screen.   PAIN:  Are you having pain? No  SOZO SCREENING: Patient was assessed today using the SOZO machine to determine the lymphedema index score. This was compared to her baseline score. It was determined that she is within the recommended range when compared to her baseline and no further action is needed at this time. She will continue SOZO screenings. These are done every 3 months for 2 years post operatively followed by every 6 months for 2 years, and then annually.   L-DEX FLOWSHEETS - 09/30/22 0800       L-DEX LYMPHEDEMA SCREENING   Measurement Type Unilateral    L-DEX MEASUREMENT EXTREMITY Upper Extremity    POSITION  Standing    DOMINANT SIDE Right    At Risk Side Right    BASELINE SCORE (UNILATERAL) 1.7    L-DEX SCORE (UNILATERAL) 2    VALUE CHANGE (UNILAT) 0.3               Otelia Limes, PTA 09/30/2022, 8:15 AM

## 2022-10-01 ENCOUNTER — Other Ambulatory Visit: Payer: Self-pay

## 2022-10-16 ENCOUNTER — Other Ambulatory Visit: Payer: Self-pay | Admitting: *Deleted

## 2022-10-16 ENCOUNTER — Inpatient Hospital Stay: Payer: 59

## 2022-10-16 ENCOUNTER — Other Ambulatory Visit: Payer: Self-pay

## 2022-10-16 ENCOUNTER — Encounter: Payer: Self-pay | Admitting: *Deleted

## 2022-10-16 VITALS — BP 123/62 | HR 88 | Temp 98.6°F | Resp 18 | Wt 184.8 lb

## 2022-10-16 DIAGNOSIS — Z170 Estrogen receptor positive status [ER+]: Secondary | ICD-10-CM

## 2022-10-16 DIAGNOSIS — Z95828 Presence of other vascular implants and grafts: Secondary | ICD-10-CM

## 2022-10-16 DIAGNOSIS — Z79899 Other long term (current) drug therapy: Secondary | ICD-10-CM | POA: Diagnosis not present

## 2022-10-16 DIAGNOSIS — C50411 Malignant neoplasm of upper-outer quadrant of right female breast: Secondary | ICD-10-CM

## 2022-10-16 DIAGNOSIS — Z79810 Long term (current) use of selective estrogen receptor modulators (SERMs): Secondary | ICD-10-CM | POA: Diagnosis not present

## 2022-10-16 DIAGNOSIS — Z5112 Encounter for antineoplastic immunotherapy: Secondary | ICD-10-CM | POA: Diagnosis not present

## 2022-10-16 LAB — CBC WITH DIFFERENTIAL (CANCER CENTER ONLY)
Abs Immature Granulocytes: 0 10*3/uL (ref 0.00–0.07)
Basophils Absolute: 0 10*3/uL (ref 0.0–0.1)
Basophils Relative: 0 %
Eosinophils Absolute: 0.2 10*3/uL (ref 0.0–0.5)
Eosinophils Relative: 4 %
HCT: 33.4 % — ABNORMAL LOW (ref 36.0–46.0)
Hemoglobin: 10.8 g/dL — ABNORMAL LOW (ref 12.0–15.0)
Immature Granulocytes: 0 %
Lymphocytes Relative: 32 %
Lymphs Abs: 1.5 10*3/uL (ref 0.7–4.0)
MCH: 25.4 pg — ABNORMAL LOW (ref 26.0–34.0)
MCHC: 32.3 g/dL (ref 30.0–36.0)
MCV: 78.6 fL — ABNORMAL LOW (ref 80.0–100.0)
Monocytes Absolute: 0.4 10*3/uL (ref 0.1–1.0)
Monocytes Relative: 8 %
Neutro Abs: 2.6 10*3/uL (ref 1.7–7.7)
Neutrophils Relative %: 56 %
Platelet Count: 266 10*3/uL (ref 150–400)
RBC: 4.25 MIL/uL (ref 3.87–5.11)
RDW: 16.9 % — ABNORMAL HIGH (ref 11.5–15.5)
WBC Count: 4.7 10*3/uL (ref 4.0–10.5)
nRBC: 0 % (ref 0.0–0.2)

## 2022-10-16 LAB — CMP (CANCER CENTER ONLY)
ALT: 19 U/L (ref 0–44)
AST: 17 U/L (ref 15–41)
Albumin: 4 g/dL (ref 3.5–5.0)
Alkaline Phosphatase: 80 U/L (ref 38–126)
Anion gap: 5 (ref 5–15)
BUN: 18 mg/dL (ref 6–20)
CO2: 28 mmol/L (ref 22–32)
Calcium: 9.4 mg/dL (ref 8.9–10.3)
Chloride: 108 mmol/L (ref 98–111)
Creatinine: 0.88 mg/dL (ref 0.44–1.00)
GFR, Estimated: >60 mL/min (ref >60)
Glucose, Bld: 124 mg/dL — ABNORMAL HIGH (ref 70–99)
Potassium: 3.9 mmol/L (ref 3.5–5.1)
Sodium: 141 mmol/L (ref 135–145)
Total Bilirubin: 0.3 mg/dL (ref 0.3–1.2)
Total Protein: 6.8 g/dL (ref 6.5–8.1)

## 2022-10-16 MED ORDER — ACETAMINOPHEN 325 MG PO TABS
650.0000 mg | ORAL_TABLET | Freq: Once | ORAL | Status: AC
  Administered 2022-10-16: 650 mg via ORAL
  Filled 2022-10-16: qty 2

## 2022-10-16 MED ORDER — HEPARIN SOD (PORK) LOCK FLUSH 100 UNIT/ML IV SOLN
500.0000 [IU] | Freq: Once | INTRAVENOUS | Status: AC | PRN
  Administered 2022-10-16: 500 [IU]

## 2022-10-16 MED ORDER — SODIUM CHLORIDE 0.9 % IV SOLN: Freq: Once | INTRAVENOUS | Status: AC

## 2022-10-16 MED ORDER — SODIUM CHLORIDE 0.9 % IV SOLN
420.0000 mg | Freq: Once | INTRAVENOUS | Status: AC
  Administered 2022-10-16: 420 mg via INTRAVENOUS
  Filled 2022-10-16: qty 14

## 2022-10-16 MED ORDER — TRASTUZUMAB-DKST CHEMO 150 MG IV SOLR
6.0000 mg/kg | Freq: Once | INTRAVENOUS | Status: AC
  Administered 2022-10-16: 504 mg via INTRAVENOUS
  Filled 2022-10-16: qty 24

## 2022-10-16 MED ORDER — SODIUM CHLORIDE 0.9% FLUSH
10.0000 mL | Freq: Once | INTRAVENOUS | Status: AC
  Administered 2022-10-16: 10 mL

## 2022-10-16 MED ORDER — SODIUM CHLORIDE 0.9% FLUSH
10.0000 mL | INTRAVENOUS | Status: DC | PRN
  Administered 2022-10-16: 10 mL

## 2022-10-16 NOTE — Progress Notes (Signed)
Pt declined 30 minute observation period post Pertuzumab infusion. VSS. No complaints at time of discharge.

## 2022-10-16 NOTE — Patient Instructions (Signed)
[facility]ICAL [facility]COL[facility]  Discharge Instructions: Thank you for choosing [facility] to provide your oncology and hematology care.   If you have a lab appointment with the Cancer Center, please go directly to the Cancer Center and check in at the registration area.   Wear comfortable clothing and clothing appropriate for easy access to any Portacath or PICC line.   We strive to give you quality time with your provider. You may need to reschedule your appointment if you arrive late (15 or more minutes).  Arriving late affects you and other patients whose appointments are after yours.  Also, if you miss three or more appointments without notifying the office, you may be dismissed from the clinic at the provider's discretion.      For prescription refill requests, have your pharmacy contact our office and allow 72 hours for refills to be completed.    Today you received the following chemotherapy and/or immunotherapy agents: Trastuzumab, Pertuzumab.       To help prevent nausea and vomiting after your treatment, we encourage you to take your nausea medication as directed.  BELOW ARE SYMPTOMS THAT SHOULD BE REPORTED IMMEDIATELY: *FEVER GREATER THAN 100.4 F (38 C) OR HIGHER *CHILLS OR SWEATING *NAUSEA AND VOMITING THAT IS NOT CONTROLLED WITH YOUR NAUSEA MEDICATION *UNUSUAL SHORTNESS OF BREATH *UNUSUAL BRUISING OR BLEEDING *URINARY PROBLEMS (pain or burning when urinating, or frequent urination) *BOWEL PROBLEMS (unusual diarrhea, constipation, pain near the anus) TENDERNESS IN MOUTH AND THROAT WITH OR WITHOUT PRESENCE OF ULCERS (sore throat, sores in mouth, or a toothache) UNUSUAL RASH, SWELLING OR PAIN  UNUSUAL VAGINAL DISCHARGE OR ITCHING   Items with * indicate a potential emergency and should be followed up as soon as possible or go to the Emergency Department if any problems should occur.  Please show the CHEMOTHERAPY ALERT CARD or IMMUNOTHERAPY ALERT CARD  at check-in to the Emergency Department and triage nurse.  Should you have questions after your visit or need to cancel or reschedule your appointment, please contact [facility]  Dept: [phone]  and follow the prompts.  Office hours are 8:00 a.m. to 4:30 p.m. Monday - Friday. Please note that voicemails left after 4:00 p.m. may not be returned until the following business day.  We are closed weekends and major holidays. You have access to a nurse at all times for urgent questions. Please call the main number to the clinic Dept: [phone] and follow the prompts.   For any non-urgent questions, you may also contact your provider using MyChart. We now offer e-Visits for anyone 18 and older to request care online for non-urgent symptoms. For details visit [email or web address].   Also download the MyChart app! Go to the app store, search "MyChart", open the app, select [facility], and log in with your MyChart username and password.  Masks are optional in the cancer centers. If you would like for your care team to wear a mask while they are taking care of you, please let them know. You may have one support person who is at least 48 years old accompany you for your appointments. 

## 2022-10-17 DIAGNOSIS — Z3202 Encounter for pregnancy test, result negative: Secondary | ICD-10-CM | POA: Diagnosis not present

## 2022-10-17 DIAGNOSIS — Z30430 Encounter for insertion of intrauterine contraceptive device: Secondary | ICD-10-CM | POA: Diagnosis not present

## 2022-10-21 ENCOUNTER — Encounter: Payer: Self-pay | Admitting: Hematology and Oncology

## 2022-10-23 ENCOUNTER — Telehealth: Payer: Self-pay

## 2022-10-23 NOTE — Patient Outreach (Signed)
  Care Coordination   10/23/2022 Name: Tonya Blair MRN: [id number] DOB: [birth year]   Care Coordination Outreach Attempts:  An unsuccessful telephone outreach was attempted today to offer the patient information about available care coordination services as a benefit of their health plan.   Follow Up Plan:  Additional outreach attempts will be made to offer the patient care coordination information and services.   Encounter Outcome:  No Answer  Care Coordination Interventions Activated:  No   Care Coordination Interventions:  No, not indicated    Peter Garter RN, BSN,CCM, [facility] [phone]

## 2022-10-30 ENCOUNTER — Encounter: Payer: Self-pay | Admitting: Hematology and Oncology

## 2022-11-06 ENCOUNTER — Other Ambulatory Visit: Payer: Self-pay

## 2022-11-06 ENCOUNTER — Inpatient Hospital Stay: Payer: 59

## 2022-11-06 ENCOUNTER — Inpatient Hospital Stay: Payer: 59 | Attending: Hematology and Oncology | Admitting: Hematology and Oncology

## 2022-11-06 VITALS — BP 113/60 | HR 83 | Temp 98.1°F | Resp 18

## 2022-11-06 DIAGNOSIS — Z95828 Presence of other vascular implants and grafts: Secondary | ICD-10-CM

## 2022-11-06 DIAGNOSIS — Z170 Estrogen receptor positive status [ER+]: Secondary | ICD-10-CM

## 2022-11-06 DIAGNOSIS — C50411 Malignant neoplasm of upper-outer quadrant of right female breast: Secondary | ICD-10-CM | POA: Insufficient documentation

## 2022-11-06 DIAGNOSIS — Z79899 Other long term (current) drug therapy: Secondary | ICD-10-CM | POA: Insufficient documentation

## 2022-11-06 DIAGNOSIS — Z5112 Encounter for antineoplastic immunotherapy: Secondary | ICD-10-CM | POA: Insufficient documentation

## 2022-11-06 LAB — CBC WITH DIFFERENTIAL (CANCER CENTER ONLY)
Abs Immature Granulocytes: 0.01 10*3/uL (ref 0.00–0.07)
Basophils Absolute: 0 10*3/uL (ref 0.0–0.1)
Basophils Relative: 0 %
Eosinophils Absolute: 0.2 10*3/uL (ref 0.0–0.5)
Eosinophils Relative: 4 %
HCT: 33.8 % — ABNORMAL LOW (ref 36.0–46.0)
Hemoglobin: 11 g/dL — ABNORMAL LOW (ref 12.0–15.0)
Immature Granulocytes: 0 %
Lymphocytes Relative: 35 %
Lymphs Abs: 1.7 10*3/uL (ref 0.7–4.0)
MCH: 26.3 pg (ref 26.0–34.0)
MCHC: 32.5 g/dL (ref 30.0–36.0)
MCV: 80.7 fL (ref 80.0–100.0)
Monocytes Absolute: 0.4 10*3/uL (ref 0.1–1.0)
Monocytes Relative: 7 %
Neutro Abs: 2.6 10*3/uL (ref 1.7–7.7)
Neutrophils Relative %: 54 %
Platelet Count: 237 10*3/uL (ref 150–400)
RBC: 4.19 MIL/uL (ref 3.87–5.11)
RDW: 16.3 % — ABNORMAL HIGH (ref 11.5–15.5)
WBC Count: 4.9 10*3/uL (ref 4.0–10.5)
nRBC: 0 % (ref 0.0–0.2)

## 2022-11-06 LAB — CMP (CANCER CENTER ONLY)
ALT: 18 U/L (ref 0–44)
AST: 17 U/L (ref 15–41)
Albumin: 4.2 g/dL (ref 3.5–5.0)
Alkaline Phosphatase: 83 U/L (ref 38–126)
Anion gap: 5 (ref 5–15)
BUN: 19 mg/dL (ref 6–20)
CO2: 29 mmol/L (ref 22–32)
Calcium: 9.5 mg/dL (ref 8.9–10.3)
Chloride: 105 mmol/L (ref 98–111)
Creatinine: 0.99 mg/dL (ref 0.44–1.00)
GFR, Estimated: >60 mL/min (ref >60)
Glucose, Bld: 178 mg/dL — ABNORMAL HIGH (ref 70–99)
Potassium: 3.6 mmol/L (ref 3.5–5.1)
Sodium: 139 mmol/L (ref 135–145)
Total Bilirubin: 0.4 mg/dL (ref 0.3–1.2)
Total Protein: 7.1 g/dL (ref 6.5–8.1)

## 2022-11-06 MED ORDER — SODIUM CHLORIDE 0.9 % IV SOLN
420.0000 mg | Freq: Once | INTRAVENOUS | Status: AC
  Administered 2022-11-06: 420 mg via INTRAVENOUS
  Filled 2022-11-06: qty 14

## 2022-11-06 MED ORDER — ACETAMINOPHEN 325 MG PO TABS
650.0000 mg | ORAL_TABLET | Freq: Once | ORAL | Status: AC
  Administered 2022-11-06: 650 mg via ORAL
  Filled 2022-11-06: qty 2

## 2022-11-06 MED ORDER — TRASTUZUMAB-DKST CHEMO 150 MG IV SOLR
6.0000 mg/kg | Freq: Once | INTRAVENOUS | Status: AC
  Administered 2022-11-06: 504 mg via INTRAVENOUS
  Filled 2022-11-06: qty 24

## 2022-11-06 MED ORDER — SODIUM CHLORIDE 0.9 % IV SOLN: Freq: Once | INTRAVENOUS | Status: AC

## 2022-11-06 MED ORDER — HEPARIN SOD (PORK) LOCK FLUSH 100 UNIT/ML IV SOLN
500.0000 [IU] | Freq: Once | INTRAVENOUS | Status: AC | PRN
  Administered 2022-11-06: 500 [IU]

## 2022-11-06 MED ORDER — SODIUM CHLORIDE 0.9% FLUSH
10.0000 mL | INTRAVENOUS | Status: DC | PRN
  Administered 2022-11-06: 10 mL

## 2022-11-06 MED ORDER — SODIUM CHLORIDE 0.9% FLUSH
10.0000 mL | Freq: Once | INTRAVENOUS | Status: AC
  Administered 2022-11-06: 10 mL

## 2022-11-06 NOTE — Assessment & Plan Note (Signed)
#  Right breast invasive ductal carcinoma  --Biopsy proved on 12/25/2021. Pathology showed invasive ductal carcinoma grade 3, ductal carcinoma in situ and calcifications, 5 cm from the nipple.  Prognostics from this mass showed ER 70% moderate, PR 20% moderate, HER2 positive by IHC, Ki-67 of 30%. --Recommendation is neoadjuvant TCHP q 21 days given T2 tumor with HER2 amplification. -- She completed neoadjuvant chemo, tolerated it very well -- Clinically and radiologically complete response --- She had lumpectomy and sentinel lymph nodes, complete pathologic response.  At this time we will recommend continuing adjuvant Herceptin and pertuzumab for a total duration of 1 year and consider adjuvant antiestrogen therapy after completion of adjuvant radiation --EF showed a slight drop, following with cardiac oncology, Dr Haroldine Laws, ok to continue herceptin and perjeta. Last echo from August 2023 with satisfactory EF, next 1 scheduled in November.  At this time there are no clinical concerns for toxicity.  She is tolerating the combination of Herceptin and pertuzumab very well except for mild diarrhea.  With tamoxifen she has noted pretty severe hot flashes but is reluctant to try anything for hot flashes.  She understands that there is no good data for low-dose tamoxifen and invasive breast cancer hence she will continue 20 mg daily.  She will return to clinic in 6 weeks for follow-up or sooner as needed.  Infusion and labs every 3 weeks.    I change the mammogram to February 2024

## 2022-11-06 NOTE — Progress Notes (Signed)
Tonya Blair  PROGRESS NOTE  Patient Care Team: Pcp, No as PCP - General Mauro Kaufmann, RN as Oncology Nurse Navigator Rockwell Germany, RN as Oncology Nurse Navigator Rolm Bookbinder, MD as Consulting Physician (General Surgery) Benay Pike, MD as Consulting Physician (Hematology and Oncology) Eppie Gibson, MD as Attending Physician (Radiation Oncology)  CHIEF COMPLAINTS:  Right breast invasive ductal carcinoma.  Oncology History  Malignant neoplasm of upper-outer quadrant of right breast in female, estrogen receptor positive ([place])  12/31/2021 Initial Diagnosis   Malignant neoplasm of upper-outer quadrant of right breast in female, estrogen receptor positive ([place])   01/02/2022 Cancer Staging   Staging form: Breast, AJCC 8th Edition - Clinical stage from 01/02/2022: Stage IB (cT2, cN0, cM0, G3, ER+, PR+, HER2+) - Signed by Benay Pike, MD on 01/02/2022 Histologic grading system: 3 grade system   01/14/2022 Genetic Testing   Negative hereditary cancer genetic testing: no pathogenic variants detected in [facility] CustomNext-Cancer +RNAinsight Panel.  The report date is January 14, 2022.  The CustomNext-Cancer+RNAinsight panel offered by [facility] includes sequencing and rearrangement analysis for the following 47 genes:  APC, ATM, AXIN2, BARD1, BMPR1A, BRCA1, BRCA2, BRIP1, CDH1, CDK4, CDKN2A, CHEK2, DICER1, EPCAM, GREM1, HOXB13, MEN1, MLH1, MSH2, MSH3, MSH6, MUTYH, NBN, NF1, NF2, NTHL1, PALB2, PMS2, POLD1, POLE, PTEN, RAD51C, RAD51D, RECQL, RET, SDHA, SDHAF2, SDHB, SDHC, SDHD, SMAD4, SMARCA4, STK11, TP53, TSC1, TSC2, and VHL.  RNA data is routinely analyzed for use in variant interpretation for all genes.   01/16/2022 - 08/14/2022 Chemotherapy   Completed chemotherapy May 2023, received 6 cycles of neoadjuvant TCHP Patient is on Treatment Plan : BREAST  Docetaxel + Carboplatin + Trastuzumab + Pertuzumab  (TCHP) q21d       01/16/2022 -  Chemotherapy   Patient is on  Treatment Plan : BREAST  Docetaxel + Carboplatin + Trastuzumab + Pertuzumab  (TCHP) q21d / Trastuzumab + Pertuzumab q21d     06/19/2022 Definitive Surgery   Right breast lumpectomy showed fibrosis with mild inflammation, negative for residual carcinoma, all surgical margins negative for carcinoma, right axillary sentinel lymph node negative for metastatic carcinoma, final pathologic staging [id number]    Interval history   Tonya Blair 46 y.o. female is here after definitive surgery. She had complete pathologic response to neoadjuvant chemotherapy.   She is currently on maintenance with Herceptin and pertuzumab Last ECHO Aug 23, EF of 55-60% She has one day of semi formed stool after infusion, otherwise no problems. She complains of pretty bad hot flashes from tamoxifen but is reluctant to try any medication. Rest of the pertinent 10 point ROS reviewed and negative.  MEDICAL HISTORY:  Past Medical History:  Diagnosis Date   Anemia    IDA   Cancer ([place])    right breast cancer   Family history of prostate cancer 01/02/2022    SURGICAL HISTORY: Past Surgical History:  Procedure Laterality Date   BREAST BIOPSY Right 12/25/2021   BREAST LUMPECTOMY WITH RADIOACTIVE SEED AND SENTINEL LYMPH NODE BIOPSY Right 06/19/2022   Procedure: RADIOACTIVE SEED GUIDED RIGHT BREAST LUMPECTOMY, RIGHT AXILLARY SENTINEL LYMPH NODE BIOPSY;  Surgeon: Rolm Bookbinder, MD;  Location: [facility];  Service: General;  Laterality: Right;   PORTACATH PLACEMENT N/A 01/14/2022   Procedure: INSERTION PORT-A-CATH;  Surgeon: Rolm Bookbinder, MD;  Location: [facility];  Service: General;  Laterality: N/A;   THERAPEUTIC ABORTION     WISDOM TOOTH EXTRACTION      SOCIAL HISTORY: Social History   Socioeconomic History  Marital status: Single    Spouse name: Not on file   Number of children: Not on file   Years of education: Not on file   Highest education level: Not on file  Occupational History    Not on file  Tobacco Use   Smoking status: Never   Smokeless tobacco: Never  Vaping Use   Vaping Use: Never used  Substance and Sexual Activity   Alcohol use: Yes    Comment: occasional wine   Drug use: Never   Sexual activity: Yes    Birth control/protection: I.U.D.    Comment: Mirena IUD  Other Topics Concern   Not on file  Social History Narrative   Not on file   Social Determinants of Health   Financial Resource Strain: Low Risk  (01/02/2022)   Overall Financial Resource Strain (CARDIA)    Difficulty of Paying Living Expenses: Not hard at all  Food Insecurity: No Food Insecurity (01/02/2022)   Hunger Vital Sign    Worried About Running Out of Food in the Last Year: Never true    Ran Out of Food in the Last Year: Never true  Transportation Needs: No Transportation Needs (01/02/2022)   PRAPARE - Hydrologist (Medical): No    Lack of Transportation (Non-Medical): No  Physical Activity: Not on file  Stress: Not on file  Social Connections: Not on file  Intimate Partner Violence: Not on file    FAMILY HISTORY: Family History  Problem Relation Age of Onset   Prostate cancer Father 61    ALLERGIES:  is allergic to codeine and kiwi extract.  MEDICATIONS:  Current Outpatient Medications  Medication Sig Dispense Refill   paragard intrauterine copper IUD IUD 1 each by Intrauterine route once.     Ascorbic Acid (VITAMIN C PO) Take 1 tablet by mouth in the morning.     BIOTIN PO Take 1 tablet by mouth in the morning.     Cholecalciferol (VITAMIN D3 PO) Take 1 tablet by mouth in the morning.     COD LIVER OIL PO Take by mouth.     ibuprofen (ADVIL) 200 MG tablet Take 200 mg by mouth every 8 (eight) hours as needed (pain.).     lidocaine-prilocaine (EMLA) cream Apply to affected area once 30 g 3   losartan (COZAAR) 25 MG tablet Take 0.5 tablets (12.5 mg total) by mouth at bedtime. 15 tablet 3   metoprolol succinate (TOPROL XL) 25 MG 24 hr tablet  Take 0.5 tablets (12.5 mg total) by mouth at bedtime. 15 tablet 3   Multiple Minerals-Vitamins (CAL MAG ZINC +D3 PO) Take 1 tablet by mouth in the morning.     OVER THE COUNTER MEDICATION Take 1 capsule by mouth in the morning. Sea Moss Superfood Capsule     oxyCODONE (OXY IR/ROXICODONE) 5 MG immediate release tablet Take 1 tablet (5 mg total) by mouth every 6 (six) hours as needed. 10 tablet 0   tamoxifen (NOLVADEX) 20 MG tablet Take 1 tablet (20 mg total) by mouth daily. 90 tablet 3   traMADol (ULTRAM) 50 MG tablet Take 2 tablets (100 mg total) by mouth every 6 (six) hours as needed. 10 tablet 0   Turmeric (QC TUMERIC COMPLEX PO) Take by mouth.     No current facility-administered medications for this visit.    PHYSICAL EXAMINATION: ECOG PERFORMANCE STATUS: 1 - Symptomatic but completely ambulatory  Vitals:   11/06/22 0944  BP: 117/70  Pulse: 94  Resp:  16  Temp: 97.9 F (36.6 C)  SpO2: 100%    Filed Weights   11/06/22 0944  Weight: 183 lb 14.4 oz (83.4 kg)   Physical Exam Constitutional:      Appearance: Normal appearance.  Chest:     Comments: Bilateral breasts inspected and palpated.  No regional adenopathy Musculoskeletal:        General: No swelling.     Cervical back: Normal range of motion and neck supple. No rigidity.  Lymphadenopathy:     Cervical: No cervical adenopathy.  Neurological:     Mental Status: She is alert.      LABORATORY DATA:  I have reviewed the data as listed Lab Results  Component Value Date   WBC 4.9 11/06/2022   HGB 11.0 (L) 11/06/2022   HCT 33.8 (L) 11/06/2022   MCV 80.7 11/06/2022   PLT 237 11/06/2022   Lab Results  Component Value Date   NA 141 10/16/2022   K 3.9 10/16/2022   CL 108 10/16/2022   CO2 28 10/16/2022    RADIOGRAPHIC STUDIES: I have personally reviewed the radiological reports and agreed with the findings in the report.  ASSESSMENT AND PLAN:  Tonya Blair is a 46 y.o. female who presents for a follow  up for right breast invasive ductal carcinoma.   Malignant neoplasm of upper-outer quadrant of right breast in female, estrogen receptor positive ([place])  #Right breast invasive ductal carcinoma  --Biopsy proved on 12/25/2021. Pathology showed invasive ductal carcinoma grade 3, ductal carcinoma in situ and calcifications, 5 cm from the nipple.  Prognostics from this mass showed ER 70% moderate, PR 20% moderate, HER2 positive by IHC, Ki-67 of 30%. --Recommendation is neoadjuvant TCHP q 21 days given T2 tumor with HER2 amplification. -- She completed neoadjuvant chemo, tolerated it very well -- Clinically and radiologically complete response --- She had lumpectomy and sentinel lymph nodes, complete pathologic response.  At this time we will recommend continuing adjuvant Herceptin and pertuzumab for a total duration of 1 year and consider adjuvant antiestrogen therapy after completion of adjuvant radiation --EF showed a slight drop, following with cardiac oncology, Dr Haroldine Laws, ok to continue herceptin and perjeta. Last echo from August 2023 with satisfactory EF, next 1 scheduled in November.  At this time there are no clinical concerns for toxicity.  She is tolerating the combination of Herceptin and pertuzumab very well except for mild diarrhea.  With tamoxifen she has noted pretty severe hot flashes but is reluctant to try anything for hot flashes.  She understands that there is no good data for low-dose tamoxifen and invasive breast cancer hence she will continue 20 mg daily.  She will return to clinic in 6 weeks for follow-up or sooner as needed.  Infusion and labs every 3 weeks.    I change the mammogram to February 2024  Total time spent: 30 minutes including history, physical, review of records, counseling and coordination of care

## 2022-11-11 ENCOUNTER — Encounter (HOSPITAL_COMMUNITY): Payer: Self-pay | Admitting: Internal Medicine

## 2022-11-11 ENCOUNTER — Ambulatory Visit (HOSPITAL_COMMUNITY)
Admission: RE | Admit: 2022-11-11 | Discharge: 2022-11-11 | Disposition: A | Discharge status: Home / Self Care | Payer: 59 | Source: Ambulatory Visit | Attending: Internal Medicine | Admitting: Internal Medicine

## 2022-11-11 ENCOUNTER — Ambulatory Visit (HOSPITAL_BASED_OUTPATIENT_CLINIC_OR_DEPARTMENT_OTHER)
Admission: RE | Admit: 2022-11-11 | Discharge: 2022-11-11 | Disposition: A | Discharge status: Home / Self Care | Payer: 59 | Source: Ambulatory Visit | Attending: Internal Medicine | Admitting: Internal Medicine

## 2022-11-11 VITALS — BP 110/70 | HR 70 | Wt 184.4 lb

## 2022-11-11 DIAGNOSIS — Z170 Estrogen receptor positive status [ER+]: Secondary | ICD-10-CM | POA: Insufficient documentation

## 2022-11-11 DIAGNOSIS — I959 Hypotension, unspecified: Secondary | ICD-10-CM | POA: Insufficient documentation

## 2022-11-11 DIAGNOSIS — N6011 Diffuse cystic mastopathy of right breast: Secondary | ICD-10-CM | POA: Diagnosis not present

## 2022-11-11 DIAGNOSIS — Z0189 Encounter for other specified special examinations: Secondary | ICD-10-CM | POA: Diagnosis not present

## 2022-11-11 DIAGNOSIS — T451X5A Adverse effect of antineoplastic and immunosuppressive drugs, initial encounter: Secondary | ICD-10-CM

## 2022-11-11 DIAGNOSIS — I427 Cardiomyopathy due to drug and external agent: Secondary | ICD-10-CM | POA: Diagnosis not present

## 2022-11-11 DIAGNOSIS — C50811 Malignant neoplasm of overlapping sites of right female breast: Secondary | ICD-10-CM | POA: Insufficient documentation

## 2022-11-11 DIAGNOSIS — C50411 Malignant neoplasm of upper-outer quadrant of right female breast: Secondary | ICD-10-CM

## 2022-11-11 DIAGNOSIS — Z853 Personal history of malignant neoplasm of breast: Secondary | ICD-10-CM | POA: Insufficient documentation

## 2022-11-11 DIAGNOSIS — Z9221 Personal history of antineoplastic chemotherapy: Secondary | ICD-10-CM | POA: Insufficient documentation

## 2022-11-11 LAB — ECHOCARDIOGRAM COMPLETE
Area-P 1/2: 3.85 cm2
Calc EF: 57.5 %
S' Lateral: 3.2 cm
Single Plane A2C EF: 57.5 %
Single Plane A4C EF: 57.4 %

## 2022-11-11 NOTE — Progress Notes (Signed)
CARDIO-ONCOLOGY CLINIC NOTE  Referring Physician: Dr. Chryl Heck Primary Care: Pcp, No Primary Cardiologist: New  HPI:  Tonya Blair is a 46 y.o. female with right  breast cancer referred by Dr. Chryl Heck for enrollment into the Cardio-Oncology program.   Oncology History  Malignant neoplasm of upper-outer quadrant of right breast in female, estrogen receptor positive ([place])  12/31/2021 Initial Diagnosis    Malignant neoplasm of upper-outer quadrant of right breast in female, estrogen receptor positive ([place])      01/02/2022 Cancer Staging    Staging form: Breast, AJCC 8th Edition - Clinical stage from 01/02/2022: Stage IB (cT2, cN0, cM0, G3, ER+, PR+, HER2+) - Signed by Benay Pike, MD on 01/02/2022 Histologic grading system: 3 grade system      01/14/2022 Genetic Testing    Negative hereditary cancer genetic testing: no pathogenic variants detected in [facility] CustomNext-Cancer +RNAinsight Panel.  The report date is January 14, 2022.   The CustomNext-Cancer+RNAinsight panel offered by [facility] includes sequencing and rearrangement analysis for the following 47 genes:  APC, ATM, AXIN2, BARD1, BMPR1A, BRCA1, BRCA2, BRIP1, CDH1, CDK4, CDKN2A, CHEK2, DICER1, EPCAM, GREM1, HOXB13, MEN1, MLH1, MSH2, MSH3, MSH6, MUTYH, NBN, NF1, NF2, NTHL1, PALB2, PMS2, POLD1, POLE, PTEN, RAD51C, RAD51D, RECQL, RET, SDHA, SDHAF2, SDHB, SDHC, SDHD, SMAD4, SMARCA4, STK11, TP53, TSC1, TSC2, and VHL.  RNA data is routinely analyzed for use in variant interpretation for all genes.    01/16/2022 -  Chemotherapy    Patient is on Treatment Plan : BREAST  Docetaxel + Carboplatin + Trastuzumab + Pertuzumab  (TCHP) q21d            ONCOLOGIC HISTORY: 1) 12/11/2021: Diagnostic mammogram and ultrasound showed suspicious mass in the 9 o'clock region of the right breast. No evidence of enlarged adenopathy.   2) 12/25/2021: Underwent US guided right breast core needle biopsy of breast mass at 9 o'clock. 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  Prognostics from this mass showed ER 70% moderate, PR 20% moderate, HER2 positive by IHC, Ki-67 of 30%.   3) 01/09/2022:  MR Breast Bilateral: Biopsy proven malignancy in the outer right breast with associated linear/nodular enhancement extending anteriorly altogether measuring up to 5.1 cm. Indeterminate 0.6 cm tubular enhancing mass in the outer left breast.   4) 01/16/2022: Initiated neoadjuvant therapy with TCHP q 21 days given T2 tumor with HER2 amplification.    5) 01/17/2022: Underwent MRI guided biopsy of indeterminate 6 mm enhancing mass over the left outer lower quadrant. 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 No evidence of malignancy.    6) Completed neoadjuvant chemotherapy TCHP from 01/16/2022 to 05/01/2022  Got H/P on 5/30 and further dosing stopped due to reduced EF on echo . We saw her for the first time in 7/23 and Herceptin/Perjeta restarted. Toprol and losartan started. She is here for f/u echo.   Doing well. Active. Working out in gym. No SOB, edema. Will finish H/P in January   Echo today EF 55-60% GLS -23% Echo today 08/12/22 EF 55-60% GLS -24.3% Personally reviewed    ECHO: 12/29/21 EF 60-65% GLS -22.5% 03/29/22 EF 60-65% GLS -19.7 % (underestimated on 3-chamber)  06/26/22 EF 53% GLS -18.8%   Past Medical History:  Diagnosis Date   Anemia    IDA   Cancer ([place])    right breast cancer   Family history of prostate cancer 01/02/2022    Current Outpatient Medications  Medication Sig  Dispense Refill   Ascorbic Acid (VITAMIN C PO) Take 1 tablet by mouth in the morning.     BIOTIN PO Take 1 tablet by mouth in the morning.     Cholecalciferol (VITAMIN D3 PO) Take 1 tablet by mouth in the morning.     COD LIVER OIL PO Take by mouth.     ibuprofen (ADVIL) 200 MG tablet Take 200 mg by mouth every 8 (eight) hours as needed (pain.).      lidocaine-prilocaine (EMLA) cream Apply to affected area once 30 g 3   losartan (COZAAR) 25 MG tablet Take 0.5 tablets (12.5 mg total) by mouth at bedtime. 15 tablet 3   metoprolol succinate (TOPROL XL) 25 MG 24 hr tablet Take 0.5 tablets (12.5 mg total) by mouth at bedtime. 15 tablet 3   Multiple Minerals-Vitamins (CAL MAG ZINC +D3 PO) Take 1 tablet by mouth in the morning.     OVER THE COUNTER MEDICATION Take 1 capsule by mouth in the morning. Sea Moss Superfood Capsule     oxyCODONE (OXY IR/ROXICODONE) 5 MG immediate release tablet Take 1 tablet (5 mg total) by mouth every 6 (six) hours as needed. 10 tablet 0   paragard intrauterine copper IUD IUD 1 each by Intrauterine route once.     tamoxifen (NOLVADEX) 20 MG tablet Take 1 tablet (20 mg total) by mouth daily. 90 tablet 3   traMADol (ULTRAM) 50 MG tablet Take 2 tablets (100 mg total) by mouth every 6 (six) hours as needed. 10 tablet 0   Turmeric (QC TUMERIC COMPLEX PO) Take by mouth.     No current facility-administered medications for this encounter.    Allergies  Allergen Reactions   Codeine Nausea And Vomiting   Kiwi Extract Itching and Swelling      Social History   Socioeconomic History   Marital status: Single    Spouse name: Not on file   Number of children: Not on file   Years of education: Not on file   Highest education level: Not on file  Occupational History   Not on file  Tobacco Use   Smoking status: Never   Smokeless tobacco: Never  Vaping Use   Vaping Use: Never used  Substance and Sexual Activity   Alcohol use: Yes    Comment: occasional wine   Drug use: Never   Sexual activity: Yes    Birth control/protection: I.U.D.    Comment: Mirena IUD  Other Topics Concern   Not on file  Social History Narrative   Not on file   Social Determinants of Health   Financial Resource Strain: Low Risk  (01/02/2022)   Overall Financial Resource Strain (CARDIA)    Difficulty of Paying Living Expenses: Not hard at  all  Food Insecurity: No Food Insecurity (01/02/2022)   Hunger Vital Sign    Worried About Running Out of Food in the Last Year: Never true    Ran Out of Food in the Last Year: Never true  Transportation Needs: No Transportation Needs (01/02/2022)   PRAPARE - Hydrologist (Medical): No    Lack of Transportation (Non-Medical): No  Physical Activity: Not on file  Stress: Not on file  Social Connections: Not on file  Intimate Partner Violence: Not on file      Family History  Problem Relation Age of Onset   Prostate cancer Father 38    Vitals:   11/11/22 0947  BP: 110/70  Pulse: 70  SpO2:  98%  Weight: 83.6 kg (184 lb 6.4 oz)     PHYSICAL EXAM: General:  Well appearing. No resp difficulty HEENT: normal Neck: supple. no JVD. Carotids 2+ bilat; no bruits. No lymphadenopathy or thryomegaly appreciated. Cor: PMI nondisplaced. Regular rate & rhythm. No rubs, gallops or murmurs. Lungs: clear Abdomen: soft, nontender, nondistended. No hepatosplenomegaly. No bruits or masses. Good bowel sounds. Extremities: no cyanosis, clubbing, rash, edema Neuro: alert & orientedx3, cranial nerves grossly intact. moves all 4 extremities w/o difficulty. Affect pleasant   ASSESSMENT & PLAN:  1. 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 Diagnosed 1/23  - Completed neoadjuvant chemotherapy TCHP from 01/16/2022 to 05/01/2022 - Scheduled for H/P q21 days. Received 1 dose 05/21/22 -> stopped due to decreased EF - Echo 12/29/21 EF 60-65% GLS -22.5% - Echo 03/29/22 EF 60-65% GLS -19.7 % (underestimated on 3-chamber)  - Echo 06/26/22 EF 53% GLS -18.8% - Echo  08/12/22 EF 55-60% GLS -24.3% Personally reviewed - I suspect she had mild herceptin/perjeta cardiotoxicity. Now improved - Struggling with low BP on losartan 12.5 and Toprol 12.5 (decreased at last visit due to low BP) - Continue Herceptin/Perjeta. Follow echo q 3 months.   - Will be done with therapy at end of January -> reepat echo at that time and can stop Toprol/losartan if stable   Glori Bickers, MD  10:12 AM

## 2022-11-11 NOTE — Addendum Note (Signed)
Encounter addended by: Stanford Scotland, RN on: 11/11/2022 10:21 AM  Actions taken: Order list changed, Diagnosis association updated, Clinical Note Signed

## 2022-11-11 NOTE — Patient Instructions (Signed)
Good to see you today!  Your physician has requested that you have an echocardiogram. Echocardiography is a painless test that uses sound waves to create images of your heart. It provides your doctor with information about the size and shape of your heart and how well your heart's chambers and valves are working. This procedure takes approximately one hour. There are no restrictions for this procedure. Please do NOT wear cologne, perfume, aftershave, or lotions (deodorant is allowed). Please arrive 15 minutes prior to your appointment time.  Your physician recommends that you schedule a follow-up appointment in: 3 months with echocardiogram( February 2024) Call in December 2023 to schedule an appointment   If you have any questions or concerns before your next appointment please send [place] a message through [facility] or call our office at [phone].    TO LEAVE A MESSAGE FOR THE NURSE SELECT OPTION 2, PLEASE LEAVE A MESSAGE INCLUDING: YOUR NAME DATE OF BIRTH CALL BACK NUMBER REASON FOR CALL**this is important as we prioritize the call backs  YOU WILL RECEIVE A CALL BACK THE SAME DAY AS LONG AS YOU CALL BEFORE 4:00 PM  At the [facility], you and your health needs are our priority. As part of our continuing mission to provide you with exceptional heart care, we have created designated Provider Care Teams. These Care Teams include your primary Cardiologist (physician) and Advanced Practice Providers (APPs- Physician Assistants and Nurse Practitioners) who all work together to provide you with the care you need, when you need it.   You may see any of the following providers on your designated Care Team at your next follow up: Dr Glori Bickers Dr Loralie Champagne Dr. Roxana Hires, NP Lyda Jester, [place] [facility] [place], [place] Forestine Na, NP Audry Riles, PharmD   Please be sure to bring in all your medications bottles to every appointment.

## 2022-11-11 NOTE — Progress Notes (Signed)
Echocardiogram 2D Echocardiogram has been performed.  Tonya Blair 11/11/2022, 9:54 AM

## 2022-11-12 ENCOUNTER — Telehealth: Payer: Self-pay

## 2022-11-12 NOTE — Patient Outreach (Signed)
  Care Coordination   11/12/2022 Name: Tonya Blair MRN: [id number] DOB: February 15, 1976   Care Coordination Outreach Attempts:  A second unsuccessful outreach was attempted today to offer the patient with information about available care coordination services as a benefit of their health plan.     Follow Up Plan:  Additional outreach attempts will be made to offer the patient care coordination information and services.   Encounter Outcome:  No Answer  Care Coordination Interventions Activated:  No   Care Coordination Interventions:  No, not indicated    Peter Garter RN, BSN,CCM, [facility] [phone]

## 2022-11-19 ENCOUNTER — Encounter: Payer: Self-pay | Admitting: *Deleted

## 2022-11-19 ENCOUNTER — Telehealth: Payer: Self-pay | Admitting: Adult Health

## 2022-11-19 ENCOUNTER — Encounter: Payer: Self-pay | Admitting: Adult Health

## 2022-11-19 ENCOUNTER — Inpatient Hospital Stay (HOSPITAL_BASED_OUTPATIENT_CLINIC_OR_DEPARTMENT_OTHER): Payer: 59 | Admitting: Adult Health

## 2022-11-19 VITALS — BP 109/44 | HR 79 | Temp 97.8°F | Resp 16 | Wt 185.5 lb

## 2022-11-19 DIAGNOSIS — C50411 Malignant neoplasm of upper-outer quadrant of right female breast: Secondary | ICD-10-CM

## 2022-11-19 DIAGNOSIS — Z79899 Other long term (current) drug therapy: Secondary | ICD-10-CM | POA: Diagnosis not present

## 2022-11-19 DIAGNOSIS — Z170 Estrogen receptor positive status [ER+]: Secondary | ICD-10-CM

## 2022-11-19 DIAGNOSIS — Z1211 Encounter for screening for malignant neoplasm of colon: Secondary | ICD-10-CM

## 2022-11-19 DIAGNOSIS — Z5112 Encounter for antineoplastic immunotherapy: Secondary | ICD-10-CM | POA: Diagnosis not present

## 2022-11-19 NOTE — Telephone Encounter (Signed)
Scheduled appointment per 11/27 los. Left voicemail. 

## 2022-11-19 NOTE — Progress Notes (Signed)
SURVIVORSHIP VISIT:    BRIEF ONCOLOGIC HISTORY:  Oncology History  Malignant neoplasm of upper-outer quadrant of right breast in female, estrogen receptor positive ([place])  12/31/2021 Initial Diagnosis   Malignant neoplasm of upper-outer quadrant of right breast in female, estrogen receptor positive ([place])   01/02/2022 Cancer Staging   Staging form: Breast, AJCC 8th Edition - Clinical stage from 01/02/2022: Stage IB (cT2, cN0, cM0, G3, ER+, PR+, HER2+) - Signed by Benay Pike, MD on 01/02/2022 Histologic grading system: 3 grade system   01/14/2022 Genetic Testing   Negative hereditary cancer genetic testing: no pathogenic variants detected in [facility] CustomNext-Cancer +RNAinsight Panel.  The report date is January 14, 2022.  The CustomNext-Cancer+RNAinsight panel offered by [facility] includes sequencing and rearrangement analysis for the following 47 genes:  APC, ATM, AXIN2, BARD1, BMPR1A, BRCA1, BRCA2, BRIP1, CDH1, CDK4, CDKN2A, CHEK2, DICER1, EPCAM, GREM1, HOXB13, MEN1, MLH1, MSH2, MSH3, MSH6, MUTYH, NBN, NF1, NF2, NTHL1, PALB2, PMS2, POLD1, POLE, PTEN, RAD51C, RAD51D, RECQL, RET, SDHA, SDHAF2, SDHB, SDHC, SDHD, SMAD4, SMARCA4, STK11, TP53, TSC1, TSC2, and VHL.  RNA data is routinely analyzed for use in variant interpretation for all genes.   01/16/2022 - 08/14/2022 Chemotherapy   Completed chemotherapy May 2023, received 6 cycles of neoadjuvant TCHP Patient is on Treatment Plan : BREAST  Docetaxel + Carboplatin + Trastuzumab + Pertuzumab  (TCHP) q21d       01/16/2022 -  Chemotherapy   Patient is on Treatment Plan : BREAST  Docetaxel + Carboplatin + Trastuzumab + Pertuzumab  (TCHP) q21d / Trastuzumab + Pertuzumab q21d     06/19/2022 Definitive Surgery   Right breast lumpectomy showed fibrosis with mild inflammation, negative for residual carcinoma, all surgical margins negative for carcinoma, right axillary sentinel lymph node negative for metastatic carcinoma, final pathologic staging  [id number]   07/18/2022 - 08/09/2022 Radiation Therapy   Site Technique Total Dose (Gy) Dose per Fx (Gy) Completed Fx Beam Energies  Breast, Right: Breast_R 3D 42.56/42.56 2.66 16/16 10X, 6XFFF     07/2022 -  Anti-estrogen oral therapy   Tamoxifen     INTERVAL HISTORY:  Tonya Blair to review her survivorship care plan detailing her treatment course for breast cancer, as well as monitoring long-term side effects of that treatment, education regarding health maintenance, screening, and overall wellness and health promotion.     She is taking Tamoxifen daily and is tolerating it moderately well.  Her main issue with taking Tamoxifen is hot flashes.  She is experiencing about 3 per day.  She has noticed that a tea she is drinking has helped with the hot flashes.    Her most recent echo occurred on 11/11/2022 and showed an EF of 55-60% and normal global longitudinal strain.  REVIEW OF SYSTEMS:  Review of Systems  Constitutional:  Negative for appetite change, chills, fatigue, fever and unexpected weight change.  HENT:   Negative for hearing loss, lump/mass and trouble swallowing.   Eyes:  Negative for eye problems and icterus.  Respiratory:  Negative for chest tightness, cough and shortness of breath.   Cardiovascular:  Negative for chest pain, leg swelling and palpitations.  Gastrointestinal:  Negative for abdominal distention, abdominal pain, constipation, diarrhea, nausea and vomiting.  Endocrine: Positive for hot flashes.  Genitourinary:  Negative for difficulty urinating.   Musculoskeletal:  Negative for arthralgias.  Skin:  Negative for itching and rash.  Neurological:  Negative for dizziness, extremity weakness, headaches and numbness.  Hematological:  Negative for adenopathy. Does not bruise/bleed easily.  Psychiatric/Behavioral:  Negative for depression. The patient is not nervous/anxious.    Breast: Denies any new nodularity, masses, tenderness, nipple changes, or nipple discharge.       ONCOLOGY TREATMENT TEAM:  1. Surgeon:  Dr. Donne Hazel at [facility] 2. Medical Oncologist: Dr. Chryl Heck  3. Radiation Oncologist: Dr. Isidore Moos    PAST MEDICAL/SURGICAL HISTORY:  Past Medical History:  Diagnosis Date   Anemia    IDA   Cancer ([place])    right breast cancer   Family history of prostate cancer 01/02/2022   Past Surgical History:  Procedure Laterality Date   BREAST BIOPSY Right 12/25/2021   BREAST LUMPECTOMY WITH RADIOACTIVE SEED AND SENTINEL LYMPH NODE BIOPSY Right 06/19/2022   Procedure: RADIOACTIVE SEED GUIDED RIGHT BREAST LUMPECTOMY, RIGHT AXILLARY SENTINEL LYMPH NODE BIOPSY;  Surgeon: Rolm Bookbinder, MD;  Location: [facility];  Service: General;  Laterality: Right;   PORTACATH PLACEMENT N/A 01/14/2022   Procedure: INSERTION PORT-A-CATH;  Surgeon: Rolm Bookbinder, MD;  Location: [facility];  Service: General;  Laterality: N/A;   THERAPEUTIC ABORTION     WISDOM TOOTH EXTRACTION       ALLERGIES:  Allergies  Allergen Reactions   Codeine Nausea And Vomiting   Kiwi Extract Itching and Swelling     CURRENT MEDICATIONS:  Outpatient Encounter Medications as of 11/19/2022  Medication Sig   Ascorbic Acid (VITAMIN C PO) Take 1 tablet by mouth in the morning.   BIOTIN PO Take 1 tablet by mouth in the morning.   Cholecalciferol (VITAMIN D3 PO) Take 1 tablet by mouth in the morning.   COD LIVER OIL PO Take by mouth.   ibuprofen (ADVIL) 200 MG tablet Take 200 mg by mouth every 8 (eight) hours as needed (pain.).   lidocaine-prilocaine (EMLA) cream Apply to affected area once   losartan (COZAAR) 25 MG tablet Take 0.5 tablets (12.5 mg total) by mouth at bedtime.   metoprolol succinate (TOPROL XL) 25 MG 24 hr tablet Take 0.5 tablets (12.5 mg total) by mouth at bedtime.   Multiple Minerals-Vitamins (CAL MAG ZINC +D3 PO) Take 1 tablet by mouth in the morning.   OVER THE COUNTER MEDICATION Take 1 capsule by mouth in the morning. Sea Moss  Superfood Capsule   oxyCODONE (OXY IR/ROXICODONE) 5 MG immediate release tablet Take 1 tablet (5 mg total) by mouth every 6 (six) hours as needed.   paragard intrauterine copper IUD IUD 1 each by Intrauterine route once.   tamoxifen (NOLVADEX) 20 MG tablet Take 1 tablet (20 mg total) by mouth daily.   traMADol (ULTRAM) 50 MG tablet Take 2 tablets (100 mg total) by mouth every 6 (six) hours as needed.   Turmeric (QC TUMERIC COMPLEX PO) Take by mouth.   No facility-administered encounter medications on file as of 11/19/2022.     ONCOLOGIC FAMILY HISTORY:  Family History  Problem Relation Age of Onset   Prostate cancer Father 66      SOCIAL HISTORY:  Social History   Socioeconomic History   Marital status: Single    Spouse name: Not on file   Number of children: Not on file   Years of education: Not on file   Highest education level: Not on file  Occupational History   Not on file  Tobacco Use   Smoking status: Never   Smokeless tobacco: Never  Vaping Use   Vaping Use: Never used  Substance and Sexual Activity   Alcohol use: Yes    Comment: occasional wine   Drug  use: Never   Sexual activity: Yes    Birth control/protection: I.U.D.    Comment: Mirena IUD  Other Topics Concern   Not on file  Social History Narrative   Not on file   Social Determinants of Health   Financial Resource Strain: Low Risk  (01/02/2022)   Overall Financial Resource Strain (CARDIA)    Difficulty of Paying Living Expenses: Not hard at all  Food Insecurity: No Food Insecurity (01/02/2022)   Hunger Vital Sign    Worried About Running Out of Food in the Last Year: Never true    Ran Out of Food in the Last Year: Never true  Transportation Needs: No Transportation Needs (01/02/2022)   PRAPARE - Hydrologist (Medical): No    Lack of Transportation (Non-Medical): No  Physical Activity: Not on file  Stress: Not on file  Social Connections: Not on file  Intimate  Partner Violence: Not on file     OBSERVATIONS/OBJECTIVE:  BP (!) 109/44 (BP Location: Left Arm, Patient Position: Sitting) Comment: provider notified  Pulse 79   Temp 97.8 F (36.6 C)   Resp 16   Wt 185 lb 8 oz (84.1 kg)   SpO2 97%   BMI 33.93 kg/m  GENERAL: Patient is a well appearing female in no acute distress HEENT:  Sclerae anicteric.  Oropharynx clear and moist. No ulcerations or evidence of oropharyngeal candidiasis. Neck is supple.  NODES:  No cervical, supraclavicular, or axillary lymphadenopathy palpated.  BREAST EXAM:  right breast s/p lumpectomy and radiation, no sign of local recurrence, left breast benign LUNGS:  Clear to auscultation bilaterally.  No wheezes or rhonchi. HEART:  Regular rate and rhythm. No murmur appreciated. ABDOMEN:  Soft, nontender.  Positive, normoactive bowel sounds. No organomegaly palpated. MSK:  No focal spinal tenderness to palpation. Full range of motion bilaterally in the upper extremities. EXTREMITIES:  No peripheral edema.   SKIN:  Clear with no obvious rashes or skin changes. No nail dyscrasia. NEURO:  Nonfocal. Well oriented.  Appropriate affect.  LABORATORY DATA:  None for this visit.  DIAGNOSTIC IMAGING:  None for this visit.      ASSESSMENT AND PLAN:  Tonya Blair is a pleasant 46 y.o. female with Stage IB right breast invasive ductal carcinoma, ER+/PR+/HER2+, diagnosed in 12/2021, treated with neoadjuvant chemotherapy, lumpectomy, maintenance Herceptin/Perjeta, adjuvant radiation therapy, and anti-estrogen therapy with Tamoxifen beginning in 07/2022.  She presents to the Survivorship Clinic for our initial meeting and routine follow-up post-completion of treatment for breast cancer.    1. Stage IB left breast cancer:  Tonya Blair is continuing to recover from definitive treatment for breast cancer. She will return for Herceptin Perjeta which will finish in January 2024.  She will continue her anti-estrogen therapy with tamoxifen.  Thus far, she is tolerating the tamoxifen well, with minimal side effects--other than hot flashes which she is managing with the tea that she drinks in the evenings.. Her mammogram is due January 2024; orders placed today.  Today, a comprehensive survivorship care plan and treatment summary was reviewed with the patient today detailing her breast cancer diagnosis, treatment course, potential late/long-term effects of treatment, appropriate follow-up care with recommendations for the future, and patient education resources.  A copy of this summary, along with a letter will be sent to the patient's primary care provider via mail/fax/In Basket message after today's visit.    2. Bone health:  She was given education on specific activities to promote bone health.  3. Cancer screening:  Due to Tonya Blair's history and her age, she should receive screening for skin cancers, colon cancer, and gynecologic cancers.  The information and recommendations are listed on the patient's comprehensive care plan/treatment summary and were reviewed in detail with the patient.    4. Health maintenance and wellness promotion: Tonya Blair was encouraged to consume 5-7 servings of fruits and vegetables per day. We reviewed the "Nutrition Rainbow" handout.  She was also encouraged to engage in moderate to vigorous exercise for 30 minutes per day most days of the week. We discussed the [employer] fitness program, which is designed for cancer survivors to help them become more physically fit after cancer treatments.  She was instructed to limit her alcohol consumption and continue to abstain from tobacco use.     5. Support services/counseling: It is not uncommon for this period of the patient's cancer care trajectory to be one of many emotions and stressors.  She was given information regarding our available services and encouraged to contact me with any questions or for help enrolling in any of our support group/programs.     Follow up instructions:    -Return to cancer center every 3 weeks to finish Herceptin -Mammogram due in January 2024 -Follow up with surgery after Herceptin completion for port removal -She is welcome to return back to the Survivorship Clinic at any time; no additional follow-up needed at this time.  -Consider referral back to survivorship as a long-term survivor for continued surveillance  The patient was provided an opportunity to ask questions and all were answered. The patient agreed with the plan and demonstrated an understanding of the instructions.   Total encounter time:45 minutes*in face-to-face visit time, chart review, lab review, care coordination, order entry, and documentation of the encounter time.    Wilber Bihari, NP 11/19/22 8:26 AM Medical Oncology and Hematology [facility] [place], [place] [address] Tel. [phone]    Fax. [phone]  *Total Encounter Time as defined by the Centers for Medicare and Medicaid Services includes, in addition to the face-to-face time of a patient visit (documented in the note above) non-face-to-face time: obtaining and reviewing outside history, ordering and reviewing medications, tests or procedures, care coordination (communications with other health care professionals or caregivers) and documentation in the medical record.

## 2022-11-26 ENCOUNTER — Other Ambulatory Visit: Payer: Self-pay | Admitting: *Deleted

## 2022-11-26 DIAGNOSIS — Z170 Estrogen receptor positive status [ER+]: Secondary | ICD-10-CM

## 2022-11-27 ENCOUNTER — Inpatient Hospital Stay: Payer: 59

## 2022-11-27 ENCOUNTER — Other Ambulatory Visit: Payer: Self-pay | Admitting: Hematology and Oncology

## 2022-11-27 ENCOUNTER — Other Ambulatory Visit: Payer: Self-pay

## 2022-11-27 ENCOUNTER — Inpatient Hospital Stay: Payer: 59 | Attending: Hematology and Oncology

## 2022-11-27 VITALS — BP 125/68 | HR 83 | Temp 98.0°F | Resp 18 | Wt 189.8 lb

## 2022-11-27 DIAGNOSIS — Z79899 Other long term (current) drug therapy: Secondary | ICD-10-CM | POA: Diagnosis not present

## 2022-11-27 DIAGNOSIS — C50411 Malignant neoplasm of upper-outer quadrant of right female breast: Secondary | ICD-10-CM

## 2022-11-27 DIAGNOSIS — Z95828 Presence of other vascular implants and grafts: Secondary | ICD-10-CM

## 2022-11-27 DIAGNOSIS — Z5112 Encounter for antineoplastic immunotherapy: Secondary | ICD-10-CM | POA: Diagnosis not present

## 2022-11-27 LAB — CBC WITH DIFFERENTIAL (CANCER CENTER ONLY)
Abs Immature Granulocytes: 0.01 10*3/uL (ref 0.00–0.07)
Basophils Absolute: 0 10*3/uL (ref 0.0–0.1)
Basophils Relative: 0 %
Eosinophils Absolute: 0.2 10*3/uL (ref 0.0–0.5)
Eosinophils Relative: 5 %
HCT: 33.2 % — ABNORMAL LOW (ref 36.0–46.0)
Hemoglobin: 10.7 g/dL — ABNORMAL LOW (ref 12.0–15.0)
Immature Granulocytes: 0 %
Lymphocytes Relative: 33 %
Lymphs Abs: 1.5 10*3/uL (ref 0.7–4.0)
MCH: 26.6 pg (ref 26.0–34.0)
MCHC: 32.2 g/dL (ref 30.0–36.0)
MCV: 82.4 fL (ref 80.0–100.0)
Monocytes Absolute: 0.4 10*3/uL (ref 0.1–1.0)
Monocytes Relative: 9 %
Neutro Abs: 2.4 10*3/uL (ref 1.7–7.7)
Neutrophils Relative %: 53 %
Platelet Count: 251 10*3/uL (ref 150–400)
RBC: 4.03 MIL/uL (ref 3.87–5.11)
RDW: 15.8 % — ABNORMAL HIGH (ref 11.5–15.5)
WBC Count: 4.5 10*3/uL (ref 4.0–10.5)
nRBC: 0 % (ref 0.0–0.2)

## 2022-11-27 LAB — CMP (CANCER CENTER ONLY)
ALT: 13 U/L (ref 0–44)
AST: 15 U/L (ref 15–41)
Albumin: 3.9 g/dL (ref 3.5–5.0)
Alkaline Phosphatase: 79 U/L (ref 38–126)
Anion gap: 3 — ABNORMAL LOW (ref 5–15)
BUN: 12 mg/dL (ref 6–20)
CO2: 29 mmol/L (ref 22–32)
Calcium: 9.4 mg/dL (ref 8.9–10.3)
Chloride: 109 mmol/L (ref 98–111)
Creatinine: 0.82 mg/dL (ref 0.44–1.00)
GFR, Estimated: >60 mL/min (ref >60)
Glucose, Bld: 127 mg/dL — ABNORMAL HIGH (ref 70–99)
Potassium: 3.8 mmol/L (ref 3.5–5.1)
Sodium: 141 mmol/L (ref 135–145)
Total Bilirubin: 0.3 mg/dL (ref 0.3–1.2)
Total Protein: 6.6 g/dL (ref 6.5–8.1)

## 2022-11-27 MED ORDER — HEPARIN SOD (PORK) LOCK FLUSH 100 UNIT/ML IV SOLN
500.0000 [IU] | Freq: Once | INTRAVENOUS | Status: AC | PRN
  Administered 2022-11-27: 500 [IU]

## 2022-11-27 MED ORDER — TRASTUZUMAB-DKST CHEMO 150 MG IV SOLR
6.0000 mg/kg | Freq: Once | INTRAVENOUS | Status: AC
  Administered 2022-11-27: 504 mg via INTRAVENOUS
  Filled 2022-11-27: qty 24

## 2022-11-27 MED ORDER — SODIUM CHLORIDE 0.9% FLUSH
10.0000 mL | INTRAVENOUS | Status: DC | PRN
  Administered 2022-11-27: 10 mL

## 2022-11-27 MED ORDER — SODIUM CHLORIDE 0.9 % IV SOLN
420.0000 mg | Freq: Once | INTRAVENOUS | Status: AC
  Administered 2022-11-27: 420 mg via INTRAVENOUS
  Filled 2022-11-27: qty 14

## 2022-11-27 MED ORDER — ACETAMINOPHEN 325 MG PO TABS
650.0000 mg | ORAL_TABLET | Freq: Once | ORAL | Status: AC
  Administered 2022-11-27: 650 mg via ORAL
  Filled 2022-11-27: qty 2

## 2022-11-27 MED ORDER — SODIUM CHLORIDE 0.9% FLUSH
10.0000 mL | Freq: Once | INTRAVENOUS | Status: AC
  Administered 2022-11-27: 10 mL

## 2022-11-27 MED ORDER — SODIUM CHLORIDE 0.9 % IV SOLN: Freq: Once | INTRAVENOUS | Status: AC

## 2022-11-27 NOTE — Patient Instructions (Signed)
[facility]  Discharge Instructions: Thank you for choosing [facility] to provide your oncology and hematology care.   If you have a lab appointment with the [facility], please go directly to the [facility] and check in at the registration area.   Wear comfortable clothing and clothing appropriate for easy access to any Portacath or PICC line.   We strive to give you quality time with your provider. You may need to reschedule your appointment if you arrive late (15 or more minutes).  Arriving late affects you and other patients whose appointments are after yours.  Also, if you miss three or more appointments without notifying the office, you may be dismissed from the clinic at the provider's discretion.      For prescription refill requests, have your pharmacy contact our office and allow 72 hours for refills to be completed.    Today you received the following chemotherapy and/or immunotherapy agents: Trastuzumab, Pertuzumab.       To help prevent nausea and vomiting after your treatment, we encourage you to take your nausea medication as directed.  BELOW ARE SYMPTOMS THAT SHOULD BE REPORTED IMMEDIATELY: *FEVER GREATER THAN 100.4 F (38 C) OR HIGHER *CHILLS OR SWEATING *NAUSEA AND VOMITING THAT IS NOT CONTROLLED WITH YOUR NAUSEA MEDICATION *UNUSUAL SHORTNESS OF BREATH *UNUSUAL BRUISING OR BLEEDING *URINARY PROBLEMS (pain or burning when urinating, or frequent urination) *BOWEL PROBLEMS (unusual diarrhea, constipation, pain near the anus) TENDERNESS IN MOUTH AND THROAT WITH OR WITHOUT PRESENCE OF ULCERS (sore throat, sores in mouth, or a toothache) UNUSUAL RASH, SWELLING OR PAIN  UNUSUAL VAGINAL DISCHARGE OR ITCHING   Items with * indicate a potential emergency and should be followed up as soon as possible or go to the Emergency Department if any problems should occur.  Please show the CHEMOTHERAPY ALERT CARD or IMMUNOTHERAPY ALERT CARD  at check-in to the Emergency Department and triage nurse.  Should you have questions after your visit or need to cancel or reschedule your appointment, please contact [facility]  Dept: [phone]  and follow the prompts.  Office hours are 8:00 a.m. to 4:30 p.m. Monday - Friday. Please note that voicemails left after 4:00 p.m. may not be returned until the following business day.  We are closed weekends and major holidays. You have access to a nurse at all times for urgent questions. Please call the main number to the clinic Dept: [phone] and follow the prompts.   For any non-urgent questions, you may also contact your provider using MyChart. We now offer e-Visits for anyone 50 and older to request care online for non-urgent symptoms. For details visit [email or web address].   Also download the MyChart app! Go to the app store, search "MyChart", open the app, select [facility], and log in with your MyChart username and password.  Masks are optional in the cancer centers. If you would like for your care team to wear a mask while they are taking care of you, please let them know. You may have one support person who is at least 47 years old accompany you for your appointments.

## 2022-11-28 ENCOUNTER — Telehealth: Payer: Self-pay

## 2022-11-28 NOTE — Patient Outreach (Signed)
  Care Coordination   11/28/2022 Name: Tonya Blair MRN: [id number] DOB: [birth year]   Care Coordination Outreach Attempts:  A third unsuccessful outreach was attempted today to offer the patient with information about available care coordination services as a benefit of their health plan.   Follow Up Plan:  No further outreach attempts will be made at this time. We have been unable to contact the patient to offer or enroll patient in care coordination services  Encounter Outcome:  No Answer   Care Coordination Interventions:  No, not indicated    Peter Garter RN, BSN,CCM, [facility] [phone]

## 2022-12-02 ENCOUNTER — Telehealth: Payer: Self-pay | Admitting: Adult Health

## 2022-12-02 NOTE — Telephone Encounter (Signed)
Rescheduled appointment per room/resource. Patient is aware of the changes made to her upcoming appointment. 

## 2022-12-03 ENCOUNTER — Encounter: Payer: Self-pay | Admitting: Hematology and Oncology

## 2022-12-17 NOTE — Progress Notes (Unsigned)
[facility] Follow up:    Pcp, No No address on file   DIAGNOSIS: Cancer Staging  Malignant neoplasm of upper-outer quadrant of right breast in female, estrogen receptor positive ([place]) Staging form: Breast, AJCC 8th Edition - Clinical stage from 01/02/2022: Stage IB (cT2, cN0, cM0, G3, ER+, PR+, HER2+) - Signed by Benay Pike, MD on 01/02/2022 Histologic grading system: 3 grade system   SUMMARY OF ONCOLOGIC HISTORY: Oncology History  Malignant neoplasm of upper-outer quadrant of right breast in female, estrogen receptor positive ([place])  12/31/2021 Initial Diagnosis   Malignant neoplasm of upper-outer quadrant of right breast in female, estrogen receptor positive ([place])   01/02/2022 Cancer Staging   Staging form: Breast, AJCC 8th Edition - Clinical stage from 01/02/2022: Stage IB (cT2, cN0, cM0, G3, ER+, PR+, HER2+) - Signed by Benay Pike, MD on 01/02/2022 Histologic grading system: 3 grade system   01/14/2022 Genetic Testing   Negative hereditary cancer genetic testing: no pathogenic variants detected in [facility] CustomNext-Cancer +RNAinsight Panel.  The report date is January 14, 2022.  The CustomNext-Cancer+RNAinsight panel offered by [facility] includes sequencing and rearrangement analysis for the following 47 genes:  APC, ATM, AXIN2, BARD1, BMPR1A, BRCA1, BRCA2, BRIP1, CDH1, CDK4, CDKN2A, CHEK2, DICER1, EPCAM, GREM1, HOXB13, MEN1, MLH1, MSH2, MSH3, MSH6, MUTYH, NBN, NF1, NF2, NTHL1, PALB2, PMS2, POLD1, POLE, PTEN, RAD51C, RAD51D, RECQL, RET, SDHA, SDHAF2, SDHB, SDHC, SDHD, SMAD4, SMARCA4, STK11, TP53, TSC1, TSC2, and VHL.  RNA data is routinely analyzed for use in variant interpretation for all genes.   01/16/2022 - 08/14/2022 Chemotherapy   Completed chemotherapy May 2023, received 6 cycles of neoadjuvant TCHP Patient is on Treatment Plan : BREAST  Docetaxel + Carboplatin + Trastuzumab + Pertuzumab  (TCHP) q21d       01/16/2022 -  Chemotherapy   Patient is  on Treatment Plan : BREAST  Docetaxel + Carboplatin + Trastuzumab + Pertuzumab  (TCHP) q21d / Trastuzumab + Pertuzumab q21d     06/19/2022 Definitive Surgery   Right breast lumpectomy showed fibrosis with mild inflammation, negative for residual carcinoma, all surgical margins negative for carcinoma, right axillary sentinel lymph node negative for metastatic carcinoma, final pathologic staging [id number]   07/18/2022 - 08/09/2022 Radiation Therapy   Site Technique Total Dose (Gy) Dose per Fx (Gy) Completed Fx Beam Energies  Breast, Right: Breast_R 3D 42.56/42.56 2.66 16/16 10X, 6XFFF     07/2022 -  Anti-estrogen oral therapy   Tamoxifen     CURRENT THERAPY: Herceptin/Perjeta; Tamoxifen  INTERVAL HISTORY: Tonya Blair 46 y.o. female returns for f/u of her breast cancer on treatment with tamoxifen and herceptin therapy.  She is scheduled for bilateral breast diagnostic mammogram on 01/07/2023.  Her most recent echocardiogram occurred on 11/11/2022 demonstrating a LVEF of 55-60% and normal global longitudinal strain.    Patient Active Problem List   Diagnosis Date Noted   Port-A-Cath in place 04/10/2022   Anemia due to antineoplastic chemotherapy 04/10/2022   Chemotherapy induced diarrhea 03/19/2022   Genetic testing 01/18/2022   Family history of prostate cancer 01/02/2022   Malignant neoplasm of upper-outer quadrant of right breast in female, estrogen receptor positive ([place]) 12/31/2021   Cervical intraepithelial neoplasia grade 2 10/28/2019    is allergic to codeine and kiwi extract.  MEDICAL HISTORY: Past Medical History:  Diagnosis Date   Anemia    IDA   Cancer ([place])    right breast cancer   Family history of prostate cancer 01/02/2022    SURGICAL HISTORY:  Past Surgical History:  Procedure Laterality Date   BREAST BIOPSY Right 12/25/2021   BREAST LUMPECTOMY WITH RADIOACTIVE SEED AND SENTINEL LYMPH NODE BIOPSY Right 06/19/2022   Procedure: RADIOACTIVE SEED GUIDED RIGHT  BREAST LUMPECTOMY, RIGHT AXILLARY SENTINEL LYMPH NODE BIOPSY;  Surgeon: Rolm Bookbinder, MD;  Location: [facility];  Service: General;  Laterality: Right;   PORTACATH PLACEMENT N/A 01/14/2022   Procedure: INSERTION PORT-A-CATH;  Surgeon: Rolm Bookbinder, MD;  Location: [facility];  Service: General;  Laterality: N/A;   THERAPEUTIC ABORTION     WISDOM TOOTH EXTRACTION      SOCIAL HISTORY: Social History   Socioeconomic History   Marital status: Single    Spouse name: Not on file   Number of children: Not on file   Years of education: Not on file   Highest education level: Not on file  Occupational History   Not on file  Tobacco Use   Smoking status: Never   Smokeless tobacco: Never  Vaping Use   Vaping Use: Never used  Substance and Sexual Activity   Alcohol use: Yes    Comment: occasional wine   Drug use: Never   Sexual activity: Yes    Birth control/protection: I.U.D.    Comment: Mirena IUD  Other Topics Concern   Not on file  Social History Narrative   Not on file   Social Determinants of Health   Financial Resource Strain: Low Risk  (01/02/2022)   Overall Financial Resource Strain (CARDIA)    Difficulty of Paying Living Expenses: Not hard at all  Food Insecurity: No Food Insecurity (01/02/2022)   Hunger Vital Sign    Worried About Running Out of Food in the Last Year: Never true    Ran Out of Food in the Last Year: Never true  Transportation Needs: No Transportation Needs (01/02/2022)   PRAPARE - Hydrologist (Medical): No    Lack of Transportation (Non-Medical): No  Physical Activity: Not on file  Stress: Not on file  Social Connections: Not on file  Intimate Partner Violence: Not on file    FAMILY HISTORY: Family History  Problem Relation Age of Onset   Prostate cancer Father 71    Review of Systems  Constitutional:  Negative for appetite change, chills, fatigue, fever and unexpected weight change.  HENT:    Negative for hearing loss, lump/mass and trouble swallowing.   Eyes:  Negative for eye problems and icterus.  Respiratory:  Negative for chest tightness, cough and shortness of breath.   Cardiovascular:  Negative for chest pain, leg swelling and palpitations.  Gastrointestinal:  Negative for abdominal distention, abdominal pain, constipation, diarrhea, nausea and vomiting.  Endocrine: Negative for hot flashes.  Genitourinary:  Negative for difficulty urinating.   Musculoskeletal:  Negative for arthralgias.  Skin:  Negative for itching and rash.  Neurological:  Negative for dizziness, extremity weakness, headaches and numbness.  Hematological:  Negative for adenopathy. Does not bruise/bleed easily.  Psychiatric/Behavioral:  Negative for depression. The patient is not nervous/anxious.       PHYSICAL EXAMINATION  ECOG PERFORMANCE STATUS: {CHL ONC ECOG PS:[id number]}  There were no vitals filed for this visit.  Physical Exam Constitutional:      General: She is not in acute distress.    Appearance: Normal appearance. She is not toxic-appearing.  HENT:     Head: Normocephalic and atraumatic.  Eyes:     General: No scleral icterus. Cardiovascular:     Rate and Rhythm: Normal rate  and regular rhythm.     Pulses: Normal pulses.     Heart sounds: Normal heart sounds.  Pulmonary:     Effort: Pulmonary effort is normal.     Breath sounds: Normal breath sounds.  Abdominal:     General: Abdomen is flat. Bowel sounds are normal. There is no distension.     Palpations: Abdomen is soft.     Tenderness: There is no abdominal tenderness.  Musculoskeletal:        General: No swelling.     Cervical back: Neck supple.  Lymphadenopathy:     Cervical: No cervical adenopathy.  Skin:    General: Skin is warm and dry.     Findings: No rash.  Neurological:     General: No focal deficit present.     Mental Status: She is alert.  Psychiatric:        Mood and Affect: Mood normal.         Behavior: Behavior normal.     LABORATORY DATA:  CBC    Component Value Date/Time   WBC 4.5 11/27/2022 0844   WBC 4.3 08/14/2022 0827   RBC 4.03 11/27/2022 0844   HGB 10.7 (L) 11/27/2022 0844   HCT 33.2 (L) 11/27/2022 0844   PLT 251 11/27/2022 0844   MCV 82.4 11/27/2022 0844   MCH 26.6 11/27/2022 0844   MCHC 32.2 11/27/2022 0844   RDW 15.8 (H) 11/27/2022 0844   LYMPHSABS 1.5 11/27/2022 0844   MONOABS 0.4 11/27/2022 0844   EOSABS 0.2 11/27/2022 0844   BASOSABS 0.0 11/27/2022 0844    CMP     Component Value Date/Time   NA 141 11/27/2022 0844   K 3.8 11/27/2022 0844   CL 109 11/27/2022 0844   CO2 29 11/27/2022 0844   GLUCOSE 127 (H) 11/27/2022 0844   BUN 12 11/27/2022 0844   CREATININE 0.82 11/27/2022 0844   CALCIUM 9.4 11/27/2022 0844   PROT 6.6 11/27/2022 0844   ALBUMIN 3.9 11/27/2022 0844   AST 15 11/27/2022 0844   ALT 13 11/27/2022 0844   ALKPHOS 79 11/27/2022 0844   BILITOT 0.3 11/27/2022 0844   GFRNONAA >60 11/27/2022 0844      ASSESSMENT and THERAPY PLAN:   No problem-specific Assessment & Plan notes found for this encounter.     All questions were answered. The patient knows to call the clinic with any problems, questions or concerns. We can certainly see the patient much sooner if necessary.  Total encounter time:*** minutes*in face-to-face visit time, chart review, lab review, care coordination, order entry, and documentation of the encounter time.  Wilber Bihari, NP 12/17/22 2:40 PM Medical Oncology and Hematology [facility] [place], [place] [address] Tel. [phone]    Fax. [phone]  *Total Encounter Time as defined by the Centers for Medicare and Medicaid Services includes, in addition to the face-to-face time of a patient visit (documented in the note above) non-face-to-face time: obtaining and reviewing outside history, ordering and reviewing medications, tests or procedures, care coordination  (communications with other health care professionals or caregivers) and documentation in the medical record.

## 2022-12-18 ENCOUNTER — Inpatient Hospital Stay: Payer: 59

## 2022-12-18 ENCOUNTER — Encounter: Payer: Self-pay | Admitting: Hematology and Oncology

## 2022-12-18 ENCOUNTER — Inpatient Hospital Stay (HOSPITAL_BASED_OUTPATIENT_CLINIC_OR_DEPARTMENT_OTHER): Payer: 59 | Admitting: Adult Health

## 2022-12-18 ENCOUNTER — Inpatient Hospital Stay: Payer: 59 | Admitting: Adult Health

## 2022-12-18 ENCOUNTER — Other Ambulatory Visit: Payer: Self-pay

## 2022-12-18 ENCOUNTER — Inpatient Hospital Stay: Payer: 59 | Admitting: Physician Assistant

## 2022-12-18 ENCOUNTER — Encounter: Payer: Self-pay | Admitting: Adult Health

## 2022-12-18 VITALS — BP 120/72 | HR 92 | Temp 97.7°F | Resp 16 | Ht 62.0 in | Wt 190.0 lb

## 2022-12-18 DIAGNOSIS — Z170 Estrogen receptor positive status [ER+]: Secondary | ICD-10-CM

## 2022-12-18 DIAGNOSIS — Z5112 Encounter for antineoplastic immunotherapy: Secondary | ICD-10-CM | POA: Diagnosis not present

## 2022-12-18 DIAGNOSIS — C50411 Malignant neoplasm of upper-outer quadrant of right female breast: Secondary | ICD-10-CM

## 2022-12-18 DIAGNOSIS — Z79899 Other long term (current) drug therapy: Secondary | ICD-10-CM | POA: Diagnosis not present

## 2022-12-18 DIAGNOSIS — Z95828 Presence of other vascular implants and grafts: Secondary | ICD-10-CM

## 2022-12-18 LAB — CBC WITH DIFFERENTIAL (CANCER CENTER ONLY)
Abs Immature Granulocytes: 0.01 10*3/uL (ref 0.00–0.07)
Basophils Absolute: 0 10*3/uL (ref 0.0–0.1)
Basophils Relative: 0 %
Eosinophils Absolute: 0.2 10*3/uL (ref 0.0–0.5)
Eosinophils Relative: 4 %
HCT: 32.6 % — ABNORMAL LOW (ref 36.0–46.0)
Hemoglobin: 10.7 g/dL — ABNORMAL LOW (ref 12.0–15.0)
Immature Granulocytes: 0 %
Lymphocytes Relative: 36 %
Lymphs Abs: 1.7 10*3/uL (ref 0.7–4.0)
MCH: 26.6 pg (ref 26.0–34.0)
MCHC: 32.8 g/dL (ref 30.0–36.0)
MCV: 80.9 fL (ref 80.0–100.0)
Monocytes Absolute: 0.3 10*3/uL (ref 0.1–1.0)
Monocytes Relative: 7 %
Neutro Abs: 2.5 10*3/uL (ref 1.7–7.7)
Neutrophils Relative %: 53 %
Platelet Count: 238 10*3/uL (ref 150–400)
RBC: 4.03 MIL/uL (ref 3.87–5.11)
RDW: 14.9 % (ref 11.5–15.5)
WBC Count: 4.8 10*3/uL (ref 4.0–10.5)
nRBC: 0 % (ref 0.0–0.2)

## 2022-12-18 LAB — CMP (CANCER CENTER ONLY)
ALT: 18 U/L (ref 0–44)
AST: 18 U/L (ref 15–41)
Albumin: 3.8 g/dL (ref 3.5–5.0)
Alkaline Phosphatase: 79 U/L (ref 38–126)
Anion gap: 3 — ABNORMAL LOW (ref 5–15)
BUN: 15 mg/dL (ref 6–20)
CO2: 29 mmol/L (ref 22–32)
Calcium: 9.2 mg/dL (ref 8.9–10.3)
Chloride: 108 mmol/L (ref 98–111)
Creatinine: 0.91 mg/dL (ref 0.44–1.00)
GFR, Estimated: >60 mL/min (ref >60)
Glucose, Bld: 152 mg/dL — ABNORMAL HIGH (ref 70–99)
Potassium: 3.6 mmol/L (ref 3.5–5.1)
Sodium: 140 mmol/L (ref 135–145)
Total Bilirubin: 0.3 mg/dL (ref 0.3–1.2)
Total Protein: 6.6 g/dL (ref 6.5–8.1)

## 2022-12-18 MED ORDER — HEPARIN SOD (PORK) LOCK FLUSH 100 UNIT/ML IV SOLN
500.0000 [IU] | Freq: Once | INTRAVENOUS | Status: AC | PRN
  Administered 2022-12-18: 500 [IU]

## 2022-12-18 MED ORDER — ACETAMINOPHEN 325 MG PO TABS
650.0000 mg | ORAL_TABLET | Freq: Once | ORAL | Status: AC
  Administered 2022-12-18: 650 mg via ORAL
  Filled 2022-12-18: qty 2

## 2022-12-18 MED ORDER — SODIUM CHLORIDE 0.9 % IV SOLN
420.0000 mg | Freq: Once | INTRAVENOUS | Status: AC
  Administered 2022-12-18: 420 mg via INTRAVENOUS
  Filled 2022-12-18: qty 14

## 2022-12-18 MED ORDER — TRASTUZUMAB-DKST CHEMO 150 MG IV SOLR
6.0000 mg/kg | Freq: Once | INTRAVENOUS | Status: AC
  Administered 2022-12-18: 504 mg via INTRAVENOUS
  Filled 2022-12-18: qty 24

## 2022-12-18 MED ORDER — SODIUM CHLORIDE 0.9 % IV SOLN: Freq: Once | INTRAVENOUS | Status: AC

## 2022-12-18 MED ORDER — SODIUM CHLORIDE 0.9% FLUSH
10.0000 mL | Freq: Once | INTRAVENOUS | Status: AC
  Administered 2022-12-18: 10 mL

## 2022-12-18 MED ORDER — SODIUM CHLORIDE 0.9% FLUSH
10.0000 mL | INTRAVENOUS | Status: DC | PRN
  Administered 2022-12-18: 10 mL

## 2022-12-18 NOTE — Assessment & Plan Note (Addendum)
Sharyn Lull is a 46 year old woman with history of stage Ib triple positive breast cancer diagnosed in January 2023 she is status post neoadjuvant chemotherapy followed by lumpectomy, maintenance trastuzumab which will complete next month, adjuvant radiation, and antiestrogen therapy with tamoxifen which began in August 2023.  Sherissa will proceed with Herceptin today.  She continues to tolerate it well.  She has two more doses (one today and one in three weeks).    She will also continue on Tamoxifen daily, she is tolerating this well and has no sign of breast cancer recurrence.  She will proceed with her mammogram in January as scheduled.    We will see Arelly back in about 3 months for f/u.  I sent a message to Dr. Donne Hazel and his nurse to get [facility] set up for port removal after her final Herceptin treatment on 01/08/2022.

## 2022-12-19 ENCOUNTER — Telehealth: Payer: Self-pay | Admitting: Adult Health

## 2022-12-19 NOTE — Telephone Encounter (Signed)
Scheduled appointment per 12/27 los. Patient is aware.

## 2022-12-20 ENCOUNTER — Other Ambulatory Visit: Payer: Self-pay

## 2022-12-23 ENCOUNTER — Encounter: Payer: Self-pay | Admitting: Hematology and Oncology

## 2023-01-01 ENCOUNTER — Encounter: Payer: Self-pay | Admitting: Hematology and Oncology

## 2023-01-07 ENCOUNTER — Ambulatory Visit
Admission: RE | Admit: 2023-01-07 | Discharge: 2023-01-07 | Disposition: A | Discharge status: Home / Self Care | Payer: 59 | Source: Ambulatory Visit | Attending: Adult Health | Admitting: Adult Health

## 2023-01-07 DIAGNOSIS — R922 Inconclusive mammogram: Secondary | ICD-10-CM | POA: Diagnosis not present

## 2023-01-07 DIAGNOSIS — Z853 Personal history of malignant neoplasm of breast: Secondary | ICD-10-CM | POA: Diagnosis not present

## 2023-01-07 DIAGNOSIS — Z170 Estrogen receptor positive status [ER+]: Secondary | ICD-10-CM

## 2023-01-08 ENCOUNTER — Inpatient Hospital Stay: Payer: 59

## 2023-01-08 ENCOUNTER — Inpatient Hospital Stay: Payer: 59 | Attending: Hematology and Oncology

## 2023-01-08 ENCOUNTER — Other Ambulatory Visit: Payer: Self-pay

## 2023-01-08 VITALS — BP 119/65 | HR 80 | Temp 98.9°F | Resp 16 | Wt 190.2 lb

## 2023-01-08 DIAGNOSIS — C50411 Malignant neoplasm of upper-outer quadrant of right female breast: Secondary | ICD-10-CM | POA: Diagnosis not present

## 2023-01-08 DIAGNOSIS — Z95828 Presence of other vascular implants and grafts: Secondary | ICD-10-CM

## 2023-01-08 DIAGNOSIS — Z79899 Other long term (current) drug therapy: Secondary | ICD-10-CM | POA: Insufficient documentation

## 2023-01-08 DIAGNOSIS — Z5111 Encounter for antineoplastic chemotherapy: Secondary | ICD-10-CM | POA: Diagnosis not present

## 2023-01-08 DIAGNOSIS — Z5112 Encounter for antineoplastic immunotherapy: Secondary | ICD-10-CM | POA: Diagnosis present

## 2023-01-08 LAB — CBC WITH DIFFERENTIAL (CANCER CENTER ONLY)
Abs Immature Granulocytes: 0.01 10*3/uL (ref 0.00–0.07)
Basophils Absolute: 0 10*3/uL (ref 0.0–0.1)
Basophils Relative: 0 %
Eosinophils Absolute: 0.1 10*3/uL (ref 0.0–0.5)
Eosinophils Relative: 3 %
HCT: 33.4 % — ABNORMAL LOW (ref 36.0–46.0)
Hemoglobin: 10.9 g/dL — ABNORMAL LOW (ref 12.0–15.0)
Immature Granulocytes: 0 %
Lymphocytes Relative: 35 %
Lymphs Abs: 1.7 10*3/uL (ref 0.7–4.0)
MCH: 26.7 pg (ref 26.0–34.0)
MCHC: 32.6 g/dL (ref 30.0–36.0)
MCV: 81.7 fL (ref 80.0–100.0)
Monocytes Absolute: 0.4 10*3/uL (ref 0.1–1.0)
Monocytes Relative: 8 %
Neutro Abs: 2.6 10*3/uL (ref 1.7–7.7)
Neutrophils Relative %: 54 %
Platelet Count: 247 10*3/uL (ref 150–400)
RBC: 4.09 MIL/uL (ref 3.87–5.11)
RDW: 14.8 % (ref 11.5–15.5)
WBC Count: 4.8 10*3/uL (ref 4.0–10.5)
nRBC: 0 % (ref 0.0–0.2)

## 2023-01-08 LAB — CMP (CANCER CENTER ONLY)
ALT: 19 U/L (ref 0–44)
AST: 16 U/L (ref 15–41)
Albumin: 3.7 g/dL (ref 3.5–5.0)
Alkaline Phosphatase: 83 U/L (ref 38–126)
Anion gap: 4 — ABNORMAL LOW (ref 5–15)
BUN: 15 mg/dL (ref 6–20)
CO2: 28 mmol/L (ref 22–32)
Calcium: 9 mg/dL (ref 8.9–10.3)
Chloride: 108 mmol/L (ref 98–111)
Creatinine: 0.87 mg/dL (ref 0.44–1.00)
GFR, Estimated: >60 mL/min (ref >60)
Glucose, Bld: 133 mg/dL — ABNORMAL HIGH (ref 70–99)
Potassium: 3.9 mmol/L (ref 3.5–5.1)
Sodium: 140 mmol/L (ref 135–145)
Total Bilirubin: 0.2 mg/dL — ABNORMAL LOW (ref 0.3–1.2)
Total Protein: 6.3 g/dL — ABNORMAL LOW (ref 6.5–8.1)

## 2023-01-08 MED ORDER — ACETAMINOPHEN 325 MG PO TABS
650.0000 mg | ORAL_TABLET | Freq: Once | ORAL | Status: AC
  Administered 2023-01-08: 650 mg via ORAL
  Filled 2023-01-08: qty 2

## 2023-01-08 MED ORDER — SODIUM CHLORIDE 0.9 % IV SOLN: Freq: Once | INTRAVENOUS | Status: AC

## 2023-01-08 MED ORDER — TRASTUZUMAB-DKST CHEMO 150 MG IV SOLR
6.0000 mg/kg | Freq: Once | INTRAVENOUS | Status: AC
  Administered 2023-01-08: 504 mg via INTRAVENOUS
  Filled 2023-01-08: qty 24

## 2023-01-08 MED ORDER — SODIUM CHLORIDE 0.9% FLUSH
10.0000 mL | INTRAVENOUS | Status: DC | PRN
  Administered 2023-01-08: 10 mL

## 2023-01-08 MED ORDER — SODIUM CHLORIDE 0.9 % IV SOLN
420.0000 mg | Freq: Once | INTRAVENOUS | Status: AC
  Administered 2023-01-08: 420 mg via INTRAVENOUS
  Filled 2023-01-08: qty 14

## 2023-01-08 MED ORDER — HEPARIN SOD (PORK) LOCK FLUSH 100 UNIT/ML IV SOLN
500.0000 [IU] | Freq: Once | INTRAVENOUS | Status: AC | PRN
  Administered 2023-01-08: 500 [IU]

## 2023-01-08 MED ORDER — SODIUM CHLORIDE 0.9% FLUSH
10.0000 mL | Freq: Once | INTRAVENOUS | Status: AC
  Administered 2023-01-08: 10 mL

## 2023-01-08 NOTE — Progress Notes (Signed)
Patient tolerated her perjeta well- as always. Patient stable at discharge and ambulatory to the lobby. Declined her 30 minute observation. VSS- BP 119/65 (BP Location: Left Arm, Patient Position: Sitting)   Pulse 80   Temp 98.9 F (37.2 C) (Oral)   Resp 16   Wt 190 lb 4 oz (86.3 kg)   SpO2 100%   BMI 34.80 kg/m

## 2023-01-08 NOTE — Patient Instructions (Signed)
[facility]  Discharge Instructions: Thank you for choosing [facility] to provide your oncology and hematology care.   If you have a lab appointment with the [facility], please go directly to the [facility] and check in at the registration area.   Wear comfortable clothing and clothing appropriate for easy access to any Portacath or PICC line.   We strive to give you quality time with your provider. You may need to reschedule your appointment if you arrive late (15 or more minutes).  Arriving late affects you and other patients whose appointments are after yours.  Also, if you miss three or more appointments without notifying the office, you may be dismissed from the clinic at the provider's discretion.      For prescription refill requests, have your pharmacy contact our office and allow 72 hours for refills to be completed.    Today you received the following chemotherapy and/or immunotherapy agents: Trastuzumab, Pertuzumab.       To help prevent nausea and vomiting after your treatment, we encourage you to take your nausea medication as directed.  BELOW ARE SYMPTOMS THAT SHOULD BE REPORTED IMMEDIATELY: *FEVER GREATER THAN 100.4 F (38 C) OR HIGHER *CHILLS OR SWEATING *NAUSEA AND VOMITING THAT IS NOT CONTROLLED WITH YOUR NAUSEA MEDICATION *UNUSUAL SHORTNESS OF BREATH *UNUSUAL BRUISING OR BLEEDING *URINARY PROBLEMS (pain or burning when urinating, or frequent urination) *BOWEL PROBLEMS (unusual diarrhea, constipation, pain near the anus) TENDERNESS IN MOUTH AND THROAT WITH OR WITHOUT PRESENCE OF ULCERS (sore throat, sores in mouth, or a toothache) UNUSUAL RASH, SWELLING OR PAIN  UNUSUAL VAGINAL DISCHARGE OR ITCHING   Items with * indicate a potential emergency and should be followed up as soon as possible or go to the Emergency Department if any problems should occur.  Please show the CHEMOTHERAPY ALERT CARD or IMMUNOTHERAPY ALERT CARD  at check-in to the Emergency Department and triage nurse.  Should you have questions after your visit or need to cancel or reschedule your appointment, please contact [facility]  Dept: [phone]  and follow the prompts.  Office hours are 8:00 a.m. to 4:30 p.m. Monday - Friday. Please note that voicemails left after 4:00 p.m. may not be returned until the following business day.  We are closed weekends and major holidays. You have access to a nurse at all times for urgent questions. Please call the main number to the clinic Dept: [phone] and follow the prompts.   For any non-urgent questions, you may also contact your provider using MyChart. We now offer e-Visits for anyone 49 and older to request care online for non-urgent symptoms. For details visit [email or web address].   Also download the MyChart app! Go to the app store, search "MyChart", open the app, select [facility], and log in with your MyChart username and password.  Masks are optional in the cancer centers. If you would like for your care team to wear a mask while they are taking care of you, please let them know. You may have one support person who is at least 49 years old accompany you for your appointments.

## 2023-01-10 ENCOUNTER — Encounter: Payer: Self-pay | Admitting: *Deleted

## 2023-01-10 DIAGNOSIS — C50411 Malignant neoplasm of upper-outer quadrant of right female breast: Secondary | ICD-10-CM

## 2023-01-13 ENCOUNTER — Ambulatory Visit (HOSPITAL_COMMUNITY)
Admission: RE | Admit: 2023-01-13 | Discharge: 2023-01-13 | Disposition: A | Discharge status: Home / Self Care | Payer: 59 | Source: Ambulatory Visit | Attending: Internal Medicine | Admitting: Internal Medicine

## 2023-01-13 ENCOUNTER — Ambulatory Visit: Payer: 59 | Attending: General Surgery

## 2023-01-13 ENCOUNTER — Encounter (HOSPITAL_COMMUNITY): Payer: Self-pay | Admitting: Internal Medicine

## 2023-01-13 ENCOUNTER — Ambulatory Visit (HOSPITAL_BASED_OUTPATIENT_CLINIC_OR_DEPARTMENT_OTHER)
Admission: RE | Admit: 2023-01-13 | Discharge: 2023-01-13 | Disposition: A | Discharge status: Home / Self Care | Payer: 59 | Source: Ambulatory Visit | Attending: Internal Medicine | Admitting: Internal Medicine

## 2023-01-13 VITALS — BP 108/78 | HR 77 | Wt 191.0 lb

## 2023-01-13 VITALS — Wt 190.2 lb

## 2023-01-13 DIAGNOSIS — Z170 Estrogen receptor positive status [ER+]: Secondary | ICD-10-CM

## 2023-01-13 DIAGNOSIS — I428 Other cardiomyopathies: Secondary | ICD-10-CM

## 2023-01-13 DIAGNOSIS — Z5111 Encounter for antineoplastic chemotherapy: Secondary | ICD-10-CM | POA: Diagnosis not present

## 2023-01-13 DIAGNOSIS — Z483 Aftercare following surgery for neoplasm: Secondary | ICD-10-CM

## 2023-01-13 DIAGNOSIS — T451X5A Adverse effect of antineoplastic and immunosuppressive drugs, initial encounter: Secondary | ICD-10-CM

## 2023-01-13 DIAGNOSIS — Z9221 Personal history of antineoplastic chemotherapy: Secondary | ICD-10-CM | POA: Diagnosis not present

## 2023-01-13 DIAGNOSIS — I959 Hypotension, unspecified: Secondary | ICD-10-CM | POA: Diagnosis not present

## 2023-01-13 DIAGNOSIS — I427 Cardiomyopathy due to drug and external agent: Secondary | ICD-10-CM

## 2023-01-13 DIAGNOSIS — C50411 Malignant neoplasm of upper-outer quadrant of right female breast: Secondary | ICD-10-CM | POA: Diagnosis not present

## 2023-01-13 DIAGNOSIS — Z0189 Encounter for other specified special examinations: Secondary | ICD-10-CM | POA: Diagnosis not present

## 2023-01-13 LAB — ECHOCARDIOGRAM COMPLETE
Area-P 1/2: 4.01 cm2
S' Lateral: 2.7 cm
Weight: 3044 oz

## 2023-01-13 NOTE — Therapy (Signed)
  OUTPATIENT PHYSICAL THERAPY SOZO SCREENING NOTE   Patient Name: Tonya Blair MRN: [id number] DOB:04-25-1976, 47 y.o., female Today's Date: 01/13/2023  PCP: Merryl Hacker, No REFERRING PROVIDER: Rolm Bookbinder, MD   PT End of Session - 01/13/23 [phone]     Visit Number 2   # unchanged due to screen only   PT Start Time 0827    PT Stop Time 0832    PT Time Calculation (min) 5 min    Activity Tolerance Patient tolerated treatment well    Behavior During Therapy [facility] for tasks assessed/performed             Past Medical History:  Diagnosis Date   Anemia    IDA   Cancer ([place])    right breast cancer   Family history of prostate cancer 01/02/2022   Past Surgical History:  Procedure Laterality Date   BREAST BIOPSY Right 12/25/2021   BREAST BIOPSY Left    BREAST LUMPECTOMY Right 06/19/2022   BREAST LUMPECTOMY WITH RADIOACTIVE SEED AND SENTINEL LYMPH NODE BIOPSY Right 06/19/2022   Procedure: RADIOACTIVE SEED GUIDED RIGHT BREAST LUMPECTOMY, RIGHT AXILLARY SENTINEL LYMPH NODE BIOPSY;  Surgeon: Rolm Bookbinder, MD;  Location: [facility];  Service: General;  Laterality: Right;   PORTACATH PLACEMENT N/A 01/14/2022   Procedure: INSERTION PORT-A-CATH;  Surgeon: Rolm Bookbinder, MD;  Location: [facility];  Service: General;  Laterality: N/A;   THERAPEUTIC ABORTION     WISDOM TOOTH EXTRACTION     Patient Active Problem List   Diagnosis Date Noted   Port-A-Cath in place 04/10/2022   Anemia due to antineoplastic chemotherapy 04/10/2022   Chemotherapy induced diarrhea 03/19/2022   Genetic testing 01/18/2022   Family history of prostate cancer 01/02/2022   Malignant neoplasm of upper-outer quadrant of right breast in female, estrogen receptor positive ([place]) 12/31/2021   Cervical intraepithelial neoplasia grade 2 10/28/2019    REFERRING DIAG: right breast cancer at risk for lymphedema  THERAPY DIAG: Aftercare following surgery for neoplasm  PERTINENT HISTORY: Completed  neoadjuvant chemotherapy TCHP May 2023. Rt lumpectomy 06/19/22 showing no signs of carcinoma. 4 negative lymph node. Will be having radiation Thursday.   PRECAUTIONS: right UE Lymphedema risk, None  SUBJECTIVE: Pt returns for her 3 month L-Dex screen.  "The ABC class was very helpful to my workout regimen. I've scaled back my arm workouts from daily to 2x/wk and that has helped calmed down the swelling I had been feeling in my axilla."   PAIN:  Are you having pain? No  SOZO SCREENING: Patient was assessed today using the SOZO machine to determine the lymphedema index score. This was compared to her baseline score. It was determined that she is within the recommended range when compared to her baseline and no further action is needed at this time. She will continue SOZO screenings. These are done every 3 months for 2 years post operatively followed by every 6 months for 2 years, and then annually.   L-DEX FLOWSHEETS - 01/13/23 0800       L-DEX LYMPHEDEMA SCREENING   Measurement Type Unilateral    L-DEX MEASUREMENT EXTREMITY Upper Extremity    POSITION  Standing    DOMINANT SIDE Right    At Risk Side Right    BASELINE SCORE (UNILATERAL) 1.7    L-DEX SCORE (UNILATERAL) -0.5    VALUE CHANGE (UNILAT) -2.2               Otelia Limes, PTA 01/13/2023, 8:32 AM

## 2023-01-13 NOTE — Patient Instructions (Signed)
No medication changes today. Return to see Dr. Haroldine Laws in two months with echocardiogram.

## 2023-01-13 NOTE — Progress Notes (Signed)
CARDIO-ONCOLOGY CLINIC NOTE  Referring Physician: Dr. Chryl Heck Primary Care: Pcp, No Primary Cardiologist: New  HPI:  Tonya Blair is a 47 y.o. female with right  breast cancer referred by Dr. Chryl Heck for enrollment into the Cardio-Oncology program.   Oncology History  Malignant neoplasm of upper-outer quadrant of right breast in female, estrogen receptor positive ([place])  12/31/2021 Initial Diagnosis    Malignant neoplasm of upper-outer quadrant of right breast in female, estrogen receptor positive ([place])      01/02/2022 Cancer Staging    Staging form: Breast, AJCC 8th Edition - Clinical stage from 01/02/2022: Stage IB (cT2, cN0, cM0, G3, ER+, PR+, HER2+) - Signed by Benay Pike, MD on 01/02/2022 Histologic grading system: 3 grade system      01/14/2022 Genetic Testing    Negative hereditary cancer genetic testing: no pathogenic variants detected in [facility] CustomNext-Cancer +RNAinsight Panel.  The report date is January 14, 2022.   The CustomNext-Cancer+RNAinsight panel offered by [facility] includes sequencing and rearrangement analysis for the following 47 genes:  APC, ATM, AXIN2, BARD1, BMPR1A, BRCA1, BRCA2, BRIP1, CDH1, CDK4, CDKN2A, CHEK2, DICER1, EPCAM, GREM1, HOXB13, MEN1, MLH1, MSH2, MSH3, MSH6, MUTYH, NBN, NF1, NF2, NTHL1, PALB2, PMS2, POLD1, POLE, PTEN, RAD51C, RAD51D, RECQL, RET, SDHA, SDHAF2, SDHB, SDHC, SDHD, SMAD4, SMARCA4, STK11, TP53, TSC1, TSC2, and VHL.  RNA data is routinely analyzed for use in variant interpretation for all genes.    01/16/2022 -  Chemotherapy    Patient is on Treatment Plan : BREAST  Docetaxel + Carboplatin + Trastuzumab + Pertuzumab  (TCHP) q21d            ONCOLOGIC HISTORY: 1) 12/11/2021: Diagnostic mammogram and ultrasound showed suspicious mass in the 9 o'clock region of the right breast. No evidence of enlarged adenopathy.   2) 12/25/2021: Underwent US guided right breast core needle biopsy of breast mass at 9 o'clock. 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  Prognostics from this mass showed ER 70% moderate, PR 20% moderate, HER2 positive by IHC, Ki-67 of 30%.   3) 01/09/2022:  MR Breast Bilateral: Biopsy proven malignancy in the outer right breast with associated linear/nodular enhancement extending anteriorly altogether measuring up to 5.1 cm. Indeterminate 0.6 cm tubular enhancing mass in the outer left breast.   4) 01/16/2022: Initiated neoadjuvant therapy with TCHP q 21 days given T2 tumor with HER2 amplification.    5) 01/17/2022: Underwent MRI guided biopsy of indeterminate 6 mm enhancing mass over the left outer lower quadrant. 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 No evidence of malignancy.    6) Completed neoadjuvant chemotherapy TCHP from 01/16/2022 to 05/01/2022  Got H/P on 5/30 and further dosing stopped due to reduced EF on echo . We saw her for the first time in 7/23 and Herceptin/Perjeta restarted. Toprol and losartan started. She is here for f/u echo.   Here for routine f/u. Finished Herceptin/Perjeta on 01/08/23. Feels great. Exercising. No SOB, edema. BP ok.   Echo today 01/13/23 EF 50-55% GLS -20.2%    ECHO: 12/29/21 EF 60-65% GLS -22.5% 03/29/22 EF 60-65% GLS -19.7 % (underestimated on 3-chamber)  06/26/22 EF 53% GLS -18.8% Echo 08/12/22 EF 55-60% GLS -24.3%   Past Medical History:  Diagnosis Date   Anemia    IDA   Cancer ([place])    right breast cancer   Family history of prostate cancer 01/02/2022    Current Outpatient Medications  Medication Sig Dispense  Refill   Ascorbic Acid (VITAMIN C PO) Take 1 tablet by mouth in the morning.     BIOTIN PO Take 1 tablet by mouth in the morning.     Cholecalciferol (VITAMIN D3 PO) Take 1 tablet by mouth in the morning.     COD LIVER OIL PO Take by mouth.     ibuprofen (ADVIL) 200 MG tablet Take 200 mg by mouth every 8 (eight) hours as needed (pain.).      lidocaine-prilocaine (EMLA) cream Apply to affected area once 30 g 3   Multiple Minerals-Vitamins (CAL MAG ZINC +D3 PO) Take 1 tablet by mouth in the morning.     OVER THE COUNTER MEDICATION Take 1 capsule by mouth in the morning. Sea Moss Superfood Capsule     oxyCODONE (OXY IR/ROXICODONE) 5 MG immediate release tablet Take 1 tablet (5 mg total) by mouth every 6 (six) hours as needed. 10 tablet 0   paragard intrauterine copper IUD IUD 1 each by Intrauterine route once.     tamoxifen (NOLVADEX) 20 MG tablet Take 1 tablet (20 mg total) by mouth daily. 90 tablet 3   traMADol (ULTRAM) 50 MG tablet Take 2 tablets (100 mg total) by mouth every 6 (six) hours as needed. 10 tablet 0   Turmeric (QC TUMERIC COMPLEX PO) Take by mouth.     No current facility-administered medications for this encounter.    Allergies  Allergen Reactions   Codeine Nausea And Vomiting   Kiwi Extract Itching and Swelling      Social History   Socioeconomic History   Marital status: Single    Spouse name: Not on file   Number of children: Not on file   Years of education: Not on file   Highest education level: Not on file  Occupational History   Not on file  Tobacco Use   Smoking status: Never   Smokeless tobacco: Never  Vaping Use   Vaping Use: Never used  Substance and Sexual Activity   Alcohol use: Yes    Comment: occasional wine   Drug use: Never   Sexual activity: Yes    Birth control/protection: I.U.D.    Comment: Mirena IUD  Other Topics Concern   Not on file  Social History Narrative   Not on file   Social Determinants of Health   Financial Resource Strain: Low Risk  (01/02/2022)   Overall Financial Resource Strain (CARDIA)    Difficulty of Paying Living Expenses: Not hard at all  Food Insecurity: No Food Insecurity (01/02/2022)   Hunger Vital Sign    Worried About Running Out of Food in the Last Year: Never true    Ran Out of Food in the Last Year: Never true  Transportation Needs: No  Transportation Needs (01/02/2022)   PRAPARE - Hydrologist (Medical): No    Lack of Transportation (Non-Medical): No  Physical Activity: Not on file  Stress: Not on file  Social Connections: Not on file  Intimate Partner Violence: Not on file      Family History  Problem Relation Age of Onset   Prostate cancer Father 44    Vitals:   01/13/23 1535  BP: 108/78  Pulse: 77  SpO2: 99%  Weight: 86.6 kg (191 lb)     PHYSICAL EXAM: General:  Well appearing. No resp difficulty HEENT: normal Neck: supple. no JVD. Carotids 2+ bilat; no bruits. No lymphadenopathy or thryomegaly appreciated. Cor: PMI nondisplaced. Regular rate & rhythm. No rubs,  gallops or murmurs. Lungs: clear Abdomen: soft, nontender, nondistended. No hepatosplenomegaly. No bruits or masses. Good bowel sounds. Extremities: no cyanosis, clubbing, rash, edema Neuro: alert & orientedx3, cranial nerves grossly intact. moves all 4 extremities w/o difficulty. Affect pleasant  ASSESSMENT & PLAN:  1. 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 Diagnosed 1/23  - Completed neoadjuvant chemotherapy TCHP from 01/16/2022 to 05/01/2022 - Scheduled for H/P q21 days. Received 1 dose 05/21/22 -> stopped due to decreased EF - Echo 12/29/21 EF 60-65% GLS -22.5% - Echo 03/29/22 EF 60-65% GLS -19.7 % (underestimated on 3-chamber)  - Echo 06/26/22 EF 53% GLS -18.8% - Echo  08/12/22 EF 55-60% GLS -24.3% - Echo today 01/13/23 EF 50-55% GLS -20.2%  - I suspect she had mild herceptin/perjeta cardiotoxicity. Now improved - Struggling with low BP on losartan 12.5 and Toprol 12.5 (decreased at last visit due to low BP) - Finished Herceptin/Perjeta on 01/08/23.  - EF slightly down today on the lower end of normal. Will have her continue Toprol and Losartan x 1 month. Repeat echo in 2 months.   Tonya Bickers, MD  4:16 PM

## 2023-01-13 NOTE — Progress Notes (Signed)
  Echocardiogram 2D Echocardiogram has been performed.  Tonya Blair M 01/13/2023, 2:38 PM

## 2023-01-17 DIAGNOSIS — Z452 Encounter for adjustment and management of vascular access device: Secondary | ICD-10-CM | POA: Diagnosis not present

## 2023-01-25 ENCOUNTER — Other Ambulatory Visit (HOSPITAL_COMMUNITY): Payer: Self-pay | Admitting: Internal Medicine

## 2023-01-29 ENCOUNTER — Encounter: Payer: Self-pay | Admitting: Hematology and Oncology

## 2023-02-03 ENCOUNTER — Encounter: Payer: Self-pay | Admitting: Hematology and Oncology

## 2023-02-10 ENCOUNTER — Encounter: Payer: Self-pay | Admitting: Gastroenterology

## 2023-03-06 ENCOUNTER — Encounter: Payer: Self-pay | Admitting: Hematology and Oncology

## 2023-03-13 ENCOUNTER — Encounter: Payer: Self-pay | Admitting: Hematology and Oncology

## 2023-03-13 ENCOUNTER — Other Ambulatory Visit: Payer: Self-pay

## 2023-03-13 ENCOUNTER — Inpatient Hospital Stay: Payer: 59 | Attending: Hematology and Oncology | Admitting: Hematology and Oncology

## 2023-03-13 VITALS — BP 126/90 | HR 91 | Temp 97.6°F | Resp 16 | Wt 194.4 lb

## 2023-03-13 DIAGNOSIS — Z79810 Long term (current) use of selective estrogen receptor modulators (SERMs): Secondary | ICD-10-CM | POA: Insufficient documentation

## 2023-03-13 DIAGNOSIS — C50411 Malignant neoplasm of upper-outer quadrant of right female breast: Secondary | ICD-10-CM | POA: Diagnosis not present

## 2023-03-13 DIAGNOSIS — Z170 Estrogen receptor positive status [ER+]: Secondary | ICD-10-CM | POA: Diagnosis not present

## 2023-03-13 NOTE — Assessment & Plan Note (Signed)
#  Right breast invasive ductal carcinoma  --Biopsy proved on 12/25/2021. Pathology showed invasive ductal carcinoma grade 3, ductal carcinoma in situ and calcifications, 5 cm from the nipple.  Prognostics from this mass showed ER 70% moderate, PR 20% moderate, HER2 positive by IHC, Ki-67 of 30%. --Recommendation is neoadjuvant TCHP q 21 days given T2 tumor with HER2 amplification. -- She completed neoadjuvant chemo, tolerated it very well -- Clinically and radiologically complete response -- She had lumpectomy and sentinel lymph nodes, complete pathologic response.  She completed adjuvant Herceptin and pertuzumab, had a slight drop in EF and now followed by cardio oncology. --She is now on adjuvant tamoxifen and is here for follow-up.  No concerning review of systems or physical exam findings. Last mammogram in January 2024 with no concerns for malignancy.  At this time she will return to clinic in 6 months or sooner as needed.

## 2023-03-13 NOTE — Progress Notes (Signed)
[facility]  PROGRESS NOTE  Patient Care Team: Pcp, No as PCP - General Rolm Bookbinder, MD as Consulting Physician (General Surgery) Benay Pike, MD as Consulting Physician (Hematology and Oncology) Eppie Gibson, MD as Attending Physician (Radiation Oncology)  CHIEF COMPLAINTS:  Right breast invasive ductal carcinoma.  Oncology History  Malignant neoplasm of upper-outer quadrant of right breast in female, estrogen receptor positive ([place])  12/31/2021 Initial Diagnosis   Malignant neoplasm of upper-outer quadrant of right breast in female, estrogen receptor positive ([place])   01/02/2022 Cancer Staging   Staging form: Breast, AJCC 8th Edition - Clinical stage from 01/02/2022: Stage IB (cT2, cN0, cM0, G3, ER+, PR+, HER2+) - Signed by Benay Pike, MD on 01/02/2022 Histologic grading system: 3 grade system   01/14/2022 Genetic Testing   Negative hereditary cancer genetic testing: no pathogenic variants detected in [facility] CustomNext-Cancer +RNAinsight Panel.  The report date is January 14, 2022.  The CustomNext-Cancer+RNAinsight panel offered by [facility] includes sequencing and rearrangement analysis for the following 47 genes:  APC, ATM, AXIN2, BARD1, BMPR1A, BRCA1, BRCA2, BRIP1, CDH1, CDK4, CDKN2A, CHEK2, DICER1, EPCAM, GREM1, HOXB13, MEN1, MLH1, MSH2, MSH3, MSH6, MUTYH, NBN, NF1, NF2, NTHL1, PALB2, PMS2, POLD1, POLE, PTEN, RAD51C, RAD51D, RECQL, RET, SDHA, SDHAF2, SDHB, SDHC, SDHD, SMAD4, SMARCA4, STK11, TP53, TSC1, TSC2, and VHL.  RNA data is routinely analyzed for use in variant interpretation for all genes.   01/16/2022 - 08/14/2022 Chemotherapy   Completed chemotherapy May 2023, received 6 cycles of neoadjuvant TCHP Patient is on Treatment Plan : BREAST  Docetaxel + Carboplatin + Trastuzumab + Pertuzumab  (TCHP) q21d       01/16/2022 -  Chemotherapy   Patient is on Treatment Plan : BREAST  Docetaxel + Carboplatin + Trastuzumab + Pertuzumab  (TCHP) q21d /  Trastuzumab + Pertuzumab q21d     06/19/2022 Definitive Surgery   Right breast lumpectomy showed fibrosis with mild inflammation, negative for residual carcinoma, all surgical margins negative for carcinoma, right axillary sentinel lymph node negative for metastatic carcinoma, final pathologic staging [id number]   07/18/2022 - 08/09/2022 Radiation Therapy   Site Technique Total Dose (Gy) Dose per Fx (Gy) Completed Fx Beam Energies  Breast, Right: Breast_R 3D 42.56/42.56 2.66 16/16 10X, 6XFFF     07/2022 -  Anti-estrogen oral therapy   Tamoxifen    Interval history   Tonya Blair 47 y.o. female is here after definitive surgery. She had complete pathologic response to neoadjuvant chemotherapy.   She completed maintenance with Herceptin and pertuzumab Most recent echo with EF of 51%, low normal function, followed by Dr Haroldine Laws.  According to Dr. Haroldine Laws she will continue on Toprol and losartan for a month and will repeat echo in 2 months and follow-up with them. Last mammogram in January 2024 with no mammographic evidence of malignancy in either breast.  Interval right breast posttreatment changes. Rest of the pertinent 10 point ROS reviewed and negative.  MEDICAL HISTORY:  Past Medical History:  Diagnosis Date   Anemia    IDA   Cancer ([place])    right breast cancer   Family history of prostate cancer 01/02/2022    SURGICAL HISTORY: Past Surgical History:  Procedure Laterality Date   BREAST BIOPSY Right 12/25/2021   BREAST BIOPSY Left    BREAST LUMPECTOMY Right 06/19/2022   BREAST LUMPECTOMY WITH RADIOACTIVE SEED AND SENTINEL LYMPH NODE BIOPSY Right 06/19/2022   Procedure: RADIOACTIVE SEED GUIDED RIGHT BREAST LUMPECTOMY, RIGHT AXILLARY SENTINEL LYMPH NODE BIOPSY;  Surgeon: Rolm Bookbinder,  MD;  Location: [facility];  Service: General;  Laterality: Right;   PORTACATH PLACEMENT N/A 01/14/2022   Procedure: INSERTION PORT-A-CATH;  Surgeon: Rolm Bookbinder, MD;   Location: [facility];  Service: General;  Laterality: N/A;   THERAPEUTIC ABORTION     WISDOM TOOTH EXTRACTION      SOCIAL HISTORY: Social History   Socioeconomic History   Marital status: Single    Spouse name: Not on file   Number of children: Not on file   Years of education: Not on file   Highest education level: Not on file  Occupational History   Not on file  Tobacco Use   Smoking status: Never   Smokeless tobacco: Never  Vaping Use   Vaping Use: Never used  Substance and Sexual Activity   Alcohol use: Yes    Comment: occasional wine   Drug use: Never   Sexual activity: Yes    Birth control/protection: I.U.D.    Comment: Mirena IUD  Other Topics Concern   Not on file  Social History Narrative   Not on file   Social Determinants of Health   Financial Resource Strain: Low Risk  (01/02/2022)   Overall Financial Resource Strain (CARDIA)    Difficulty of Paying Living Expenses: Not hard at all  Food Insecurity: No Food Insecurity (01/02/2022)   Hunger Vital Sign    Worried About Running Out of Food in the Last Year: Never true    Ran Out of Food in the Last Year: Never true  Transportation Needs: No Transportation Needs (01/02/2022)   PRAPARE - Hydrologist (Medical): No    Lack of Transportation (Non-Medical): No  Physical Activity: Not on file  Stress: Not on file  Social Connections: Not on file  Intimate Partner Violence: Not on file    FAMILY HISTORY: Family History  Problem Relation Age of Onset   Prostate cancer Father 65    ALLERGIES:  is allergic to codeine and kiwi extract.  MEDICATIONS:  Current Outpatient Medications  Medication Sig Dispense Refill   Ascorbic Acid (VITAMIN C PO) Take 1 tablet by mouth in the morning.     BIOTIN PO Take 1 tablet by mouth in the morning.     Cholecalciferol (VITAMIN D3 PO) Take 1 tablet by mouth in the morning.     COD LIVER OIL PO Take by mouth.     ibuprofen (ADVIL) 200 MG tablet Take  200 mg by mouth every 8 (eight) hours as needed (pain.).     lidocaine-prilocaine (EMLA) cream Apply to affected area once 30 g 3   losartan (COZAAR) 25 MG tablet TAKE 1/2 TABLET BY MOUTH AT BEDTIME 45 tablet 1   metoprolol succinate (TOPROL-XL) 25 MG 24 hr tablet TAKE 1/2 TABLET BY MOUTH AT BEDTIME 45 tablet 1   Multiple Minerals-Vitamins (CAL MAG ZINC +D3 PO) Take 1 tablet by mouth in the morning.     OVER THE COUNTER MEDICATION Take 1 capsule by mouth in the morning. Sea Moss Superfood Capsule     oxyCODONE (OXY IR/ROXICODONE) 5 MG immediate release tablet Take 1 tablet (5 mg total) by mouth every 6 (six) hours as needed. 10 tablet 0   paragard intrauterine copper IUD IUD 1 each by Intrauterine route once.     tamoxifen (NOLVADEX) 20 MG tablet Take 1 tablet (20 mg total) by mouth daily. 90 tablet 3   traMADol (ULTRAM) 50 MG tablet Take 2 tablets (100 mg total) by mouth  every 6 (six) hours as needed. 10 tablet 0   Turmeric (QC TUMERIC COMPLEX PO) Take by mouth.     No current facility-administered medications for this visit.    PHYSICAL EXAMINATION: ECOG PERFORMANCE STATUS: 1 - Symptomatic but completely ambulatory  There were no vitals filed for this visit.   There were no vitals filed for this visit.  Physical Exam Constitutional:      Appearance: Normal appearance.  Chest:     Comments: Bilateral breasts inspected and palpated.  No regional adenopathy Musculoskeletal:        General: No swelling.     Cervical back: Normal range of motion and neck supple. No rigidity.  Lymphadenopathy:     Cervical: No cervical adenopathy.  Neurological:     Mental Status: She is alert.      LABORATORY DATA:  I have reviewed the data as listed Lab Results  Component Value Date   WBC 4.8 01/08/2023   HGB 10.9 (L) 01/08/2023   HCT 33.4 (L) 01/08/2023   MCV 81.7 01/08/2023   PLT 247 01/08/2023   Lab Results  Component Value Date   NA 140 01/08/2023   K 3.9 01/08/2023   CL 108  01/08/2023   CO2 28 01/08/2023    RADIOGRAPHIC STUDIES: I have personally reviewed the radiological reports and agreed with the findings in the report.  ASSESSMENT AND PLAN:  Tonya Blair is a 47 y.o. female who presents for a follow up for right breast invasive ductal carcinoma.   Malignant neoplasm of upper-outer quadrant of right breast in female, estrogen receptor positive ([place])  #Right breast invasive ductal carcinoma  --Biopsy proved on 12/25/2021. Pathology showed invasive ductal carcinoma grade 3, ductal carcinoma in situ and calcifications, 5 cm from the nipple.  Prognostics from this mass showed ER 70% moderate, PR 20% moderate, HER2 positive by IHC, Ki-67 of 30%. --Recommendation is neoadjuvant TCHP q 21 days given T2 tumor with HER2 amplification. -- She completed neoadjuvant chemo, tolerated it very well -- Clinically and radiologically complete response -- She had lumpectomy and sentinel lymph nodes, complete pathologic response.  She completed adjuvant Herceptin and pertuzumab, had a slight drop in EF and now followed by cardio oncology. --She is now on adjuvant tamoxifen and is here for follow-up.  No concerning review of systems or physical exam findings. Last mammogram in January 2024 with no concerns for malignancy.  At this time she will return to clinic in 6 months or sooner as needed.  Total time spent: 30 minutes including history, physical, review of records, counseling and coordination of care

## 2023-03-14 ENCOUNTER — Ambulatory Visit (AMBULATORY_SURGERY_CENTER): Payer: 59 | Admitting: *Deleted

## 2023-03-14 ENCOUNTER — Encounter: Payer: Self-pay | Admitting: Gastroenterology

## 2023-03-14 VITALS — Ht 62.0 in | Wt 194.0 lb

## 2023-03-14 DIAGNOSIS — Z1211 Encounter for screening for malignant neoplasm of colon: Secondary | ICD-10-CM

## 2023-03-14 MED ORDER — NA SULFATE-K SULFATE-MG SULF 17.5-3.13-1.6 GM/177ML PO SOLN: 1.0000 | Freq: Once | ORAL | 0 refills | Status: AC

## 2023-03-14 NOTE — Progress Notes (Signed)
Pt's previsit is done over the phone and all paperwork (prep instructions) sent to patient. Pt's name and DOB verified at the beginning of the previsit. Pt denies any difficulty with ambulating.  No egg or soy allergy known to patient  No issues known to pt with past sedation with any surgeries or procedures Patient denies issuesr being intubated No FH of Malignant Hyperthermia Pt is not on diet pills Pt is not on  home 02  Pt is not on blood thinners  Pt denies issues with constipation  Pt is not on dialysis Pt denies any upcoming cardiac testing Next Wed Pt encouraged to use to use Singlecare or Goodrx to reduce cost  Patient's chart reviewed by Osvaldo Angst CNRA prior to previsit and patient appropriate for the [place].  Previsit completed and red dot placed by patient's name on their procedure day (on provider's schedule).  . Visit by phone Pt states her weight is 220lb Instructions reviewed with pt and pt states understanding. Instructed to review again prior to procedure. Pt states they will.  Instructions sent by mail with coupon and by my chart

## 2023-03-19 ENCOUNTER — Ambulatory Visit (HOSPITAL_COMMUNITY)
Admission: RE | Admit: 2023-03-19 | Discharge: 2023-03-19 | Disposition: A | Discharge status: Home / Self Care | Payer: 59 | Source: Ambulatory Visit | Attending: Internal Medicine | Admitting: Internal Medicine

## 2023-03-19 ENCOUNTER — Encounter (HOSPITAL_COMMUNITY): Payer: Self-pay | Admitting: Internal Medicine

## 2023-03-19 ENCOUNTER — Ambulatory Visit (HOSPITAL_BASED_OUTPATIENT_CLINIC_OR_DEPARTMENT_OTHER)
Admission: RE | Admit: 2023-03-19 | Discharge: 2023-03-19 | Disposition: A | Discharge status: Home / Self Care | Payer: 59 | Source: Ambulatory Visit | Attending: Internal Medicine | Admitting: Internal Medicine

## 2023-03-19 VITALS — BP 110/78 | HR 83 | Wt 198.4 lb

## 2023-03-19 DIAGNOSIS — I429 Cardiomyopathy, unspecified: Secondary | ICD-10-CM | POA: Insufficient documentation

## 2023-03-19 DIAGNOSIS — T451X5A Adverse effect of antineoplastic and immunosuppressive drugs, initial encounter: Secondary | ICD-10-CM

## 2023-03-19 DIAGNOSIS — C50411 Malignant neoplasm of upper-outer quadrant of right female breast: Secondary | ICD-10-CM

## 2023-03-19 DIAGNOSIS — I427 Cardiomyopathy due to drug and external agent: Secondary | ICD-10-CM

## 2023-03-19 DIAGNOSIS — I428 Other cardiomyopathies: Secondary | ICD-10-CM | POA: Diagnosis not present

## 2023-03-19 DIAGNOSIS — Z170 Estrogen receptor positive status [ER+]: Secondary | ICD-10-CM | POA: Diagnosis not present

## 2023-03-19 DIAGNOSIS — Z9221 Personal history of antineoplastic chemotherapy: Secondary | ICD-10-CM | POA: Insufficient documentation

## 2023-03-19 DIAGNOSIS — Z853 Personal history of malignant neoplasm of breast: Secondary | ICD-10-CM | POA: Insufficient documentation

## 2023-03-19 LAB — ECHOCARDIOGRAM COMPLETE
AR max vel: 2.6 cm2
AV Area VTI: 2.25 cm2
AV Area mean vel: 2.36 cm2
AV Mean grad: 5 mmHg
AV Peak grad: 9.9 mmHg
Ao pk vel: 1.57 m/s
Area-P 1/2: 4.06 cm2
Calc EF: 56.9 %
MV VTI: 3.21 cm2
S' Lateral: 2.5 cm
Single Plane A2C EF: 60.1 %
Single Plane A4C EF: 53.8 %

## 2023-03-19 NOTE — Patient Instructions (Signed)
CONGRATULATIONS YOU HAVE GRADUATED FROM THE HEART FAILURE CLINIC.  Your physician recommends that you schedule a follow-up appointment in: as needed  If you have any questions or concerns before your next appointment please send [place] a message through [place] or call our office at [phone].    TO LEAVE A MESSAGE FOR THE NURSE SELECT OPTION 2, PLEASE LEAVE A MESSAGE INCLUDING: YOUR NAME DATE OF BIRTH CALL BACK NUMBER REASON FOR CALL**this is important as we prioritize the call backs  YOU WILL RECEIVE A CALL BACK THE SAME DAY AS LONG AS YOU CALL BEFORE 4:00 PM  At the [facility], you and your health needs are our priority. As part of our continuing mission to provide you with exceptional heart care, we have created designated Provider Care Teams. These Care Teams include your primary Cardiologist (physician) and Advanced Practice Providers (APPs- Physician Assistants and Nurse Practitioners) who all work together to provide you with the care you need, when you need it.   You may see any of the following providers on your designated Care Team at your next follow up: Dr Glori Bickers Dr Loralie Champagne Dr. Roxana Hires, NP Lyda Jester, [place] [facility] [place], [place] Forestine Na, NP Audry Riles, PharmD   Please be sure to bring in all your medications bottles to every appointment.    Thank you for choosing [facility]

## 2023-03-19 NOTE — Progress Notes (Signed)
CARDIO-ONCOLOGY CLINIC NOTE  Referring Physician: Dr. Chryl Heck Primary Care: Pcp, No Primary Cardiologist: New  HPI:  Tonya Blair is a 47 y.o. female with right  breast cancer referred by Dr. Chryl Heck for enrollment into the Cardio-Oncology program.   Oncology History  Malignant neoplasm of upper-outer quadrant of right breast in female, estrogen receptor positive ([place])  12/31/2021 Initial Diagnosis    Malignant neoplasm of upper-outer quadrant of right breast in female, estrogen receptor positive ([place])      01/02/2022 Cancer Staging    Staging form: Breast, AJCC 8th Edition - Clinical stage from 01/02/2022: Stage IB (cT2, cN0, cM0, G3, ER+, PR+, HER2+) - Signed by Benay Pike, MD on 01/02/2022 Histologic grading system: 3 grade system      01/14/2022 Genetic Testing    Negative hereditary cancer genetic testing: no pathogenic variants detected in [facility] CustomNext-Cancer +RNAinsight Panel.  The report date is January 14, 2022.   The CustomNext-Cancer+RNAinsight panel offered by [facility] includes sequencing and rearrangement analysis for the following 47 genes:  APC, ATM, AXIN2, BARD1, BMPR1A, BRCA1, BRCA2, BRIP1, CDH1, CDK4, CDKN2A, CHEK2, DICER1, EPCAM, GREM1, HOXB13, MEN1, MLH1, MSH2, MSH3, MSH6, MUTYH, NBN, NF1, NF2, NTHL1, PALB2, PMS2, POLD1, POLE, PTEN, RAD51C, RAD51D, RECQL, RET, SDHA, SDHAF2, SDHB, SDHC, SDHD, SMAD4, SMARCA4, STK11, TP53, TSC1, TSC2, and VHL.  RNA data is routinely analyzed for use in variant interpretation for all genes.    01/16/2022 -  Chemotherapy    Patient is on Treatment Plan : BREAST  Docetaxel + Carboplatin + Trastuzumab + Pertuzumab  (TCHP) q21d            ONCOLOGIC HISTORY: 1) 12/11/2021: Diagnostic mammogram and ultrasound showed suspicious mass in the 9 o'clock region of the right breast. No evidence of enlarged adenopathy.   2) 12/25/2021: Underwent US guided right breast core needle biopsy of breast mass at 9 o'clock. 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  Prognostics from this mass showed ER 70% moderate, PR 20% moderate, HER2 positive by IHC, Ki-67 of 30%.   3) 01/09/2022:  MR Breast Bilateral: Biopsy proven malignancy in the outer right breast with associated linear/nodular enhancement extending anteriorly altogether measuring up to 5.1 cm. Indeterminate 0.6 cm tubular enhancing mass in the outer left breast.   4) 01/16/2022: Initiated neoadjuvant therapy with TCHP q 21 days given T2 tumor with HER2 amplification.    5) 01/17/2022: Underwent MRI guided biopsy of indeterminate 6 mm enhancing mass over the left outer lower quadrant. 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 No evidence of malignancy.    6) Completed neoadjuvant chemotherapy TCHP from 01/16/2022 to 05/01/2022  Got H/P on 5/30 and further dosing stopped due to reduced EF on echo . We saw her for the first time in 7/23 and Herceptin/Perjeta restarted. Toprol and losartan started. She is here for f/u echo.   Here for routine f/u. Finished Herceptin/Perjeta on 01/08/23. Feels great. Exercising. Off Toprol/losartan  Echo today EF 60-65% GLS -17.1 (underestimated)  Echo 01/13/23 EF 50-55% GLS -20.2%    ECHO: 12/29/21 EF 60-65% GLS -22.5% 03/29/22 EF 60-65% GLS -19.7 % (underestimated on 3-chamber)  06/26/22 EF 53% GLS -18.8% Echo 08/12/22 EF 55-60% GLS -24.3%   Past Medical History:  Diagnosis Date   Anemia    IDA   Cancer ([place])    right breast cancer   Family history of prostate cancer 01/02/2022    Current Outpatient Medications  Medication Sig Dispense Refill   Ascorbic Acid (VITAMIN C PO) Take 1 tablet by mouth in the morning.     BIOTIN PO Take 1 tablet by mouth in the morning.     Cholecalciferol (VITAMIN D3 PO) Take 1 tablet by mouth in the morning.     COD LIVER OIL PO Take by mouth.     Multiple Minerals-Vitamins (CAL MAG ZINC +D3 PO) Take 1 tablet by  mouth in the morning.     OVER THE COUNTER MEDICATION Take 1 capsule by mouth in the morning. Sea Moss Superfood Capsule     paragard intrauterine copper IUD IUD 1 each by Intrauterine route once.     tamoxifen (NOLVADEX) 20 MG tablet Take 1 tablet (20 mg total) by mouth daily. 90 tablet 3   Turmeric (QC TUMERIC COMPLEX PO) Take by mouth.     No current facility-administered medications for this encounter.    Allergies  Allergen Reactions   Codeine Nausea And Vomiting   Kiwi Extract Itching and Swelling      Social History   Socioeconomic History   Marital status: Single    Spouse name: Not on file   Number of children: Not on file   Years of education: Not on file   Highest education level: Not on file  Occupational History   Not on file  Tobacco Use   Smoking status: Never   Smokeless tobacco: Never  Vaping Use   Vaping Use: Never used  Substance and Sexual Activity   Alcohol use: Yes    Comment: occasional wine   Drug use: Never   Sexual activity: Yes    Birth control/protection: I.U.D.    Comment: Mirena IUD  Other Topics Concern   Not on file  Social History Narrative   Not on file   Social Determinants of Health   Financial Resource Strain: Low Risk  (01/02/2022)   Overall Financial Resource Strain (CARDIA)    Difficulty of Paying Living Expenses: Not hard at all  Food Insecurity: No Food Insecurity (01/02/2022)   Hunger Vital Sign    Worried About Running Out of Food in the Last Year: Never true    Ran Out of Food in the Last Year: Never true  Transportation Needs: No Transportation Needs (01/02/2022)   PRAPARE - Hydrologist (Medical): No    Lack of Transportation (Non-Medical): No  Physical Activity: Not on file  Stress: Not on file  Social Connections: Not on file  Intimate Partner Violence: Not on file      Family History  Problem Relation Age of Onset   Prostate cancer Father 38   Colon cancer Neg Hx    Colon  polyps Neg Hx    Esophageal cancer Neg Hx    Rectal cancer Neg Hx    Stomach cancer Neg Hx     Vitals:   03/19/23 1340  BP: 110/78  Pulse: 83  SpO2: 99%  Weight: 90 kg (198 lb 6.4 oz)     PHYSICAL EXAM: General:  Well appearing. No resp difficulty HEENT: normal Neck: supple. no JVD. Carotids 2+ bilat; no bruits. No lymphadenopathy or thryomegaly appreciated. Cor: PMI nondisplaced. Regular rate & rhythm. No rubs, gallops or murmurs. Lungs: clear Abdomen: soft, nontender, nondistended. No hepatosplenomegaly. No bruits or masses. Good bowel sounds. Extremities: no cyanosis, clubbing, rash, edema Neuro: alert & orientedx3, cranial nerves grossly intact. moves all 4 extremities w/o difficulty. Affect pleasant   ASSESSMENT & PLAN:  1. 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 Diagnosed 1/23  - Completed neoadjuvant chemotherapy TCHP from 01/16/2022 to 05/01/2022 - Scheduled for H/P q21 days. Received 1 dose 05/21/22 -> stopped due to decreased EF - Echo 12/29/21 EF 60-65% GLS -22.5% - Echo 03/29/22 EF 60-65% GLS -19.7 % (underestimated on 3-chamber)  - Echo 06/26/22 EF 53% GLS -18.8% - Echo  08/12/22 EF 55-60% GLS -24.3% - Echo today 01/13/23 EF 50-55% GLS -20.2%  - I suspect she had mild herceptin/perjeta cardiotoxicity.  - Finished Herceptin/Perjeta on 01/08/23.  - Echo today 03/19/23 EF 60-65% GLS -17.1 (underestimated) - EF completely normal - Can f/u as needed  Tonya Bickers, MD  2:02 PM

## 2023-03-19 NOTE — Progress Notes (Signed)
*  PRELIMINARY RESULTS* Echocardiogram 2D Echocardiogram has been performed.  Tonya Blair 03/19/2023, 1:48 PM

## 2023-04-03 ENCOUNTER — Encounter: Payer: Self-pay | Admitting: Certified Registered Nurse Anesthetist

## 2023-04-04 ENCOUNTER — Encounter: Payer: Self-pay | Admitting: Gastroenterology

## 2023-04-04 ENCOUNTER — Ambulatory Visit (AMBULATORY_SURGERY_CENTER): Payer: 59 | Admitting: Gastroenterology

## 2023-04-04 VITALS — BP 120/65 | HR 76 | Temp 96.8°F | Resp 15 | Ht 62.0 in | Wt 194.0 lb

## 2023-04-04 DIAGNOSIS — D128 Benign neoplasm of rectum: Secondary | ICD-10-CM

## 2023-04-04 DIAGNOSIS — Z1211 Encounter for screening for malignant neoplasm of colon: Secondary | ICD-10-CM | POA: Diagnosis not present

## 2023-04-04 DIAGNOSIS — D120 Benign neoplasm of cecum: Secondary | ICD-10-CM

## 2023-04-04 MED ORDER — SODIUM CHLORIDE 0.9 % IV SOLN: 500.0000 mL | INTRAVENOUS | Status: DC

## 2023-04-04 NOTE — Progress Notes (Signed)
Pt's states no medical or surgical changes since previsit or office visit. 

## 2023-04-04 NOTE — Progress Notes (Signed)
B/p 68/50 and repeat was 85/57.  Cwall, CRNA notified.  Orders received to start 2 IV lines and bolus each line.

## 2023-04-04 NOTE — Op Note (Signed)
[facility] Patient Name: Tonya Blair Procedure Date: 04/04/2023 8:33 AM MRN: [id number] Endoscopist: Tressia Danas MD, MD, [id number] Age: 47 Referring MD:  Date of Birth: 04/05/1976 Gender: Female Account #: [id number] Procedure:                Colonoscopy Indications:              Screening for colorectal malignant neoplasm, This                            is the patient's first colonoscopy                           No known family history of colon cancer or polyps Medicines:                Monitored Anesthesia Care Procedure:                Pre-Anesthesia Assessment:                           - Prior to the procedure, a History and Physical                            was performed, and patient medications and                            allergies were reviewed. The patient's tolerance of                            previous anesthesia was also reviewed. The risks                            and benefits of the procedure and the sedation                            options and risks were discussed with the patient.                            All questions were answered, and informed consent                            was obtained. Prior Anticoagulants: The patient has                            taken no anticoagulant or antiplatelet agents. ASA                            Grade Assessment: II - A patient with mild systemic                            disease. After reviewing the risks and benefits,                            the patient was deemed in satisfactory condition to  undergo the procedure.                           After obtaining informed consent, the colonoscope                            was passed under direct vision. Throughout the                            procedure, the patient's blood pressure, pulse, and                            oxygen saturations were monitored continuously. The                            Olympus CF-HQ190L  ([id number]) Colonoscope was                            introduced through the anus and advanced to the 3                            cm into the ileum. A second forward view of the                            right colon was performed. The colonoscopy was                            performed without difficulty. The patient tolerated                            the procedure well. The quality of the bowel                            preparation was good. The ileocecal valve,                            appendiceal orifice, and rectum were photographed. Scope In: 8:45:37 AM Scope Out: 9:02:09 AM Scope Withdrawal Time: 0 hours 13 minutes 59 seconds  Total Procedure Duration: 0 hours 16 minutes 32 seconds  Findings:                 The perianal and digital rectal examinations were                            normal.                           A 2 mm polyp was found in the rectum. The polyp was                            sessile. The polyp was removed with a cold snare.                            Resection and retrieval were complete. Estimated  blood loss was minimal.                           A 2 mm polyp was found in the ileocecal valve. The                            polyp was sessile. The polyp was removed with a                            cold snare. Resection and retrieval were complete.                            Estimated blood loss was minimal.                           The exam was otherwise without abnormality on                            direct and retroflexion views. Complications:            No immediate complications. Estimated Blood Loss:     Estimated blood loss was minimal. Impression:               - One 2 mm polyp in the rectum, removed with a cold                            snare. Resected and retrieved.                           - One 2 mm polyp at the ileocecal valve, removed                            with a cold snare. Resected and retrieved.                            - The examination was otherwise normal on direct                            and retroflexion views. Recommendation:           - Patient has a contact number available for                            emergencies. The signs and symptoms of potential                            delayed complications were discussed with the                            patient. Return to normal activities tomorrow.                            Written discharge instructions were provided to the  patient.                           - Resume previous diet.                           - Continue present medications.                           - Await pathology results.                           - Repeat colonoscopy date to be determined after                            pending pathology results are reviewed for                            surveillance.                           - Emerging evidence supports eating a diet of                            fruits, vegetables, grains, calcium, and yogurt                            while reducing red meat and alcohol may reduce the                            risk of colon cancer.                           - Thank you for allowing me to be involved in your                            colon cancer prevention. Tressia Danas MD, MD 04/04/2023 9:12:17 AM This report has been signed electronically.

## 2023-04-04 NOTE — Progress Notes (Signed)
Referring Provider: Tressia Danas, MD Primary Care Physician:  Pcp, No   Indication for Colonoscopy:  Colon cancer screening   IMPRESSION:  Need for colon cancer screening Appropriate candidate for monitored anesthesia care  PLAN: Colonoscopy in the [facility] today   HPI: Tonya Blair is a 47 y.o. female presents for screening colonoscopy.  No prior colonoscopy or colon cancer screening.  No known family history of colon cancer or polyps. No family history of uterine/endometrial cancer, pancreatic cancer or gastric/stomach cancer.   Past Medical History:  Diagnosis Date   Anemia    IDA   Cancer    right breast cancer   Family history of prostate cancer 01/02/2022    Past Surgical History:  Procedure Laterality Date   BREAST BIOPSY Right 12/25/2021   BREAST BIOPSY Left    BREAST LUMPECTOMY Right 06/19/2022   BREAST LUMPECTOMY WITH RADIOACTIVE SEED AND SENTINEL LYMPH NODE BIOPSY Right 06/19/2022   Procedure: RADIOACTIVE SEED GUIDED RIGHT BREAST LUMPECTOMY, RIGHT AXILLARY SENTINEL LYMPH NODE BIOPSY;  Surgeon: Emelia Loron, MD;  Location: [facility];  Service: General;  Laterality: Right;   PORTACATH PLACEMENT N/A 01/14/2022   Procedure: INSERTION PORT-A-CATH;  Surgeon: Emelia Loron, MD;  Location: [facility] OR;  Service: General;  Laterality: N/A;   THERAPEUTIC ABORTION     WISDOM TOOTH EXTRACTION      Current Outpatient Medications  Medication Sig Dispense Refill   Ascorbic Acid (VITAMIN C PO) Take 1 tablet by mouth in the morning.     BIOTIN PO Take 1 tablet by mouth in the morning.     Cholecalciferol (VITAMIN D3 PO) Take 1 tablet by mouth in the morning.     COD LIVER OIL PO Take by mouth.     Multiple Minerals-Vitamins (CAL MAG ZINC +D3 PO) Take 1 tablet by mouth in the morning.     tamoxifen (NOLVADEX) 20 MG tablet Take 1 tablet (20 mg total) by mouth daily. 90 tablet 3   Turmeric (QC TUMERIC COMPLEX PO) Take by mouth.     OVER THE  COUNTER MEDICATION Take 1 capsule by mouth in the morning. Sea Moss Superfood Capsule     paragard intrauterine copper IUD IUD 1 each by Intrauterine route once.     Current Facility-Administered Medications  Medication Dose Route Frequency Provider Last Rate Last Admin   0.9 %  sodium chloride infusion  500 mL Intravenous Continuous Tressia Danas, MD        Allergies as of 04/04/2023 - Review Complete 04/04/2023  Allergen Reaction Noted   Codeine Nausea And Vomiting 01/02/2022   Kiwi extract Itching and Swelling 06/10/2022    Family History  Problem Relation Age of Onset   Prostate cancer Father 54   Colon cancer Neg Hx    Colon polyps Neg Hx    Esophageal cancer Neg Hx    Rectal cancer Neg Hx    Stomach cancer Neg Hx      Physical Exam: General:   Alert,  well-nourished, pleasant and cooperative in NAD Head:  Normocephalic and atraumatic. Eyes:  Sclera clear, no icterus.   Conjunctiva pink. Mouth:  No deformity or lesions.   Neck:  Supple; no masses or thyromegaly. Lungs:  Clear throughout to auscultation.   No wheezes. Heart:  Regular rate and rhythm; no murmurs. Abdomen:  Soft, non-tender, nondistended, normal bowel sounds, no rebound or guarding.  Msk:  Symmetrical. No boney deformities LAD: No inguinal or umbilical LAD Extremities:  No clubbing or edema.  Neurologic:  Alert and  oriented x4;  grossly nonfocal Skin:  No obvious rash or bruise. Psych:  Alert and cooperative. Normal mood and affect.     Studies/Results: No results found.    Cordel Drewes L. Orvan Falconer, MD, MPH 04/04/2023, 8:39 AM

## 2023-04-04 NOTE — Patient Instructions (Addendum)
Await pathology Repeat colonoscopy date to be determined after pending pathology results are reviewed for surveillance. Emerging evidence supports eating a diet of fruits, vegetables, grains, calcium, and yogurt while reducing red meat and alcohol may reduce the risk of colon cancer.                       Thank you for allowing me to be involved in your colon cancer prevention.       Please read over handouts about polyps                                                                                                                                                 YOU HAD AN ENDOSCOPIC PROCEDURE TODAY AT THE [facility]:   Refer to the procedure report that was given to you for any specific questions about what was found during the examination.  If the procedure report does not answer your questions, please call your gastroenterologist to clarify.  If you requested that your care partner not be given the details of your procedure findings, then the procedure report has been included in a sealed envelope for you to review at your convenience later.  YOU SHOULD EXPECT: Some feelings of bloating in the abdomen. Passage of more gas than usual.  Walking can help get rid of the air that was put into your GI tract during the procedure and reduce the bloating. If you had a lower endoscopy (such as a colonoscopy or flexible sigmoidoscopy) you may notice spotting of blood in your stool or on the toilet paper. If you underwent a bowel prep for your procedure, you may not have a normal bowel movement for a few days.  Please Note:  You might notice some irritation and congestion in your nose or some drainage.  This is from the oxygen used during your procedure.  There is no need for concern and it should clear up in a day or so.  SYMPTOMS TO REPORT IMMEDIATELY:  Following lower endoscopy (colonoscopy or flexible sigmoidoscopy):  Excessive amounts of blood in the stool  Significant tenderness or  worsening of abdominal pains  Swelling of the abdomen that is new, acute  Fever of 100F or higher  For urgent or emergent issues, a gastroenterologist can be reached at any hour by calling [phone]. Do not use MyChart messaging for urgent concerns.    DIET:  We do recommend a small meal at first, but then you may proceed to your regular diet.  Drink plenty of fluids but you should avoid alcoholic beverages for 24 hours.  ACTIVITY:  You should plan to take it easy for the rest of today and you should NOT DRIVE or use heavy machinery until tomorrow (because of the sedation medicines used during the test).    FOLLOW UP: Our staff  will call the number listed on your records the next business day following your procedure.  We will call around 7:15- 8:00 am to check on you and address any questions or concerns that you may have regarding the information given to you following your procedure. If we do not reach you, we will leave a message.     If any biopsies were taken you will be contacted by phone or by letter within the next 1-3 weeks.  Please call us at [phone] if you have not heard about the biopsies in 3 weeks.    SIGNATURES/CONFIDENTIALITY: You and/or your care partner have signed paperwork which will be entered into your electronic medical record.  These signatures attest to the fact that that the information above on your After Visit Summary has been reviewed and is understood.  Full responsibility of the confidentiality of this discharge information lies with you and/or your care-partner.

## 2023-04-04 NOTE — Progress Notes (Signed)
Called to room to assist during endoscopic procedure.  Patient ID and intended procedure confirmed with present staff. Received instructions for my participation in the procedure from the performing physician.  

## 2023-04-04 NOTE — Progress Notes (Signed)
Report given to PACU, vss 

## 2023-04-07 ENCOUNTER — Telehealth: Payer: Self-pay | Admitting: *Deleted

## 2023-04-07 NOTE — Telephone Encounter (Signed)
Follow up call attempt.  LVM to call if any questions or concerns. 

## 2023-04-10 ENCOUNTER — Encounter: Payer: Self-pay | Admitting: Hematology and Oncology

## 2023-04-14 ENCOUNTER — Ambulatory Visit: Payer: 59 | Attending: General Surgery

## 2023-04-14 VITALS — Wt 197.5 lb

## 2023-04-14 DIAGNOSIS — Z483 Aftercare following surgery for neoplasm: Secondary | ICD-10-CM | POA: Insufficient documentation

## 2023-04-14 NOTE — Therapy (Signed)
  OUTPATIENT PHYSICAL THERAPY SOZO SCREENING NOTE   Patient Name: Tonya Blair MRN: [id number] DOB:[birth year], 47 y.o., female Today's Date: 04/14/2023  PCP: Oneita Hurt, No REFERRING PROVIDER: Emelia Loron, MD   PT End of Session - 04/14/23 0840     Visit Number 2   # unchanged due to screen only   PT Start Time 0837    PT Stop Time 0841    PT Time Calculation (min) 4 min    Activity Tolerance Patient tolerated treatment well    Behavior During Therapy [facility] for tasks assessed/performed             Past Medical History:  Diagnosis Date   Anemia    IDA   Cancer    right breast cancer   Family history of prostate cancer 01/02/2022   Past Surgical History:  Procedure Laterality Date   BREAST BIOPSY Right 12/25/2021   BREAST BIOPSY Left    BREAST LUMPECTOMY Right 06/19/2022   BREAST LUMPECTOMY WITH RADIOACTIVE SEED AND SENTINEL LYMPH NODE BIOPSY Right 06/19/2022   Procedure: RADIOACTIVE SEED GUIDED RIGHT BREAST LUMPECTOMY, RIGHT AXILLARY SENTINEL LYMPH NODE BIOPSY;  Surgeon: Emelia Loron, MD;  Location: [facility];  Service: General;  Laterality: Right;   PORTACATH PLACEMENT N/A 01/14/2022   Procedure: INSERTION PORT-A-CATH;  Surgeon: Emelia Loron, MD;  Location: [facility] OR;  Service: General;  Laterality: N/A;   THERAPEUTIC ABORTION     WISDOM TOOTH EXTRACTION     Patient Active Problem List   Diagnosis Date Noted   Port-A-Cath in place 04/10/2022   Anemia due to antineoplastic chemotherapy 04/10/2022   Chemotherapy induced diarrhea 03/19/2022   Genetic testing 01/18/2022   Family history of prostate cancer 01/02/2022   Malignant neoplasm of upper-outer quadrant of right breast in female, estrogen receptor positive 12/31/2021   Cervical intraepithelial neoplasia grade 2 10/28/2019    REFERRING DIAG: right breast cancer at risk for lymphedema  THERAPY DIAG: Aftercare following surgery for neoplasm  PERTINENT HISTORY: Completed neoadjuvant  chemotherapy TCHP May 2023. Rt lumpectomy 06/19/22 showing no signs of carcinoma. 4 negative lymph node. Will be having radiation Thursday.   PRECAUTIONS: right UE Lymphedema risk, None  SUBJECTIVE: Pt returns for her 3 month L-Dex screen.   PAIN:  Are you having pain? No  SOZO SCREENING: Patient was assessed today using the SOZO machine to determine the lymphedema index score. This was compared to her baseline score. It was determined that she is within the recommended range when compared to her baseline and no further action is needed at this time. She will continue SOZO screenings. These are done every 3 months for 2 years post operatively followed by every 6 months for 2 years, and then annually.   L-DEX FLOWSHEETS - 04/14/23 0800       L-DEX LYMPHEDEMA SCREENING   Measurement Type Unilateral    L-DEX MEASUREMENT EXTREMITY Upper Extremity    POSITION  Standing    DOMINANT SIDE Right    At Risk Side Right    BASELINE SCORE (UNILATERAL) 1.7    L-DEX SCORE (UNILATERAL) -1    VALUE CHANGE (UNILAT) -2.7               Hermenia Bers, PTA 04/14/2023, 8:43 AM

## 2023-04-20 ENCOUNTER — Encounter: Payer: Self-pay | Admitting: Gastroenterology

## 2023-07-14 ENCOUNTER — Ambulatory Visit: Payer: 59 | Attending: General Surgery | Admitting: Rehabilitation

## 2023-07-14 DIAGNOSIS — C50411 Malignant neoplasm of upper-outer quadrant of right female breast: Secondary | ICD-10-CM | POA: Insufficient documentation

## 2023-07-14 DIAGNOSIS — Z483 Aftercare following surgery for neoplasm: Secondary | ICD-10-CM | POA: Insufficient documentation

## 2023-07-14 DIAGNOSIS — Z170 Estrogen receptor positive status [ER+]: Secondary | ICD-10-CM | POA: Insufficient documentation

## 2023-07-14 NOTE — Therapy (Signed)
  OUTPATIENT PHYSICAL THERAPY SOZO SCREENING NOTE   Patient Name: Tonya Blair MRN: [id number] DOB:13-Jul-1976, 47 y.o., female Today's Date: 07/14/2023  PCP: Oneita Hurt, No REFERRING PROVIDER: Emelia Loron, MD   PT End of Session - 07/14/23 0835     Visit Number 2   screen only   PT Start Time 0830    PT Stop Time 0835    PT Time Calculation (min) 5 min    Activity Tolerance Patient tolerated treatment well    Behavior During Therapy [facility] for tasks assessed/performed             Past Medical History:  Diagnosis Date   Anemia    IDA   Cancer (HCC)    right breast cancer   Family history of prostate cancer 01/02/2022   Past Surgical History:  Procedure Laterality Date   BREAST BIOPSY Right 12/25/2021   BREAST BIOPSY Left    BREAST LUMPECTOMY Right 06/19/2022   BREAST LUMPECTOMY WITH RADIOACTIVE SEED AND SENTINEL LYMPH NODE BIOPSY Right 06/19/2022   Procedure: RADIOACTIVE SEED GUIDED RIGHT BREAST LUMPECTOMY, RIGHT AXILLARY SENTINEL LYMPH NODE BIOPSY;  Surgeon: Emelia Loron, MD;  Location: [facility];  Service: General;  Laterality: Right;   PORTACATH PLACEMENT N/A 01/14/2022   Procedure: INSERTION PORT-A-CATH;  Surgeon: Emelia Loron, MD;  Location: [facility] OR;  Service: General;  Laterality: N/A;   THERAPEUTIC ABORTION     WISDOM TOOTH EXTRACTION     Patient Active Problem List   Diagnosis Date Noted   Port-A-Cath in place 04/10/2022   Anemia due to antineoplastic chemotherapy 04/10/2022   Chemotherapy induced diarrhea 03/19/2022   Genetic testing 01/18/2022   Family history of prostate cancer 01/02/2022   Malignant neoplasm of upper-outer quadrant of right breast in female, estrogen receptor positive (HCC) 12/31/2021   Cervical intraepithelial neoplasia grade 2 10/28/2019    REFERRING DIAG: right breast cancer at risk for lymphedema  THERAPY DIAG: Aftercare following surgery for neoplasm  Malignant neoplasm of upper-outer quadrant of  right breast in female, estrogen receptor positive (HCC)  PERTINENT HISTORY: Completed neoadjuvant chemotherapy TCHP May 2023. Rt lumpectomy 06/19/22 showing no signs of carcinoma. 4 negative lymph node. Will be having radiation Thursday.   PRECAUTIONS: right UE Lymphedema risk, None  SUBJECTIVE: Pt returns for her 3 month L-Dex screen.   PAIN:  Are you having pain? No  SOZO SCREENING: Patient was assessed today using the SOZO machine to determine the lymphedema index score. This was compared to her baseline score. It was determined that she is within the recommended range when compared to her baseline and no further action is needed at this time. She will continue SOZO screenings. These are done every 3 months for 2 years post operatively followed by every 6 months for 2 years, and then annually.   L-DEX FLOWSHEETS - 07/14/23 0800       L-DEX LYMPHEDEMA SCREENING   Measurement Type Unilateral    L-DEX MEASUREMENT EXTREMITY Upper Extremity    POSITION  Standing    DOMINANT SIDE Right    At Risk Side Right    BASELINE SCORE (UNILATERAL) 1.7    L-DEX SCORE (UNILATERAL) -1.1    VALUE CHANGE (UNILAT) -2.8    Comment 200#               Idamae Lusher, PT 07/14/2023, 8:35 AM

## 2023-07-16 ENCOUNTER — Other Ambulatory Visit: Payer: Self-pay

## 2023-09-09 ENCOUNTER — Inpatient Hospital Stay: Payer: 59 | Attending: Adult Health | Admitting: Adult Health

## 2023-09-09 ENCOUNTER — Encounter: Payer: Self-pay | Admitting: Adult Health

## 2023-09-09 VITALS — BP 118/71 | HR 88 | Temp 97.2°F | Resp 18 | Wt 203.4 lb

## 2023-09-09 DIAGNOSIS — Z79810 Long term (current) use of selective estrogen receptor modulators (SERMs): Secondary | ICD-10-CM | POA: Diagnosis not present

## 2023-09-09 DIAGNOSIS — Z170 Estrogen receptor positive status [ER+]: Secondary | ICD-10-CM | POA: Insufficient documentation

## 2023-09-09 DIAGNOSIS — C50411 Malignant neoplasm of upper-outer quadrant of right female breast: Secondary | ICD-10-CM | POA: Diagnosis not present

## 2023-09-09 NOTE — Assessment & Plan Note (Signed)
Tonya Blair is a 47 year old woman with history of stage Ib triple positive breast cancer diagnosed in January 2023 she is status post neoadjuvant chemotherapy followed by lumpectomy, maintenance trastuzumab x , adjuvant radiation, and antiestrogen therapy with tamoxifen which began in August 2023.  Stage IB triple positive breast cancer: She has no clinical or radiographic signs of breast cancer recurrence.  She continues on tamoxifen with good tolerance.  She will continue with annual mammograms next due in January 2025. Health maintenance: She continues to see her gynecologist as her primary care annually.  She is exercising regularly we discussed healthy diet with fruits and vegetables today as well.  RTC in 6 months for f/u.

## 2023-09-09 NOTE — Progress Notes (Signed)
[facility] Cancer Follow up:    Tonya Baars, MD [address] [place] [address]   DIAGNOSIS:  Cancer Staging  Malignant neoplasm of upper-outer quadrant of right breast in female, estrogen receptor positive (HCC) Staging form: Breast, AJCC 8th Edition - Clinical stage from 01/02/2022: Stage IB (cT2, cN0, cM0, G3, ER+, PR+, HER2+) - Signed by Rachel Moulds, MD on 01/02/2022 Histologic grading system: 3 grade system   SUMMARY OF ONCOLOGIC HISTORY: Oncology History  Malignant neoplasm of upper-outer quadrant of right breast in female, estrogen receptor positive (HCC)  12/31/2021 Initial Diagnosis   Malignant neoplasm of upper-outer quadrant of right breast in female, estrogen receptor positive (HCC)   01/02/2022 Cancer Staging   Staging form: Breast, AJCC 8th Edition - Clinical stage from 01/02/2022: Stage IB (cT2, cN0, cM0, G3, ER+, PR+, HER2+) - Signed by Rachel Moulds, MD on 01/02/2022 Histologic grading system: 3 grade system   01/14/2022 Genetic Testing   Negative hereditary cancer genetic testing: no pathogenic variants detected in [facility] CustomNext-Cancer +RNAinsight Panel.  The report date is January 14, 2022.  The CustomNext-Cancer+RNAinsight panel offered by [facility] includes sequencing and rearrangement analysis for the following 47 genes:  APC, ATM, AXIN2, BARD1, BMPR1A, BRCA1, BRCA2, BRIP1, CDH1, CDK4, CDKN2A, CHEK2, DICER1, EPCAM, GREM1, HOXB13, MEN1, MLH1, MSH2, MSH3, MSH6, MUTYH, NBN, NF1, NF2, NTHL1, PALB2, PMS2, POLD1, POLE, PTEN, RAD51C, RAD51D, RECQL, RET, SDHA, SDHAF2, SDHB, SDHC, SDHD, SMAD4, SMARCA4, STK11, TP53, TSC1, TSC2, and VHL.  RNA data is routinely analyzed for use in variant interpretation for all genes.   01/16/2022 - 08/14/2022 Chemotherapy   Completed chemotherapy May 2023, received 6 cycles of neoadjuvant TCHP Patient is on Treatment Plan : BREAST  Docetaxel + Carboplatin + Trastuzumab + Pertuzumab  (TCHP) q21d        01/16/2022 - 01/08/2023 Chemotherapy   Patient is on Treatment Plan : BREAST  Docetaxel + Carboplatin + Trastuzumab + Pertuzumab  (TCHP) q21d / Trastuzumab + Pertuzumab q21d     06/19/2022 Definitive Surgery   Right breast lumpectomy showed fibrosis with mild inflammation, negative for residual carcinoma, all surgical margins negative for carcinoma, right axillary sentinel lymph node negative for metastatic carcinoma, final pathologic staging [id number]   07/18/2022 - 08/09/2022 Radiation Therapy   Site Technique Total Dose (Gy) Dose per Fx (Gy) Completed Fx Beam Energies  Breast, Right: Breast_R 3D 42.56/42.56 2.66 16/16 10X, 6XFFF     07/2022 -  Anti-estrogen oral therapy   Tamoxifen     CURRENT THERAPY: Tamoxifen  INTERVAL HISTORY: Tonya Blair 47 y.o. female returns for follow-up of her history of breast cancer.  She continues on tamoxifen Monday through Fridays with good tolerance--she has decided not to take any medications on Saturdays and Sundays.  She has hot flashes that are manageable.   She has noticeable vaginal discharge that is not particularly bothersome.   Her most recent mammogram occurred on January 07, 2023 demonstrating no mammographic evidence of malignancy and breast density category C.  She was seen by Dr. Gala Romney in March 2024.  She had been off of Toprol and losartan and her EF was 60 to 65%.  She was cleared to graduate from clinic and follow-up with them at that time.  She sees Dr. Chestine Spore at [facility] annually.  She exercises by weightlifting and cardio 4 days a week.     Patient Active Problem List   Diagnosis Date Noted   Port-A-Cath in place 04/10/2022  Anemia due to antineoplastic chemotherapy 04/10/2022   Chemotherapy induced diarrhea 03/19/2022   Genetic testing 01/18/2022   Family history of prostate cancer 01/02/2022   Malignant neoplasm of upper-outer quadrant of right breast in female, estrogen receptor positive (HCC) 12/31/2021    Cervical intraepithelial neoplasia grade 2 10/28/2019    is allergic to codeine and kiwi extract.  MEDICAL HISTORY: Past Medical History:  Diagnosis Date   Anemia    IDA   Cancer (HCC)    right breast cancer   Family history of prostate cancer 01/02/2022    SURGICAL HISTORY: Past Surgical History:  Procedure Laterality Date   BREAST BIOPSY Right 12/25/2021   BREAST BIOPSY Left    BREAST LUMPECTOMY Right 06/19/2022   BREAST LUMPECTOMY WITH RADIOACTIVE SEED AND SENTINEL LYMPH NODE BIOPSY Right 06/19/2022   Procedure: RADIOACTIVE SEED GUIDED RIGHT BREAST LUMPECTOMY, RIGHT AXILLARY SENTINEL LYMPH NODE BIOPSY;  Surgeon: Emelia Loron, MD;  Location: [facility];  Service: General;  Laterality: Right;   PORTACATH PLACEMENT N/A 01/14/2022   Procedure: INSERTION PORT-A-CATH;  Surgeon: Emelia Loron, MD;  Location: [facility] OR;  Service: General;  Laterality: N/A;   THERAPEUTIC ABORTION     WISDOM TOOTH EXTRACTION      SOCIAL HISTORY: Social History   Socioeconomic History   Marital status: Single    Spouse name: Not on file   Number of children: Not on file   Years of education: Not on file   Highest education level: Not on file  Occupational History   Not on file  Tobacco Use   Smoking status: Never   Smokeless tobacco: Never  Vaping Use   Vaping status: Never Used  Substance and Sexual Activity   Alcohol use: Yes    Comment: occasional wine   Drug use: Never   Sexual activity: Yes    Birth control/protection: I.U.D.    Comment: Mirena IUD  Other Topics Concern   Not on file  Social History Narrative   Not on file   Social Determinants of Health   Financial Resource Strain: Low Risk  (01/02/2022)   Overall Financial Resource Strain (CARDIA)    Difficulty of Paying Living Expenses: Not hard at all  Food Insecurity: No Food Insecurity (01/02/2022)   Hunger Vital Sign    Worried About Running Out of Food in the Last Year: Never true    Ran Out of  Food in the Last Year: Never true  Transportation Needs: No Transportation Needs (01/02/2022)   PRAPARE - Administrator, Civil Service (Medical): No    Lack of Transportation (Non-Medical): No  Physical Activity: Not on file  Stress: Not on file  Social Connections: Not on file  Intimate Partner Violence: Not on file    FAMILY HISTORY: Family History  Problem Relation Age of Onset   Prostate cancer Father 53   Colon cancer Neg Hx    Colon polyps Neg Hx    Esophageal cancer Neg Hx    Rectal cancer Neg Hx    Stomach cancer Neg Hx     Review of Systems  Constitutional:  Negative for appetite change, chills, fatigue, fever and unexpected weight change.  HENT:   Negative for hearing loss, lump/mass and trouble swallowing.   Eyes:  Negative for eye problems and icterus.  Respiratory:  Negative for chest tightness, cough and shortness of breath.   Cardiovascular:  Negative for chest pain, leg swelling and palpitations.  Gastrointestinal:  Negative for abdominal distention, abdominal pain,  constipation, diarrhea, nausea and vomiting.  Endocrine: Positive for hot flashes.  Genitourinary:  Negative for difficulty urinating.   Musculoskeletal:  Negative for arthralgias.  Skin:  Negative for itching and rash.  Neurological:  Negative for dizziness, extremity weakness, headaches and numbness.  Hematological:  Negative for adenopathy. Does not bruise/bleed easily.  Psychiatric/Behavioral:  Negative for depression. The patient is not nervous/anxious.       PHYSICAL EXAMINATION  Vitals:   09/09/23 0906  BP: 118/71  Pulse: 88  Resp: 18  Temp: (!) 97.2 F (36.2 C)  SpO2: 98%    Physical Exam Constitutional:      General: She is not in acute distress.    Appearance: Normal appearance. She is not toxic-appearing.  HENT:     Head: Normocephalic and atraumatic.     Mouth/Throat:     Mouth: Mucous membranes are moist.     Pharynx: Oropharynx is clear. No oropharyngeal  exudate or posterior oropharyngeal erythema.  Eyes:     General: No scleral icterus. Cardiovascular:     Rate and Rhythm: Normal rate and regular rhythm.     Pulses: Normal pulses.     Heart sounds: Normal heart sounds.  Pulmonary:     Effort: Pulmonary effort is normal.     Breath sounds: Normal breath sounds.  Chest:     Comments: Right breast status postlumpectomy and radiation no sign of local recurrence left breast is benign. Abdominal:     General: Abdomen is flat. Bowel sounds are normal. There is no distension.     Palpations: Abdomen is soft.     Tenderness: There is no abdominal tenderness.  Musculoskeletal:        General: No swelling.     Cervical back: Neck supple.  Lymphadenopathy:     Cervical: No cervical adenopathy.  Skin:    General: Skin is warm and dry.     Findings: No rash.  Neurological:     General: No focal deficit present.     Mental Status: She is alert.  Psychiatric:        Mood and Affect: Mood normal.        Behavior: Behavior normal.     LABORATORY DATA:  CBC    Component Value Date/Time   WBC 4.8 01/08/2023 0815   WBC 4.3 08/14/2022 0827   RBC 4.09 01/08/2023 0815   HGB 10.9 (L) 01/08/2023 0815   HCT 33.4 (L) 01/08/2023 0815   PLT 247 01/08/2023 0815   MCV 81.7 01/08/2023 0815   MCH 26.7 01/08/2023 0815   MCHC 32.6 01/08/2023 0815   RDW 14.8 01/08/2023 0815   LYMPHSABS 1.7 01/08/2023 0815   MONOABS 0.4 01/08/2023 0815   EOSABS 0.1 01/08/2023 0815   BASOSABS 0.0 01/08/2023 0815    CMP     Component Value Date/Time   NA 140 01/08/2023 0815   K 3.9 01/08/2023 0815   CL 108 01/08/2023 0815   CO2 28 01/08/2023 0815   GLUCOSE 133 (H) 01/08/2023 0815   BUN 15 01/08/2023 0815   CREATININE 0.87 01/08/2023 0815   CALCIUM 9.0 01/08/2023 0815   PROT 6.3 (L) 01/08/2023 0815   ALBUMIN 3.7 01/08/2023 0815   AST 16 01/08/2023 0815   ALT 19 01/08/2023 0815   ALKPHOS 83 01/08/2023 0815   BILITOT 0.2 (L) 01/08/2023 0815   GFRNONAA  >60 01/08/2023 0815           ASSESSMENT and THERAPY PLAN:   Malignant neoplasm of upper-outer quadrant  of right breast in female, estrogen receptor positive (HCC) Marcelino Duster is a 47 year old woman with history of stage Ib triple positive breast cancer diagnosed in January 2023 she is status post neoadjuvant chemotherapy followed by lumpectomy, maintenance trastuzumab x , adjuvant radiation, and antiestrogen therapy with tamoxifen which began in August 2023.  Stage IB triple positive breast cancer: She has no clinical or radiographic signs of breast cancer recurrence.  She continues on tamoxifen with good tolerance.  She will continue with annual mammograms next due in January 2025. Health maintenance: She continues to see her gynecologist as her primary care annually.  She is exercising regularly we discussed healthy diet with fruits and vegetables today as well.  RTC in 6 months for f/u.      All questions were answered. The patient knows to call the clinic with any problems, questions or concerns. We can certainly see the patient much sooner if necessary.  Total encounter time:20 minutes*in face-to-face visit time, chart review, lab review, care coordination, order entry, and documentation of the encounter time.    Lillard Anes, NP 09/09/23 9:51 AM Medical Oncology and Hematology [facility] [address] [place], [place] [address] Tel. [phone]    Fax. [phone]  *Total Encounter Time as defined by the Centers for Medicare and Medicaid Services includes, in addition to the face-to-face time of a patient visit (documented in the note above) non-face-to-face time: obtaining and reviewing outside history, ordering and reviewing medications, tests or procedures, care coordination (communications with other health care professionals or caregivers) and documentation in the medical record.

## 2023-09-26 ENCOUNTER — Other Ambulatory Visit: Payer: Self-pay | Admitting: *Deleted

## 2023-09-26 MED ORDER — TAMOXIFEN CITRATE 20 MG PO TABS: 20.0000 mg | ORAL_TABLET | Freq: Every day | ORAL | 3 refills | Status: DC

## 2023-10-07 DIAGNOSIS — Z124 Encounter for screening for malignant neoplasm of cervix: Secondary | ICD-10-CM | POA: Diagnosis not present

## 2023-10-07 DIAGNOSIS — Z113 Encounter for screening for infections with a predominantly sexual mode of transmission: Secondary | ICD-10-CM | POA: Diagnosis not present

## 2023-10-07 DIAGNOSIS — R87810 Cervical high risk human papillomavirus (HPV) DNA test positive: Secondary | ICD-10-CM | POA: Diagnosis not present

## 2023-10-07 DIAGNOSIS — Z01419 Encounter for gynecological examination (general) (routine) without abnormal findings: Secondary | ICD-10-CM | POA: Diagnosis not present

## 2023-10-20 ENCOUNTER — Ambulatory Visit: Payer: 59 | Attending: General Surgery

## 2023-10-20 VITALS — Wt 201.5 lb

## 2023-10-20 DIAGNOSIS — Z483 Aftercare following surgery for neoplasm: Secondary | ICD-10-CM | POA: Insufficient documentation

## 2023-10-20 NOTE — Therapy (Signed)
OUTPATIENT PHYSICAL THERAPY SOZO SCREENING NOTE   Patient Name: Tonya Blair MRN: [id number] DOB:[birth year], 47 y.o., female Today's Date: 10/20/2023  PCP: Marlow Baars, MD REFERRING PROVIDER: Emelia Loron, MD   PT End of Session - 10/20/23 [phone]     Visit Number 2   # unchanged due to screen only   PT Start Time 0851    PT Stop Time 0855    PT Time Calculation (min) 4 min    Activity Tolerance Patient tolerated treatment well    Behavior During Therapy [facility] for tasks assessed/performed             Past Medical History:  Diagnosis Date   Anemia    IDA   Cancer (HCC)    right breast cancer   Family history of prostate cancer 01/02/2022   Past Surgical History:  Procedure Laterality Date   BREAST BIOPSY Right 12/25/2021   BREAST BIOPSY Left    BREAST LUMPECTOMY Right 06/19/2022   BREAST LUMPECTOMY WITH RADIOACTIVE SEED AND SENTINEL LYMPH NODE BIOPSY Right 06/19/2022   Procedure: RADIOACTIVE SEED GUIDED RIGHT BREAST LUMPECTOMY, RIGHT AXILLARY SENTINEL LYMPH NODE BIOPSY;  Surgeon: Emelia Loron, MD;  Location:  SUR[facility]ERY CENTER;  Service: General;  Laterality: Right;   PORTACATH PLACEMENT N/A 01/14/2022   Procedure: INSERTION PORT-A-CATH;  Surgeon: Emelia Loron, MD;  Location: [facility] OR;  Service: General;  Laterality: N/A;   THERAPEUTIC ABORTION     WISDOM TOOTH EXTRACTION     Patient Active Problem List   Diagnosis Date Noted   Port-A-Cath in place 04/10/2022   Anemia due to antineoplastic chemotherapy 04/10/2022   Chemotherapy induced diarrhea 03/19/2022   Genetic testing 01/18/2022   Family history of prostate cancer 01/02/2022   Malignant neoplasm of upper-outer quadrant of right breast in female, estrogen receptor positive (HCC) 12/31/2021   Cervical intraepithelial neoplasia grade 2 10/28/2019    REFERRING DIAG: right breast cancer at risk for lymphedema  THERAPY DIAG: Aftercare following surgery for neoplasm  PERTINENT HISTORY:  Completed neoadjuvant chemotherapy TCHP May 2023. Rt lumpectomy 06/19/22 showing no signs of carcinoma. 4 negative lymph node. Will be having radiation Thursday.   PRECAUTIONS: right UE Lymphedema risk, None  SUBJECTIVE: Pt returns for her 3 month L-Dex screen.   PAIN:  Are you having pain? No  SOZO SCREENING: Patient was assessed today using the SOZO machine to determine the lymphedema index score. This was compared to her baseline score. It was determined that she is within the recommended range when compared to her baseline and no further action is needed at this time. She will continue SOZO screenings. These are done every 3 months for 2 years post operatively followed by every 6 months for 2 years, and then annually.   L-DEX FLOWSHEETS - 10/20/23 0800       L-DEX LYMPHEDEMA SCREENING   Measurement Type Unilateral    L-DEX MEASUREMENT EXTREMITY Upper Extremity    POSITION  Standing    DOMINANT SIDE Right    At Risk Side Right    BASELINE SCORE (UNILATERAL) 1.7    L-DEX SCORE (UNILATERAL) -4.1    VALUE CHANGE (UNILAT) -5.8               Hermenia Bers, PTA 10/20/2023, 8:54 AM

## 2023-10-29 DIAGNOSIS — R87810 Cervical high risk human papillomavirus (HPV) DNA test positive: Secondary | ICD-10-CM | POA: Diagnosis not present

## 2023-10-29 DIAGNOSIS — Z3202 Encounter for pregnancy test, result negative: Secondary | ICD-10-CM | POA: Diagnosis not present

## 2023-12-03 ENCOUNTER — Telehealth: Payer: Self-pay | Admitting: Adult Health

## 2023-12-03 NOTE — Telephone Encounter (Signed)
Left patient a voicemail about time change.

## 2024-01-19 ENCOUNTER — Ambulatory Visit: Payer: BC Managed Care – PPO | Attending: General Surgery

## 2024-01-19 VITALS — Wt 200.5 lb

## 2024-01-19 DIAGNOSIS — Z483 Aftercare following surgery for neoplasm: Secondary | ICD-10-CM | POA: Insufficient documentation

## 2024-01-19 NOTE — Therapy (Signed)
  OUTPATIENT PHYSICAL THERAPY SOZO SCREENING NOTE   Patient Name: Tonya Blair MRN: [id number] DOB:1976/10/07, 48 y.o., female Today's Date: 01/19/2024  PCP: Marlow Baars, MD REFERRING PROVIDER: Emelia Loron, MD   PT End of Session - 01/19/24 [phone]     Visit Number 2   # unchanged due to screen only   PT Start Time 0902    PT Stop Time 0907    PT Time Calculation (min) 5 min    Activity Tolerance Patient tolerated treatment well    Behavior During Therapy [facility] for tasks assessed/performed             Past Medical History:  Diagnosis Date   Anemia    IDA   Cancer (HCC)    right breast cancer   Family history of prostate cancer 01/02/2022   Past Surgical History:  Procedure Laterality Date   BREAST BIOPSY Right 12/25/2021   BREAST BIOPSY Left    BREAST LUMPECTOMY Right 06/19/2022   BREAST LUMPECTOMY WITH RADIOACTIVE SEED AND SENTINEL LYMPH NODE BIOPSY Right 06/19/2022   Procedure: RADIOACTIVE SEED GUIDED RIGHT BREAST LUMPECTOMY, RIGHT AXILLARY SENTINEL LYMPH NODE BIOPSY;  Surgeon: Emelia Loron, MD;  Location: [facility];  Service: General;  Laterality: Right;   PORTACATH PLACEMENT N/A 01/14/2022   Procedure: INSERTION PORT-A-CATH;  Surgeon: Emelia Loron, MD;  Location: [facility] OR;  Service: General;  Laterality: N/A;   THERAPEUTIC ABORTION     WISDOM TOOTH EXTRACTION     Patient Active Problem List   Diagnosis Date Noted   Port-A-Cath in place 04/10/2022   Anemia due to antineoplastic chemotherapy 04/10/2022   Chemotherapy induced diarrhea 03/19/2022   Genetic testing 01/18/2022   Family history of prostate cancer 01/02/2022   Malignant neoplasm of upper-outer quadrant of right breast in female, estrogen receptor positive (HCC) 12/31/2021   Cervical intraepithelial neoplasia grade 2 10/28/2019    REFERRING DIAG: right breast cancer at risk for lymphedema  THERAPY DIAG: Aftercare following surgery for neoplasm  PERTINENT HISTORY:  Completed neoadjuvant chemotherapy TCHP May 2023. Rt lumpectomy 06/19/22 showing no signs of carcinoma. 4 negative lymph node. Will be having radiation Thursday.   PRECAUTIONS: right UE Lymphedema risk, None  SUBJECTIVE: Pt returns for her 3 month L-Dex screen.   PAIN:  Are you having pain? No  SOZO SCREENING: Patient was assessed today using the SOZO machine to determine the lymphedema index score. This was compared to her baseline score. It was determined that she is within the recommended range when compared to her baseline and no further action is needed at this time. She will continue SOZO screenings. These are done every 3 months for 2 years post operatively followed by every 6 months for 2 years, and then annually.   L-DEX FLOWSHEETS - 01/19/24 0900       L-DEX LYMPHEDEMA SCREENING   Measurement Type Unilateral    L-DEX MEASUREMENT EXTREMITY Upper Extremity    POSITION  Standing    DOMINANT SIDE Right    At Risk Side Right    BASELINE SCORE (UNILATERAL) 1.7    L-DEX SCORE (UNILATERAL) 0.1    VALUE CHANGE (UNILAT) -1.6               Hermenia Bers, PTA 01/19/2024, 9:07 AM

## 2024-03-01 ENCOUNTER — Inpatient Hospital Stay: Payer: 59 | Attending: Adult Health | Admitting: Adult Health

## 2024-03-01 ENCOUNTER — Encounter: Payer: Self-pay | Admitting: Adult Health

## 2024-03-01 VITALS — BP 127/60 | HR 94 | Temp 97.6°F | Resp 18 | Ht 62.0 in | Wt 196.3 lb

## 2024-03-01 DIAGNOSIS — Z923 Personal history of irradiation: Secondary | ICD-10-CM | POA: Diagnosis not present

## 2024-03-01 DIAGNOSIS — Z170 Estrogen receptor positive status [ER+]: Secondary | ICD-10-CM | POA: Diagnosis present

## 2024-03-01 DIAGNOSIS — C50411 Malignant neoplasm of upper-outer quadrant of right female breast: Secondary | ICD-10-CM

## 2024-03-01 DIAGNOSIS — Z9221 Personal history of antineoplastic chemotherapy: Secondary | ICD-10-CM | POA: Diagnosis not present

## 2024-03-01 DIAGNOSIS — Z79810 Long term (current) use of selective estrogen receptor modulators (SERMs): Secondary | ICD-10-CM | POA: Diagnosis not present

## 2024-03-01 NOTE — Progress Notes (Unsigned)
 [facility] Cancer Follow up:    Tonya Baars, MD [address] [place] [address]   DIAGNOSIS: Cancer Staging  Malignant neoplasm of upper-outer quadrant of right breast in female, estrogen receptor positive (HCC) Staging form: Breast, AJCC 8th Edition - Clinical stage from 01/02/2022: Stage IB (cT2, cN0, cM0, G3, ER+, PR+, HER2+) - Signed by Rachel Moulds, MD on 01/02/2022 Histologic grading system: 3 grade system   SUMMARY OF ONCOLOGIC HISTORY: Oncology History  Malignant neoplasm of upper-outer quadrant of right breast in female, estrogen receptor positive (HCC)  12/31/2021 Initial Diagnosis   Malignant neoplasm of upper-outer quadrant of right breast in female, estrogen receptor positive (HCC)   01/02/2022 Cancer Staging   Staging form: Breast, AJCC 8th Edition - Clinical stage from 01/02/2022: Stage IB (cT2, cN0, cM0, G3, ER+, PR+, HER2+) - Signed by Rachel Moulds, MD on 01/02/2022 Histologic grading system: 3 grade system   01/14/2022 Genetic Testing   Negative hereditary cancer genetic testing: no pathogenic variants detected in [facility] CustomNext-Cancer +RNAinsight Panel.  The report date is January 14, 2022.  The CustomNext-Cancer+RNAinsight panel offered by [facility] includes sequencing and rearrangement analysis for the following 47 genes:  APC, ATM, AXIN2, BARD1, BMPR1A, BRCA1, BRCA2, BRIP1, CDH1, CDK4, CDKN2A, CHEK2, DICER1, EPCAM, GREM1, HOXB13, MEN1, MLH1, MSH2, MSH3, MSH6, MUTYH, NBN, NF1, NF2, NTHL1, PALB2, PMS2, POLD1, POLE, PTEN, RAD51C, RAD51D, RECQL, RET, SDHA, SDHAF2, SDHB, SDHC, SDHD, SMAD4, SMARCA4, STK11, TP53, TSC1, TSC2, and VHL.  RNA data is routinely analyzed for use in variant interpretation for all genes.   01/16/2022 - 08/14/2022 Chemotherapy   Completed chemotherapy May 2023, received 6 cycles of neoadjuvant TCHP Patient is on Treatment Plan : BREAST  Docetaxel + Carboplatin + Trastuzumab + Pertuzumab  (TCHP) q21d        01/16/2022 - 01/08/2023 Chemotherapy   Patient is on Treatment Plan : BREAST  Docetaxel + Carboplatin + Trastuzumab + Pertuzumab  (TCHP) q21d / Trastuzumab + Pertuzumab q21d     06/19/2022 Definitive Surgery   Right breast lumpectomy showed fibrosis with mild inflammation, negative for residual carcinoma, all surgical margins negative for carcinoma, right axillary sentinel lymph node negative for metastatic carcinoma, final pathologic staging [id number]   07/18/2022 - 08/09/2022 Radiation Therapy   Site Technique Total Dose (Gy) Dose per Fx (Gy) Completed Fx Beam Energies  Breast, Right: Breast_R 3D 42.56/42.56 2.66 16/16 10X, 6XFFF     07/2022 -  Anti-estrogen oral therapy   Tamoxifen     CURRENT THERAPY:Tamoxifen  INTERVAL HISTORY:  Discussed the use of AI scribe software for clinical note transcription with the patient, who gave verbal consent to proceed.  Tonya Blair 48 y.o. female   Patient Active Problem List   Diagnosis Date Noted   Port-A-Cath in place 04/10/2022   Anemia due to antineoplastic chemotherapy 04/10/2022   Chemotherapy induced diarrhea 03/19/2022   Genetic testing 01/18/2022   Family history of prostate cancer 01/02/2022   Malignant neoplasm of upper-outer quadrant of right breast in female, estrogen receptor positive (HCC) 12/31/2021   Cervical intraepithelial neoplasia grade 2 10/28/2019    is allergic to codeine and kiwi extract.  MEDICAL HISTORY: Past Medical History:  Diagnosis Date   Anemia    IDA   Cancer (HCC)    right breast cancer   Family history of prostate cancer 01/02/2022    SURGICAL HISTORY: Past Surgical History:  Procedure Laterality Date   BREAST BIOPSY Right 12/25/2021   BREAST BIOPSY Left  BREAST LUMPECTOMY Right 06/19/2022   BREAST LUMPECTOMY WITH RADIOACTIVE SEED AND SENTINEL LYMPH NODE BIOPSY Right 06/19/2022   Procedure: RADIOACTIVE SEED GUIDED RIGHT BREAST LUMPECTOMY, RIGHT AXILLARY SENTINEL LYMPH NODE BIOPSY;   Surgeon: Emelia Loron, MD;  Location: [facility];  Service: General;  Laterality: Right;   PORTACATH PLACEMENT N/A 01/14/2022   Procedure: INSERTION PORT-A-CATH;  Surgeon: Emelia Loron, MD;  Location: [facility] OR;  Service: General;  Laterality: N/A;   THERAPEUTIC ABORTION     WISDOM TOOTH EXTRACTION      SOCIAL HISTORY: Social History   Socioeconomic History   Marital status: Single    Spouse name: Not on file   Number of children: Not on file   Years of education: Not on file   Highest education level: Not on file  Occupational History   Not on file  Tobacco Use   Smoking status: Never   Smokeless tobacco: Never  Vaping Use   Vaping status: Never Used  Substance and Sexual Activity   Alcohol use: Yes    Comment: occasional wine   Drug use: Never   Sexual activity: Yes    Birth control/protection: I.U.D.    Comment: Mirena IUD  Other Topics Concern   Not on file  Social History Narrative   Not on file   Social Drivers of Health   Financial Resource Strain: Low Risk  (01/02/2022)   Overall Financial Resource Strain (CARDIA)    Difficulty of Paying Living Expenses: Not hard at all  Food Insecurity: No Food Insecurity (01/02/2022)   Hunger Vital Sign    Worried About Running Out of Food in the Last Year: Never true    Ran Out of Food in the Last Year: Never true  Transportation Needs: No Transportation Needs (01/02/2022)   PRAPARE - Administrator, Civil Service (Medical): No    Lack of Transportation (Non-Medical): No  Physical Activity: Not on file  Stress: Not on file  Social Connections: Not on file  Intimate Partner Violence: Not on file    FAMILY HISTORY: Family History  Problem Relation Age of Onset   Prostate cancer Father 52   Colon cancer Neg Hx    Colon polyps Neg Hx    Esophageal cancer Neg Hx    Rectal cancer Neg Hx    Stomach cancer Neg Hx     Review of Systems  Constitutional:  Negative for appetite change,  chills, fatigue, fever and unexpected weight change.  HENT:   Negative for hearing loss, lump/mass and trouble swallowing.   Eyes:  Negative for eye problems and icterus.  Respiratory:  Negative for chest tightness, cough and shortness of breath.   Cardiovascular:  Negative for chest pain, leg swelling and palpitations.  Gastrointestinal:  Negative for abdominal distention, abdominal pain, constipation, diarrhea, nausea and vomiting.  Endocrine: Negative for hot flashes.  Genitourinary:  Negative for difficulty urinating.   Musculoskeletal:  Negative for arthralgias.  Skin:  Negative for itching and rash.  Neurological:  Negative for dizziness, extremity weakness, headaches and numbness.  Hematological:  Negative for adenopathy. Does not bruise/bleed easily.  Psychiatric/Behavioral:  Negative for depression. The patient is not nervous/anxious.       PHYSICAL EXAMINATION    Vitals:   03/01/24 1012  BP: 127/60  Pulse: 94  Resp: 18  Temp: 97.6 F (36.4 C)  SpO2: 97%    Physical Exam Constitutional:      General: She is not in acute distress.  Appearance: Normal appearance. She is not toxic-appearing.  HENT:     Head: Normocephalic and atraumatic.     Mouth/Throat:     Mouth: Mucous membranes are moist.     Pharynx: Oropharynx is clear. No oropharyngeal exudate or posterior oropharyngeal erythema.  Eyes:     General: No scleral icterus. Cardiovascular:     Rate and Rhythm: Normal rate and regular rhythm.     Pulses: Normal pulses.     Heart sounds: Normal heart sounds.  Pulmonary:     Effort: Pulmonary effort is normal.     Breath sounds: Normal breath sounds.  Abdominal:     General: Abdomen is flat. Bowel sounds are normal. There is no distension.     Palpations: Abdomen is soft.     Tenderness: There is no abdominal tenderness.  Musculoskeletal:        General: No swelling.     Cervical back: Neck supple.  Lymphadenopathy:     Cervical: No cervical adenopathy.   Skin:    General: Skin is warm and dry.     Findings: No rash.  Neurological:     General: No focal deficit present.     Mental Status: She is alert.  Psychiatric:        Mood and Affect: Mood normal.        Behavior: Behavior normal.       ASSESSMENT and THERAPY PLAN:   No problem-specific Assessment & Plan notes found for this encounter.    All questions were answered. The patient knows to call the clinic with any problems, questions or concerns. We can certainly see the patient much sooner if necessary.  Total encounter time:*** minutes*in face-to-face visit time, chart review, lab review, care coordination, order entry, and documentation of the encounter time.   Lillard Anes, NP 03/01/24 10:42 AM Medical Oncology and Hematology [facility] [address] [place], [place] [address] Tel. [phone]    Fax. [phone]  *Total Encounter Time as defined by the Centers for Medicare and Medicaid Services includes, in addition to the face-to-face time of a patient visit (documented in the note above) non-face-to-face time: obtaining and reviewing outside history, ordering and reviewing medications, tests or procedures, care coordination (communications with other health care professionals or caregivers) and documentation in the medical record.

## 2024-03-03 NOTE — Assessment & Plan Note (Signed)
 Tonya Blair is a 48 year old woman with history of stage Ib triple positive breast cancer diagnosed in January 2023 she is status post neoadjuvant chemotherapy followed by lumpectomy, maintenance trastuzumab x , adjuvant radiation, and antiestrogen therapy with tamoxifen which began in August 2023.  Breast Cancer No signs of recurrence on physical exam. Patient is tolerating Tamoxifen well with manageable hot flashes. -Continue Tamoxifen Monday through Friday. -Obtain recent mammogram results from [facility] for review.  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 Currently under the care of a gynecologist with a plan for repeat Pap smear in May 2025. -Continue current management with gynecologist.  Cardiovascular Risk Patient has a history of cancer treatment which may increase cardiovascular risk. No current symptoms of heart disease. -Ensure annual lab panel including cholesterol is completed with gynecologist. -Encourage patient to report any unusual symptoms.  General Health Maintenance Patient is proactive in maintaining a healthy lifestyle including diet and exercise. -Encourage continuation of current health practices. -Return for follow-up in 6 months.

## 2024-03-04 ENCOUNTER — Other Ambulatory Visit: Payer: Self-pay | Admitting: Adult Health

## 2024-03-04 DIAGNOSIS — Z853 Personal history of malignant neoplasm of breast: Secondary | ICD-10-CM

## 2024-04-05 ENCOUNTER — Encounter: Payer: Self-pay | Admitting: Adult Health

## 2024-04-05 ENCOUNTER — Ambulatory Visit
Admission: RE | Admit: 2024-04-05 | Discharge: 2024-04-05 | Disposition: A | Discharge status: Home / Self Care | Source: Ambulatory Visit | Attending: Adult Health | Admitting: Adult Health

## 2024-04-05 DIAGNOSIS — Z853 Personal history of malignant neoplasm of breast: Secondary | ICD-10-CM

## 2024-04-19 ENCOUNTER — Ambulatory Visit: Payer: BC Managed Care – PPO | Attending: General Surgery

## 2024-04-19 VITALS — Wt 194.5 lb

## 2024-04-19 DIAGNOSIS — Z483 Aftercare following surgery for neoplasm: Secondary | ICD-10-CM | POA: Insufficient documentation

## 2024-04-19 NOTE — Therapy (Signed)
  OUTPATIENT PHYSICAL THERAPY SOZO SCREENING NOTE   Patient Name: Tonya Blair MRN: [id number] DOB:1976/10/04, 48 y.o., female Today's Date: 04/19/2024  PCP: Luan Rumpf, MD REFERRING PROVIDER: Enid Harry, MD   PT End of Session - 04/19/24 [phone]     Visit Number 2   # unchanged due to screen only   PT Start Time 0804    PT Stop Time 0808    PT Time Calculation (min) 4 min    Activity Tolerance Patient tolerated treatment well    Behavior During Therapy [facility] for tasks assessed/performed             Past Medical History:  Diagnosis Date   Anemia    IDA   Cancer (HCC)    right breast cancer   Family history of prostate cancer 01/02/2022   Port-A-Cath in place 04/10/2022   Past Surgical History:  Procedure Laterality Date   BREAST BIOPSY Right 12/25/2021   BREAST BIOPSY Left    BREAST LUMPECTOMY Right 06/19/2022   BREAST LUMPECTOMY WITH RADIOACTIVE SEED AND SENTINEL LYMPH NODE BIOPSY Right 06/19/2022   Procedure: RADIOACTIVE SEED GUIDED RIGHT BREAST LUMPECTOMY, RIGHT AXILLARY SENTINEL LYMPH NODE BIOPSY;  Surgeon: Enid Harry, MD;  Location: [facility];  Service: General;  Laterality: Right;   PORTACATH PLACEMENT N/A 01/14/2022   Procedure: INSERTION PORT-A-CATH;  Surgeon: Enid Harry, MD;  Location: [facility] OR;  Service: General;  Laterality: N/A;   THERAPEUTIC ABORTION     WISDOM TOOTH EXTRACTION     Patient Active Problem List   Diagnosis Date Noted   Anemia due to antineoplastic chemotherapy 04/10/2022   Chemotherapy induced diarrhea 03/19/2022   Genetic testing 01/18/2022   Family history of prostate cancer 01/02/2022   Malignant neoplasm of upper-outer quadrant of right breast in female, estrogen receptor positive (HCC) 12/31/2021   Cervical intraepithelial neoplasia grade 2 10/28/2019    REFERRING DIAG: right breast cancer at risk for lymphedema  THERAPY DIAG: Aftercare following surgery for neoplasm  PERTINENT HISTORY:  Completed neoadjuvant chemotherapy TCHP May 2023. Rt lumpectomy 06/19/22 showing no signs of carcinoma. 4 negative lymph node. Will be having radiation Thursday.   PRECAUTIONS: right UE Lymphedema risk, None  SUBJECTIVE: Pt returns for her 3 month L-Dex screen.   PAIN:  Are you having pain? No  SOZO SCREENING: Patient was assessed today using the SOZO machine to determine the lymphedema index score. This was compared to her baseline score. It was determined that she is within the recommended range when compared to her baseline and no further action is needed at this time. She will continue SOZO screenings. These are done every 3 months for 2 years post operatively followed by every 6 months for 2 years, and then annually.   L-DEX FLOWSHEETS - 04/19/24 0800       L-DEX LYMPHEDEMA SCREENING   Measurement Type Unilateral    L-DEX MEASUREMENT EXTREMITY Upper Extremity    POSITION  Standing    DOMINANT SIDE Right    At Risk Side Right    BASELINE SCORE (UNILATERAL) 1.7    L-DEX SCORE (UNILATERAL) -2.6    VALUE CHANGE (UNILAT) -4.3               Denyce Flank, PTA 04/19/2024, 8:07 AM

## 2024-07-19 ENCOUNTER — Ambulatory Visit: Attending: General Surgery

## 2024-07-19 VITALS — Wt 191.1 lb

## 2024-07-19 DIAGNOSIS — Z483 Aftercare following surgery for neoplasm: Secondary | ICD-10-CM | POA: Insufficient documentation

## 2024-07-19 NOTE — Therapy (Signed)
  OUTPATIENT PHYSICAL THERAPY SOZO SCREENING NOTE   Patient Name: Tonya Blair MRN: [id number] DOB:09/21/1976, 48 y.o., female Today's Date: 07/19/2024  PCP: Gretta Gums, MD REFERRING PROVIDER: Gretta Gums, MD   PT End of Session - 07/19/24 0803     Visit Number 2   # unchanged due to screen only   PT Start Time 0801    PT Stop Time 0805    PT Time Calculation (min) 4 min    Activity Tolerance Patient tolerated treatment well    Behavior During Therapy [facility] for tasks assessed/performed          Past Medical History:  Diagnosis Date   Anemia    IDA   Cancer (HCC)    right breast cancer   Family history of prostate cancer 01/02/2022   Port-A-Cath in place 04/10/2022   Past Surgical History:  Procedure Laterality Date   BREAST BIOPSY Right 12/25/2021   BREAST BIOPSY Left    BREAST LUMPECTOMY Right 06/19/2022   BREAST LUMPECTOMY WITH RADIOACTIVE SEED AND SENTINEL LYMPH NODE BIOPSY Right 06/19/2022   Procedure: RADIOACTIVE SEED GUIDED RIGHT BREAST LUMPECTOMY, RIGHT AXILLARY SENTINEL LYMPH NODE BIOPSY;  Surgeon: Ebbie Cough, MD;  Location: [facility];  Service: General;  Laterality: Right;   PORTACATH PLACEMENT N/A 01/14/2022   Procedure: INSERTION PORT-A-CATH;  Surgeon: Ebbie Cough, MD;  Location: [facility] OR;  Service: General;  Laterality: N/A;   THERAPEUTIC ABORTION     WISDOM TOOTH EXTRACTION     Patient Active Problem List   Diagnosis Date Noted   Anemia due to antineoplastic chemotherapy 04/10/2022   Chemotherapy induced diarrhea 03/19/2022   Genetic testing 01/18/2022   Family history of prostate cancer 01/02/2022   Malignant neoplasm of upper-outer quadrant of right breast in female, estrogen receptor positive (HCC) 12/31/2021   Cervical intraepithelial neoplasia grade 2 10/28/2019    REFERRING DIAG: right breast cancer at risk for lymphedema  THERAPY DIAG: Aftercare following surgery for neoplasm  PERTINENT HISTORY: Completed  neoadjuvant chemotherapy TCHP May 2023. Rt lumpectomy 06/19/22 showing no signs of carcinoma. 4 negative lymph node. Will be having radiation Thursday.   PRECAUTIONS: right UE Lymphedema risk, None  SUBJECTIVE: Pt returns for her first 6 month L-Dex screen.   PAIN:  Are you having pain? No  SOZO SCREENING: Patient was assessed today using the SOZO machine to determine the lymphedema index score. This was compared to her baseline score. It was determined that she is within the recommended range when compared to her baseline and no further action is needed at this time. She will continue SOZO screenings. These are done every 3 months for 2 years post operatively followed by every 6 months for 2 years, and then annually.   L-DEX FLOWSHEETS - 07/19/24 0800       L-DEX LYMPHEDEMA SCREENING   Measurement Type Unilateral    L-DEX MEASUREMENT EXTREMITY Upper Extremity    POSITION  Standing    DOMINANT SIDE Right    At Risk Side Right    BASELINE SCORE (UNILATERAL) 1.7    L-DEX SCORE (UNILATERAL) -0.4    VALUE CHANGE (UNILAT) -2.1            Aden Berwyn Caldron, PTA 07/19/2024, 8:04 AM

## 2024-09-01 ENCOUNTER — Inpatient Hospital Stay: Attending: Adult Health | Admitting: Adult Health

## 2024-09-01 VITALS — BP 111/66 | HR 97 | Temp 97.9°F | Resp 17 | Ht 62.0 in | Wt 191.5 lb

## 2024-09-01 DIAGNOSIS — Z170 Estrogen receptor positive status [ER+]: Secondary | ICD-10-CM

## 2024-09-01 DIAGNOSIS — Z17410 Hormone receptor positive with human epidermal growth factor receptor 2 positive status: Secondary | ICD-10-CM | POA: Insufficient documentation

## 2024-09-01 DIAGNOSIS — Z79810 Long term (current) use of selective estrogen receptor modulators (SERMs): Secondary | ICD-10-CM | POA: Diagnosis not present

## 2024-09-01 DIAGNOSIS — C50411 Malignant neoplasm of upper-outer quadrant of right female breast: Secondary | ICD-10-CM | POA: Insufficient documentation

## 2024-09-01 NOTE — Progress Notes (Unsigned)
 [facility] Cancer Follow up:    Tonya Gums, MD [address] [place] [address]   DIAGNOSIS:  Cancer Staging  Malignant neoplasm of upper-outer quadrant of right breast in female, estrogen receptor positive (HCC) Staging form: Breast, AJCC 8th Edition - Clinical stage from 01/02/2022: Stage IB (cT2, cN0, cM0, G3, ER+, PR+, HER2+) - Signed by Loretha Ash, MD on 01/02/2022 Histologic grading system: 3 grade system    SUMMARY OF ONCOLOGIC HISTORY: Oncology History  Malignant neoplasm of upper-outer quadrant of right breast in female, estrogen receptor positive (HCC)  12/31/2021 Initial Diagnosis   Malignant neoplasm of upper-outer quadrant of right breast in female, estrogen receptor positive (HCC)   01/02/2022 Cancer Staging   Staging form: Breast, AJCC 8th Edition - Clinical stage from 01/02/2022: Stage IB (cT2, cN0, cM0, G3, ER+, PR+, HER2+) - Signed by Loretha Ash, MD on 01/02/2022 Histologic grading system: 3 grade system   01/14/2022 Genetic Testing   Negative hereditary cancer genetic testing: no pathogenic variants detected in [facility] CustomNext-Cancer +RNAinsight Panel.  The report date is January 14, 2022.  The CustomNext-Cancer+RNAinsight panel offered by [employer] [facility] includes sequencing and rearrangement analysis for the following 47 genes:  APC, ATM, AXIN2, BARD1, BMPR1A, BRCA1, BRCA2, BRIP1, CDH1, CDK4, CDKN2A, CHEK2, DICER1, EPCAM, GREM1, HOXB13, MEN1, MLH1, MSH2, MSH3, MSH6, MUTYH, NBN, NF1, NF2, NTHL1, PALB2, PMS2, POLD1, POLE, PTEN, RAD51C, RAD51D, RECQL, RET, SDHA, SDHAF2, SDHB, SDHC, SDHD, SMAD4, SMARCA4, STK11, TP53, TSC1, TSC2, and VHL.  RNA data is routinely analyzed for use in variant interpretation for all genes.   01/16/2022 - 08/14/2022 Chemotherapy   Completed chemotherapy May 2023, received 6 cycles of neoadjuvant TCHP Patient is on Treatment Plan : BREAST  Docetaxel  + Carboplatin  + Trastuzumab  + Pertuzumab   (TCHP) q21d        01/16/2022 - 01/08/2023 Chemotherapy   Patient is on Treatment Plan : BREAST  Docetaxel  + Carboplatin  + Trastuzumab  + Pertuzumab   (TCHP) q21d / Trastuzumab  + Pertuzumab  q21d     06/19/2022 Definitive Surgery   Right breast lumpectomy showed fibrosis with mild inflammation, negative for residual carcinoma, all surgical margins negative for carcinoma, right axillary sentinel lymph node negative for metastatic carcinoma, final pathologic staging [id number]   07/18/2022 - 08/09/2022 Radiation Therapy   Site Technique Total Dose (Gy) Dose per Fx (Gy) Completed Fx Beam Energies  Breast, Right: Breast_R 3D 42.56/42.56 2.66 16/16 10X, 6XFFF     07/2022 -  Anti-estrogen oral therapy   Tamoxifen      CURRENT THERAPY: Tamoxifen   INTERVAL HISTORY:  Discussed the use of AI scribe software for clinical note transcription with the patient, who gave verbal consent to proceed.  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  She underwent neoadjuvant chemotherapy with TCHP, right breast lumpectomy, and adjuvant radiation. She is currently on tamoxifen  and experiences hot flashes as a side effect. She previously used MinoChill with black cohosh for management of hot flashes but has not reordered it.  Her most recent mammogram on April 14th, 2025, showed no mammographic evidence of malignancy, with breast density category C. She has noticed a sensation in the right breast area for the past two weeks, which is not painful but noticeable.  She is actively working on weight loss and muscle improvement, having reduced her weight from 203 pounds in September to 191 pounds currently. She maintains  a healthy diet and uses an app to monitor her food choices.     Patient Active Problem List   Diagnosis Date Noted   Anemia due to antineoplastic chemotherapy 04/10/2022   Chemotherapy  induced diarrhea 03/19/2022   Genetic testing 01/18/2022   Family history of prostate cancer 01/02/2022   Malignant neoplasm of upper-outer quadrant of right breast in female, estrogen receptor positive (HCC) 12/31/2021   Cervical intraepithelial neoplasia grade 2 10/28/2019    is allergic to codeine and kiwi extract.  MEDICAL HISTORY: Past Medical History:  Diagnosis Date   Anemia    IDA   Cancer (HCC)    right breast cancer   Family history of prostate cancer 01/02/2022   Port-A-Cath in place 04/10/2022    SURGICAL HISTORY: Past Surgical History:  Procedure Laterality Date   BREAST BIOPSY Right 12/25/2021   BREAST BIOPSY Left    BREAST LUMPECTOMY Right 06/19/2022   BREAST LUMPECTOMY WITH RADIOACTIVE SEED AND SENTINEL LYMPH NODE BIOPSY Right 06/19/2022   Procedure: RADIOACTIVE SEED GUIDED RIGHT BREAST LUMPECTOMY, RIGHT AXILLARY SENTINEL LYMPH NODE BIOPSY;  Surgeon: Ebbie Cough, MD;  Location: [facility];  Service: General;  Laterality: Right;   PORTACATH PLACEMENT N/A 01/14/2022   Procedure: INSERTION PORT-A-CATH;  Surgeon: Ebbie Cough, MD;  Location: [facility] OR;  Service: General;  Laterality: N/A;   THERAPEUTIC ABORTION     WISDOM TOOTH EXTRACTION      SOCIAL HISTORY: Social History   Socioeconomic History   Marital status: Single    Spouse name: Not on file   Number of children: Not on file   Years of education: Not on file   Highest education level: Not on file  Occupational History   Not on file  Tobacco Use   Smoking status: Never   Smokeless tobacco: Never  Vaping Use   Vaping status: Never Used  Substance and Sexual Activity   Alcohol use: Yes    Comment: occasional wine   Drug use: Never   Sexual activity: Yes    Birth control/protection: I.U.D.    Comment: Mirena IUD  Other Topics Concern   Not on file  Social History Narrative   Not on file   Social Drivers of Health   Financial Resource Strain: Low Risk  (01/02/2022)    Overall Financial Resource Strain (CARDIA)    Difficulty of Paying Living Expenses: Not hard at all  Food Insecurity: No Food Insecurity (01/02/2022)   Hunger Vital Sign    Worried About Running Out of Food in the Last Year: Never true    Ran Out of Food in the Last Year: Never true  Transportation Needs: No Transportation Needs (01/02/2022)   PRAPARE - Administrator, Civil Service (Medical): No    Lack of Transportation (Non-Medical): No  Physical Activity: Not on file  Stress: Not on file  Social Connections: Not on file  Intimate Partner Violence: Not on file    FAMILY HISTORY: Family History  Problem Relation Age of Onset   Prostate cancer Father 82   Colon cancer Neg Hx    Colon polyps Neg Hx    Esophageal cancer Neg Hx    Rectal cancer Neg Hx    Stomach cancer Neg Hx     Review of Systems  Constitutional:  Negative for appetite change, chills, fatigue, fever and unexpected weight change.  HENT:   Negative for hearing loss, lump/mass and trouble swallowing.   Eyes:  Negative for eye problems and icterus.  Respiratory:  Negative for chest tightness, cough and shortness of breath.   Cardiovascular:  Negative for chest pain, leg swelling and palpitations.  Gastrointestinal:  Negative for abdominal distention, abdominal pain, constipation, diarrhea, nausea and vomiting.  Endocrine: Positive for hot flashes.  Genitourinary:  Negative for difficulty urinating.   Musculoskeletal:  Negative for arthralgias.  Skin:  Negative for itching and rash.  Neurological:  Negative for dizziness, extremity weakness, headaches and numbness.  Hematological:  Negative for adenopathy. Does not bruise/bleed easily.  Psychiatric/Behavioral:  Negative for depression. The patient is not nervous/anxious.       PHYSICAL EXAMINATION   Onc Performance Status - 09/01/24 0856       ECOG Perf Status   ECOG Perf Status Restricted in physically strenuous activity but ambulatory and  able to carry out work of a light or sedentary nature, e.g., light house work, office work      KPS SCALE   KPS % SCORE Normal, no compliants, no evidence of disease          Vitals:   09/01/24 0852  BP: 111/66  Pulse: 97  Resp: 17  Temp: 97.9 F (36.6 C)  SpO2: 98%    Physical Exam Constitutional:      General: She is not in acute distress.    Appearance: Normal appearance. She is not toxic-appearing.  HENT:     Head: Normocephalic and atraumatic.     Mouth/Throat:     Mouth: Mucous membranes are moist.     Pharynx: Oropharynx is clear. No oropharyngeal exudate or posterior oropharyngeal erythema.  Eyes:     General: No scleral icterus. Cardiovascular:     Rate and Rhythm: Normal rate and regular rhythm.     Pulses: Normal pulses.     Heart sounds: Normal heart sounds.  Pulmonary:     Effort: Pulmonary effort is normal.     Breath sounds: Normal breath sounds.  Chest:     Comments: Right breast s/p lumpectomy and radiation, no sign of local recurrence, left breast benign Abdominal:     General: Abdomen is flat. Bowel sounds are normal. There is no distension.     Palpations: Abdomen is soft.     Tenderness: There is no abdominal tenderness.  Musculoskeletal:        General: No swelling.     Cervical back: Neck supple.  Lymphadenopathy:     Cervical: No cervical adenopathy.     Upper Body:     Right upper body: No supraclavicular or axillary adenopathy.     Left upper body: No supraclavicular or axillary adenopathy.  Skin:    General: Skin is warm and dry.     Findings: No rash.  Neurological:     General: No focal deficit present.     Mental Status: She is alert.  Psychiatric:        Mood and Affect: Mood normal.        Behavior: Behavior normal.      ASSESSMENT and THERAPY PLAN:   Assessment and Plan Assessment & Plan Right breast invasive ductal carcinoma, ERPR/HER2 positive Status post neoadjuvant chemotherapy, lumpectomy, adjuvant radiation,  and ongoing tamoxifen  therapy. Mammogram on April 05, 2024, showed no malignancy. Discussed Guardant Reveal blood testing for recurrence detection.  - Continue tamoxifen  therapy. - Continue annual mammograms. - Consider Guardant Reveal blood testing every six months; she will decide and inform via MyChart.  Right breast post-lumpectomy and radiation-associated musculoskeletal and scar tissue pain Intermittent pain likely musculoskeletal  and related to scar tissue and radiation therapy. Common post-radiation issue. - Monitor pain and report if it worsens. - Consider x-rays if pain increases or persists.  RTC in 6 months for f/u.     All questions were answered. The patient knows to call the clinic with any problems, questions or concerns. We can certainly see the patient much sooner if necessary.  Total encounter time:30 minutes*in face-to-face visit time, chart review, lab review, care coordination, order entry, and documentation of the encounter time.    Morna Kendall, NP 09/01/24 9:35 AM Medical Oncology and Hematology [facility] [address] [place], [place] [address] Tel. [phone]    Fax. [phone]  *Total Encounter Time as defined by the Centers for Medicare and Medicaid Services includes, in addition to the face-to-face time of a patient visit (documented in the note above) non-face-to-face time: obtaining and reviewing outside history, ordering and reviewing medications, tests or procedures, care coordination (communications with other health care professionals or caregivers) and documentation in the medical record.

## 2024-09-06 ENCOUNTER — Ambulatory Visit: Payer: 59 | Admitting: Hematology and Oncology

## 2024-11-24 ENCOUNTER — Other Ambulatory Visit: Payer: Self-pay | Admitting: Hematology and Oncology

## 2025-01-03 ENCOUNTER — Ambulatory Visit: Attending: General Surgery

## 2025-01-03 VITALS — Wt 190.1 lb

## 2025-01-03 DIAGNOSIS — Z483 Aftercare following surgery for neoplasm: Secondary | ICD-10-CM | POA: Insufficient documentation

## 2025-01-03 NOTE — Therapy (Signed)
" °  OUTPATIENT PHYSICAL THERAPY SOZO SCREENING NOTE   Patient Name: Tonya Blair MRN: [id number] DOB:03/09/1976, 49 y.o., female Today's Date: 01/03/2025  PCP: Gretta Gums, MD REFERRING PROVIDER: Ebbie Cough, MD   PT End of Session - 01/03/25 0803     Visit Number 2   # unchanged due to screen only   PT Start Time 0801    PT Stop Time 0805    PT Time Calculation (min) 4 min    Activity Tolerance Patient tolerated treatment well    Behavior During Therapy [facility] for tasks assessed/performed          Past Medical History:  Diagnosis Date   Anemia    IDA   Cancer (HCC)    right breast cancer   Family history of prostate cancer 01/02/2022   Port-A-Cath in place 04/10/2022   Past Surgical History:  Procedure Laterality Date   BREAST BIOPSY Right 12/25/2021   BREAST BIOPSY Left    BREAST LUMPECTOMY Right 06/19/2022   BREAST LUMPECTOMY WITH RADIOACTIVE SEED AND SENTINEL LYMPH NODE BIOPSY Right 06/19/2022   Procedure: RADIOACTIVE SEED GUIDED RIGHT BREAST LUMPECTOMY, RIGHT AXILLARY SENTINEL LYMPH NODE BIOPSY;  Surgeon: Ebbie Cough, MD;  Location: [facility];  Service: General;  Laterality: Right;   PORTACATH PLACEMENT N/A 01/14/2022   Procedure: INSERTION PORT-A-CATH;  Surgeon: Ebbie Cough, MD;  Location: [facility] OR;  Service: General;  Laterality: N/A;   THERAPEUTIC ABORTION     WISDOM TOOTH EXTRACTION     Patient Active Problem List   Diagnosis Date Noted   Genetic testing 01/18/2022   Family history of prostate cancer 01/02/2022   Malignant neoplasm of upper-outer quadrant of right breast in female, estrogen receptor positive (HCC) 12/31/2021   Cervical intraepithelial neoplasia grade 2 10/28/2019    REFERRING DIAG: right breast cancer at risk for lymphedema  THERAPY DIAG: Aftercare following surgery for neoplasm  PERTINENT HISTORY: Completed neoadjuvant chemotherapy TCHP May 2023. Rt lumpectomy 06/19/22 showing no signs of carcinoma. 4  negative lymph node. Will be having radiation Thursday.   PRECAUTIONS: right UE Lymphedema risk, None  SUBJECTIVE: Pt returns for her 6 month L-Dex screen.   PAIN:  Are you having pain? No  SOZO SCREENING: Patient was assessed today using the SOZO machine to determine the lymphedema index score. This was compared to her baseline score. It was determined that she is within the recommended range when compared to her baseline and no further action is needed at this time. She will continue SOZO screenings. These are done every 3 months for 2 years post operatively followed by every 6 months for 2 years, and then annually.   L-DEX FLOWSHEETS - 01/03/25 0800       L-DEX LYMPHEDEMA SCREENING   Measurement Type Unilateral    L-DEX MEASUREMENT EXTREMITY Upper Extremity    POSITION  Standing    DOMINANT SIDE Right    At Risk Side Right    BASELINE SCORE (UNILATERAL) 1.7    L-DEX SCORE (UNILATERAL) 0.4    VALUE CHANGE (UNILAT) -1.3         P: Cont every 6 month L-Dex screens until 05/2026.   Aden Berwyn Caldron, PTA 01/03/2025, 8:06 AM    "

## 2025-03-01 ENCOUNTER — Inpatient Hospital Stay: Admitting: Hematology and Oncology

## 2025-07-04 ENCOUNTER — Ambulatory Visit
